# Patient Record
Sex: Male | Born: 1990 | Race: Black or African American | Hispanic: No | Marital: Single | Smoking: Current some day smoker
Health system: Southern US, Community
[De-identification: ages and names within clinical notes are randomized; demographics above are authoritative.]

## PROBLEM LIST (undated history)

## (undated) DIAGNOSIS — F209 Schizophrenia, unspecified: Secondary | ICD-10-CM

---
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
## 2011-10-28 ENCOUNTER — Emergency Department: Payer: Self-pay | Admitting: Emergency Medicine

## 2011-10-28 LAB — COMPREHENSIVE METABOLIC PANEL
Anion Gap: 9 (ref 7–16)
BUN: 13 mg/dL (ref 7–18)
Bilirubin,Total: 0.9 mg/dL (ref 0.2–1.0)
Chloride: 106 mmol/L (ref 98–107)
Co2: 25 mmol/L (ref 21–32)
Creatinine: 1.26 mg/dL (ref 0.60–1.30)
EGFR (African American): >60
EGFR (Non-African Amer.): >60
Osmolality: 279 (ref 275–301)
Potassium: 4.4 mmol/L (ref 3.5–5.1)
Sodium: 140 mmol/L (ref 136–145)
Total Protein: 7.9 g/dL (ref 6.4–8.2)

## 2011-10-28 LAB — CBC
HCT: 49.5 % (ref 40.0–52.0)
MCV: 90 fL (ref 80–100)
Platelet: 195 10*3/uL (ref 150–440)
RBC: 5.53 10*6/uL (ref 4.40–5.90)
RDW: 13.2 % (ref 11.5–14.5)
WBC: 5.1 10*3/uL (ref 3.8–10.6)

## 2011-10-28 LAB — URINALYSIS, COMPLETE
Glucose,UR: NEGATIVE mg/dL (ref 0–75)
Nitrite: NEGATIVE
Ph: 7 (ref 4.5–8.0)
Protein: NEGATIVE
RBC,UR: 1 /HPF (ref 0–5)
Specific Gravity: 1.02 (ref 1.003–1.030)
WBC UR: 6 /HPF (ref 0–5)

## 2012-12-20 ENCOUNTER — Emergency Department: Payer: Self-pay | Admitting: Emergency Medicine

## 2012-12-20 LAB — RAPID INFLUENZA A&B ANTIGENS

## 2012-12-20 LAB — URINALYSIS, COMPLETE
Blood: NEGATIVE
Glucose,UR: NEGATIVE mg/dL (ref 0–75)
Hyaline Cast: 2
Ph: 6 (ref 4.5–8.0)
Protein: 100
RBC,UR: 1 /HPF (ref 0–5)
Specific Gravity: 1.031 (ref 1.003–1.030)
Squamous Epithelial: NONE SEEN

## 2012-12-20 LAB — COMPREHENSIVE METABOLIC PANEL
BUN: 11 mg/dL (ref 7–18)
Bilirubin,Total: 0.6 mg/dL (ref 0.2–1.0)
Chloride: 100 mmol/L (ref 98–107)
Co2: 28 mmol/L (ref 21–32)
EGFR (African American): >60
Osmolality: 274 (ref 275–301)
Sodium: 136 mmol/L (ref 136–145)

## 2012-12-20 LAB — DRUG SCREEN, URINE
Benzodiazepine, Ur Scrn: NEGATIVE (ref ?–200)
Cannabinoid 50 Ng, Ur ~~LOC~~: POSITIVE (ref ?–50)
Cocaine Metabolite,Ur ~~LOC~~: POSITIVE (ref ?–300)
MDMA (Ecstasy)Ur Screen: NEGATIVE (ref ?–500)
Methadone, Ur Screen: NEGATIVE (ref ?–300)
Opiate, Ur Screen: NEGATIVE (ref ?–300)
Tricyclic, Ur Screen: NEGATIVE (ref ?–1000)

## 2012-12-20 LAB — MONONUCLEOSIS SCREEN: Mono Test: NEGATIVE

## 2012-12-20 LAB — CBC
Platelet: 156 10*3/uL (ref 150–440)
RDW: 13.2 % (ref 11.5–14.5)
WBC: 6.9 10*3/uL (ref 3.8–10.6)

## 2012-12-20 LAB — CK: CK, Total: 71 U/L (ref 35–232)

## 2012-12-20 LAB — SALICYLATE LEVEL: Salicylates, Serum: 2.1 mg/dL

## 2012-12-20 LAB — ACETAMINOPHEN LEVEL: Acetaminophen: <2 ug/mL

## 2012-12-22 LAB — URINE CULTURE

## 2012-12-25 LAB — CULTURE, BLOOD (SINGLE)

## 2013-03-11 ENCOUNTER — Emergency Department: Payer: Self-pay | Admitting: Emergency Medicine

## 2014-06-13 NOTE — Consult Note (Signed)
PATIENT NAME:  Bradley Casey, Bradley Casey MR#:  [id number] DATE OF BIRTH:  1990/11/19  DATE OF CONSULTATION:  12/20/2012  REFERRING PHYSICIAN:  Dr. Ella Bodo. CONSULTING PHYSICIAN:  Felipa Furnace, MD  CHIEF COMPLAINT: Weakness and tiredness.   HISTORY OF PRESENT ILLNESS: [name] is a very nice 24 year old gentleman who has history of being healthy, but he uses multiple amounts of drugs and alcohol. He came to the ER with a history of sleeping for 17 to 18 hours yesterday, waking up very thirsty and having significant chills. Apparently, the patient is very active. He uses cocaine on a regular basis, and that actually makes him very awake, but that night he was concerned for going to bed for this long. Whenever he woke up, he was dizzy, lightheaded, and again, very thirsty, for which he felt like something was not normal and decided to come in to the ER.  In the ER, he was evaluated by Dr. Sharma Covert, who wanted to bring him in due to his creatinine of 1.68 and his use of drugs. He was previously seen over here, and his creatinine was 1.26.   The patient states that he does not drink water. He does not really hydrate himself. He is always on the go because he does not have a house. He stays at night with multiple friends, probably 6 houses that he frequents, and he has been doing cocaine and marijuana on a regular basis. The patient does not have a sick contact. He does not have a recent travel history.  He does not have any other major symptoms, for which I cannot pinpoint an infectious site, but this could be a viral syndrome. His white count is normal. He had a normal temperature when he arrived, but it spiked to 102. He does have white blood cells in his urine with trace leukocyte esterase, so suspicion of urinary tract infection versus urethritis from gonorrhea or chlamydia, are possible options. I asked the nurse to do a flu swab.  Overall, the patient does not really want to stay over here; he just  wanted to have some reassurance to go back home. At this moment, he does not meet any significant criteria for admission, for which we are going to discharge him with general recommendations. We had a long talk about the drug use and alcohol use.   REVIEW OF SYSTEMS:  A twelve-system review of systems done. CONSTITUTIONAL: Positive chills. He was not aware of fever, but he did have a fever of 102. Positive fatigue and weakness for the past 24 to 48 hours.  EYES:  No blurry vision, double vision, pain, redness.  EARS, NOSE, THROAT: No difficulty swallowing. No ear pain. No epistaxis.  RESPIRATORY: No cough, wheezing or hemoptysis. No painful respirations.  CARDIOVASCULAR: No chest pain, orthopnea, syncope or edema.  GASTROINTESTINAL: No nausea, vomiting, abdominal pain, constipation or diarrhea.  GENITOURINARY: No dysuria, hematuria or changes in urinary frequency.  ENDOCRINE: No polyuria, polydipsia or polyphagia. His blood sugar is 150, but that could be secondary to stress.  HEMATOLOGIC AND LYMPHATIC: No anemia, easy bruising or swollen glands.  SKIN: No rash or petechiae. The patient is sweaty due to the fever.  MUSCULOSKELETAL: No significant neck pain, back pain or joint pain.  NEUROLOGIC: No numbness, tingling or vertigo.  PSYCHIATRIC: No insomnia or depression.   PAST MEDICAL HISTORY:  Positive for polysubstance abuse.   ALLERGIES: No known drug allergies.   PAST SURGICAL HISTORY: None.   FAMILY HISTORY: Positive for diabetes  in his grandmother. No MI. No CVA. No cancer.   MEDICATIONS: None.   SOCIAL HISTORY: The patient smokes marijuana daily. He uses cocaine very often, in powder form. He does not inject himself. No previous history of IV drug abuse. Positive MDMA use. He lives in at least 4 or 5 different houses, moves here and there. He does not have a house. He is technically homeless, but he always has a roof where to stay. He does not work,   PHYSICAL EXAMINATION: VITAL  SIGNS: Blood pressure 135/75, pulse 101 to 104, respirations 18, temperature 97.8. On admission, 102. After 1 gram of Tylenol, temperature is coming down to 98 to 99.  GENERAL: The patient is alert and oriented x 3, in no acute respiratory distress. Hemodynamically stable.  HEENT: His pupils are equal, round and reactive. Extraocular movements are intact. Mucosa is moist. Anicteric sclerae. Pink conjunctivae. No oral lesions. No oropharyngeal exudates.  NECK: Supple. No JVD. No thyromegaly. No adenopathy. No carotid bruits.  CARDIOVASCULAR: Regular rate and rhythm. No murmurs, rubs or gallops.  LUNGS: Clear without any wheezing or crepitus. No use of accessory muscles.  ABDOMEN: Soft, nontender, nondistended. No hepatosplenomegaly. No masses. Bowel sounds are positive.  GENITOURINARY: Deferred.  EXTREMITIES: No edema, cyanosis or clubbing.  SKIN: No rashes or petechiae. The skin is sweaty/diaphoretic, but no rashes. Turgor is normal.  LYMPHATICS: There is no lymphadenopathy in the neck, supraclavicular, axillary or epitrochlear.  MUSCULOSKELETAL: No joint effusion or joint swelling.  NEUROLOGIC: Cranial nerves intact. No focal findings.  PSYCHIATRIC: The patient is very pleasant. He is not in any significant agitation or changes in  mood.   LABORATORY DATA: Blood sugar 150. Creatinine 1.68, previous creatinine 1.26. Potassium 3.3. LFTs within normal limits. Total CK 71. Cocaine positive. Marijuana positive. White count 6.9, hemoglobin 16. White blood cells 21 in the urine, trace leukocyte esterase. Salicylates is negative. Tylenol level negative.  ASSESSMENT AND PLAN: A 24 year old gentleman with history of malaise and fever.  1.  Fever. This likely is a viral syndrome. Other considerations are urinary tract infection, ureteritis, for which we recommend to treat with appropriate antibiotics. He did receive 1 gram of Rocephin, which should be treatment already gonococcal infection like ureteritis.   He could receive 1 gram of azithromycin x 1 to treat any possible chlamydia. At discharge, he could go with Keflex or Cipro as treatment of urinary tract infection. Other possibility is viral syndrome. The patient is hemodynamically stable. No signs of toxic symptoms. No signs of sepsis. He is tachycardic, likely due to dehydration and cocaine use. His creatinine is elevated at 1.68. His last creatinine is 1.28, but it could be different from baseline now since he has been doing cocaine. Cocaine could create kidney failure. MDMA can cause failure as well, as well as PCP.  He needs to hydrate himself very well.  2. As far as his high glucose goes, this is likely stress but the patient can be rechecked as an outpatient .     RECOMMENDATIONS:   1.  Tylenol p.r.n. fever. Start Tamiflu if flu swab is positive.  2.  Replace potassium.  3.  Drug counseling given. The patient agreed to at least slow down.   I spent about 45 minutes with this patient. He can be discharged home as long as he is hemodynamically stable.    ____________________________ Felipa Furnaceoberto Sanchez Gutierrez, MD rsg:dmm D: 12/20/2012 18:46:00 ET T: 12/20/2012 19:18:23 ET JOB#: [id number]  cc: Felipa Furnaceoberto Sanchez Gutierrez, MD, <Dictator>  Pearletha Furl MD ELECTRONICALLY SIGNED 01/04/2013 23:48

## 2014-08-10 ENCOUNTER — Emergency Department
Admission: EM | Admit: 2014-08-10 | Discharge: 2014-08-10 | Disposition: A | Discharge status: Home / Self Care | Payer: Managed Care, Other (non HMO) | Attending: Emergency Medicine | Admitting: Emergency Medicine

## 2014-08-10 ENCOUNTER — Encounter: Payer: Self-pay | Admitting: Emergency Medicine

## 2014-08-10 DIAGNOSIS — N341 Nonspecific urethritis: Secondary | ICD-10-CM | POA: Diagnosis not present

## 2014-08-10 DIAGNOSIS — M545 Low back pain: Secondary | ICD-10-CM | POA: Diagnosis present

## 2014-08-10 LAB — URINALYSIS COMPLETE WITH MICROSCOPIC (ARMC ONLY)
BILIRUBIN URINE: NEGATIVE
GLUCOSE, UA: NEGATIVE mg/dL
HGB URINE DIPSTICK: NEGATIVE
Ketones, ur: NEGATIVE mg/dL
NITRITE: NEGATIVE
Protein, ur: NEGATIVE mg/dL
SPECIFIC GRAVITY, URINE: 1.014 (ref 1.005–1.030)
SQUAMOUS EPITHELIAL / LPF: NONE SEEN
pH: 8 (ref 5.0–8.0)

## 2014-08-10 LAB — CHLAMYDIA/NGC RT PCR (ARMC ONLY)
CHLAMYDIA TR: NOT DETECTED
N gonorrhoeae: NOT DETECTED

## 2014-08-10 MED ORDER — CEFTRIAXONE SODIUM 250 MG IJ SOLR
INTRAMUSCULAR | Status: AC
  Administered 2014-08-10: 250 mg via INTRAMUSCULAR
  Filled 2014-08-10: qty 250

## 2014-08-10 MED ORDER — CEFTRIAXONE SODIUM 250 MG IJ SOLR
250.0000 mg | Freq: Once | INTRAMUSCULAR | Status: AC
  Administered 2014-08-10: 250 mg via INTRAMUSCULAR

## 2014-08-10 MED ORDER — AZITHROMYCIN 250 MG PO TABS
1000.0000 mg | ORAL_TABLET | Freq: Once | ORAL | Status: AC
  Administered 2014-08-10: 1000 mg via ORAL

## 2014-08-10 MED ORDER — AZITHROMYCIN 250 MG PO TABS
ORAL_TABLET | ORAL | Status: AC
  Administered 2014-08-10: 1000 mg via ORAL
  Filled 2014-08-10: qty 4

## 2014-08-10 MED ORDER — LIDOCAINE HCL (PF) 1 % IJ SOLN
INTRAMUSCULAR | Status: AC
  Administered 2014-08-10: 0.9 mL
  Filled 2014-08-10: qty 5

## 2014-08-10 NOTE — ED Provider Notes (Signed)
[facility]  ____________________________________________  Time seen: 13:07 PM  I have reviewed the triage vital signs and the nursing notes.   HISTORY  Chief Complaint Back Pain    HPI Bradley Casey is a 24 y.o. male is here today with complaint of right "kidney pain". He states that yesterday his urine was cloudy. He has some frequency after drinking a beer. He denies any penile discharge but does admit that he had unprotected sex with a new partner a couple weeks ago. When talking about his flank pain and he points to the posterior lower back. There is been no history of injury. He denies any nausea, vomiting, or fever. In the past has been told that he had possible "kidney failure" but was never referred to a specialist for this. Currently he states his pain is 2 out of 10.   History reviewed. No pertinent past medical history.  There are no active problems to display for this patient.   History reviewed. No pertinent past surgical history.  No current outpatient prescriptions on file.  Allergies Review of patient's allergies indicates no known allergies.  No family history on file.  Social History History  Substance Use Topics  . Smoking status: Never Smoker   . Smokeless tobacco: Not on file  . Alcohol Use: Yes    Review of Systems Constitutional: No fever/chills Eyes: No visual changes. ENT: No sore throat. Cardiovascular: Denies chest pain. Respiratory: Denies shortness of breath. Gastrointestinal: No abdominal pain.  No nausea, no vomiting.  No diarrhea.  No constipation. Genitourinary: Positive for urinary frequency yesterday after drinking beer. Musculoskeletal: Positive for right lower back pain Skin: Negative for rash. Neurological: Negative for headaches, focal weakness or numbness.  10-point ROS otherwise negative.  ____________________________________________   PHYSICAL EXAM:  VITAL  SIGNS: ED Triage Vitals  Enc Vitals Group     BP 08/10/14 1158 129/81 mmHg     Pulse Rate 08/10/14 1158 70     Resp --      Temp 08/10/14 1158 98 F (36.7 C)     Temp Source 08/10/14 1158 Oral     SpO2 08/10/14 1158 100 %     Weight 08/10/14 1158 160 lb (72.576 kg)     Height 08/10/14 1158  (1.803 m)     Head Cir --      Peak Flow --      Pain Score --      Pain Loc --      Pain Edu? --      Excl. in GC? --     Constitutional: Alert and oriented. Well appearing and in no acute distress. Eyes: Conjunctivae are normal. PERRL. EOMI. Head: Atraumatic. Nose: No congestion/rhinnorhea. Neck: No stridor.   Cardiovascular: Normal rate, regular rhythm. Grossly normal heart sounds.  Good peripheral circulation. Respiratory: Normal respiratory effort.  No retractions. Lungs CTAB. Gastrointestinal: Soft and nontender. No distention. No CVA tenderness. Musculoskeletal: Mild tenderness right lower paravertebral muscles. No deformity noted and no restrictions on range of motion. Straight leg raises are negative. No lower extremity tenderness nor edema.  No joint effusions. Neurologic:  Normal speech and language. No gross focal neurologic deficits are appreciated. Speech is normal. No gait instability. Skin:  Skin is warm, dry and intact. No rash noted. Psychiatric: Mood and affect are normal. Speech and behavior are normal.  ____________________________________________   LABS (all labs ordered are listed, but only abnormal results are displayed)  Labs Reviewed  URINALYSIS  COMPLETEWITH MICROSCOPIC ([facility] ONLY) - Abnormal; Notable for the following:    Color, Urine STRAW (*)    APPearance CLEAR (*)    Leukocytes, UA 1+ (*)    Bacteria, UA RARE (*)    All other components within normal limits  CHLAMYDIA/NGC RT PCR ([facility] ONLY)   ____________________________________________ PROCEDURES  Procedure(s) performed: None  Critical Care performed:  No  ____________________________________________   INITIAL IMPRESSION / ASSESSMENT AND PLAN / ED COURSE  Pertinent labs & imaging results that were available during my care of the patient were reviewed by me and considered in my medical decision making (see chart for details).  Patient was treated for urethritis secondary to STD. Patient was informed that his gonorrhea and chlamydia test would not be back for another 3 hours. He is aware that he is getting the antibiotic so and needs to follow-up after 10 days ____________________________________________   FINAL CLINICAL IMPRESSION(S) / ED DIAGNOSES  Final diagnoses:  Urethritis, nonspecific      Tommi Rumps, PA-C 08/10/14 1529  Tommi Rumps, PA-C 08/10/14 1532  Minna Antis, MD 08/11/14 Barry Brunner

## 2014-08-10 NOTE — ED Notes (Signed)
Patient presents to the ED with slight "tightness" in his lower back and cloudy urine since yesterday.  Patient reports history of UTI and kidney problems.  Patient is in no obvious distress at this time.  Patient texting on cell phone during triage.  Patient states he is most concerned with his cloudy urine.

## 2014-08-10 NOTE — ED Notes (Signed)
Pt complaining of right flank pain/ kidney pain. Pt concerned of possible kidney failure at prior exam years ago they stated he was having kidney problems, pt states his urine was cloudy yesterday , denies any other urinary symptoms , no penile discharge , does admit to having unprotected sex with a new partner " a couple of weeks ago"

## 2014-08-10 NOTE — Discharge Instructions (Signed)
Urethritis °Urethritis is an inflammation of the tube through which urine exits your bladder (urethra).  °CAUSES °Urethritis is often caused by an infection in your urethra. The infection can be viral, like herpes. The infection can also be bacterial, like gonorrhea. °RISK FACTORS °Risk factors of urethritis include: °· Having sex without using a condom. °· Having multiple sexual partners. °· Having poor hygiene. °SIGNS AND SYMPTOMS °Symptoms of urethritis are less noticeable in women than in men. These symptoms include: °· Burning feeling when you urinate (dysuria). °· Discharge from your urethra. °· Blood in your urine (hematuria). °· Urinating more than usual. °DIAGNOSIS  °To confirm a diagnosis of urethritis, your health care provider will do the following: °· Ask about your sexual history. °· Perform a physical exam. °· Have you provide a sample of your urine for lab testing. °· Use a cotton swab to gently collect a sample from your urethra for lab testing. °TREATMENT  °It is important to treat urethritis. Depending on the cause, untreated urethritis may lead to serious genital infections and possibly infertility. Urethritis caused by a bacterial infection is treated with antibiotic medicine. All sexual partners must be treated.  °HOME CARE INSTRUCTIONS °· Do not have sex until the test results are known and treatment is completed, even if your symptoms go away before you finish treatment. °· If you were prescribed an antibiotic, finish it all even if you start to feel better. °SEEK MEDICAL CARE IF:  °· Your symptoms are not improved in 3 days. °· Your symptoms are getting worse. °· You develop abdominal pain or pelvic pain (in women). °· You develop joint pain. °· You have a fever. °SEEK IMMEDIATE MEDICAL CARE IF:  °· You have severe pain in the belly, back, or side. °· You have repeated vomiting. °MAKE SURE YOU: °· Understand these instructions. °· Will watch your condition. °· Will get help right away if you  are not doing well or get worse. °Document Released: 08/03/2000 Document Revised: 06/24/2013 Document Reviewed: 10/08/2012 °[employer]® Patient Information ©2015 [employer]. This information is not intended to replace advice given to you by your health care provider. Make sure you discuss any questions you have with your health care provider. ° °

## 2015-01-04 ENCOUNTER — Ambulatory Visit
Admission: EM | Admit: 2015-01-04 | Discharge: 2015-01-04 | Disposition: A | Discharge status: Home / Self Care | Payer: Managed Care, Other (non HMO) | Attending: Family Medicine | Admitting: Family Medicine

## 2015-01-04 ENCOUNTER — Ambulatory Visit (INDEPENDENT_AMBULATORY_CARE_PROVIDER_SITE_OTHER): Payer: Managed Care, Other (non HMO)

## 2015-01-04 ENCOUNTER — Encounter: Payer: Self-pay | Admitting: *Deleted

## 2015-01-04 DIAGNOSIS — S8010XA Contusion of unspecified lower leg, initial encounter: Secondary | ICD-10-CM | POA: Diagnosis not present

## 2015-01-04 DIAGNOSIS — S40029A Contusion of unspecified upper arm, initial encounter: Secondary | ICD-10-CM

## 2015-01-04 DIAGNOSIS — S51821A Laceration with foreign body of right forearm, initial encounter: Secondary | ICD-10-CM | POA: Diagnosis not present

## 2015-01-04 MED ORDER — CEFUROXIME AXETIL 500 MG PO TABS: 500.0000 mg | ORAL_TABLET | Freq: Two times a day (BID) | ORAL | Status: DC

## 2015-01-04 MED ORDER — MUPIROCIN 2 % EX OINT: 1.0000 "application " | TOPICAL_OINTMENT | Freq: Three times a day (TID) | CUTANEOUS | Status: DC
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
## 2015-01-04 NOTE — Discharge Instructions (Signed)
Contusion A contusion is a deep bruise. Contusions happen when an injury causes bleeding under the skin. Symptoms of bruising include pain, swelling, and discolored skin. The skin may turn blue, purple, or yellow. HOME CARE   Rest the injured area.  If told, put ice on the injured area.  Put ice in a plastic bag.  Place a towel between your skin and the bag.  Leave the ice on for 20 minutes, 2-3 times per day.  If told, put light pressure (compression) on the injured area using an elastic bandage. Make sure the bandage is not too tight. Remove it and put it back on as told by your doctor.  If possible, raise (elevate) the injured area above the level of your heart while you are sitting or lying down.  Take over-the-counter and prescription medicines only as told by your doctor. GET HELP IF:  Your symptoms do not get better after several days of treatment.  Your symptoms get worse.  You have trouble moving the injured area. GET HELP RIGHT AWAY IF:   You have very bad pain.  You have a loss of feeling (numbness) in a hand or foot.  Your hand or foot turns pale or cold.   This information is not intended to replace advice given to you by your health care provider. Make sure you discuss any questions you have with your health care provider.   Document Released: 07/27/2007 Document Revised: 10/29/2014 Document Reviewed: 06/25/2014 Elsevier Interactive Patient Education 2016 [employer]  Delayed Wound Closure Sometimes, your health care provider will decide to delay closing a wound for several days. This is done when the wound is badly bruised, dirty, or when it has been several hours since the injury happened. By delaying the closure of your wound, the risk of infection is reduced. Wounds that are closed in 3-7 days after being cleaned up and dressed heal just as well as those that are closed right away. HOME CARE INSTRUCTIONS  Rest and elevate the injured area until the pain  and swelling are gone.  Have your wound checked as instructed by your health care provider. SEEK MEDICAL CARE IF:  You develop unusual or increased swelling or redness around the wound.  You have increasing pain or tenderness.  There is increasing fluid (drainage) or a bad smelling drainage coming from the wound.   This information is not intended to replace advice given to you by your health care provider. Make sure you discuss any questions you have with your health care provider.   Document Released: 02/07/2005 Document Revised: 02/12/2013 Document Reviewed: 08/07/2012 Elsevier Interactive Patient Education 2016 [employer]  Nonsutured Laceration Care A laceration is a cut that goes through all layers of the skin and extends into the tissue that is right under the skin. This type of cut is usually stitched up (sutured) or closed with tape (adhesive strips) or skin glue shortly after the injury happens. However, if the wound is dirty or if several hours pass before medical treatment is provided, it is likely that germs (bacteria) will enter the wound. Closing a laceration after bacteria have entered it increases the risk of infection. In these cases, your health care provider may leave the laceration open (nonsutured) and cover it with a bandage. This type of treatment helps prevent infection and allows the wound to heal from the deepest layer of tissue damage up to the surface. An open fracture is a type of injury that may involve nonsutured lacerations. An  open fracture is a break in a bone that happens along with one or more lacerations through the skin that is near the fracture site. HOW TO CARE FOR YOUR NONSUTURED LACERATION  Take or apply over-the-counter and prescription medicines only as told by your health care provider.  If you were prescribed an antibiotic medicine, take or apply it as told by your health care provider. Do not stop using the antibiotic even if your condition  improves.  Clean the wound one time each day or as told by your health care provider.  Wash the wound with mild soap and water.  Rinse the wound with water to remove all soap.  Pat your wound dry with a clean towel. Do not rub the wound.  Do not inject anything into the wound unless your health care provider told you to.  Change any bandages (dressings) as told by your health care provider. This includes changing the dressing if it gets wet, dirty, or starts to smell bad.  Keep the dressing dry until your health care provider says it can be removed. Do not take baths, swim, or do anything that puts your wound underwater until your health care provider approves.  Raise (elevate) the injured area above the level of your heart while you are sitting or lying down, if possible.  Do not scratch or pick at the wound.  Check your wound every day for signs of infection. Watch for:  Redness, swelling, or pain.  Fluid, blood, or pus.  Keep all follow-up visits as told by your health care provider. This is important. SEEK MEDICAL CARE IF:  You received a tetanus and shot and you have swelling, severe pain, redness, or bleeding at the injection site.   You have a fever.  Your pain is not controlled with medicine.  You have increased redness, swelling, or pain at the site of your wound.  You have fluid, blood, or pus coming from your wound.  You notice a bad smell coming from your wound or your dressing.  You notice something coming out of the wound, such as wood or glass.  You notice a change in the color of your skin near your wound.  You develop a new rash.  You need to change the dressing frequently due to fluid, blood, or pus draining from the wound.  You develop numbness around your wound. SEEK IMMEDIATE MEDICAL CARE IF:  Your pain suddenly increases and is severe.  You develop severe swelling around the wound.  The wound is on your hand or foot and you cannot  properly move a finger or toe.  The wound is on your hand or foot and you notice that your fingers or toes look pale or bluish.  You have a red streak going away from your wound.   This information is not intended to replace advice given to you by your health care provider. Make sure you discuss any questions you have with your health care provider.   Document Released: 01/05/2006 Document Revised: 06/24/2014 Document Reviewed: 02/03/2014 Elsevier Interactive Patient Education [employer]

## 2015-07-14 ENCOUNTER — Encounter: Payer: Self-pay | Admitting: *Deleted

## 2015-07-14 ENCOUNTER — Emergency Department
Admission: EM | Admit: 2015-07-14 | Discharge: 2015-07-14 | Disposition: A | Discharge status: Home / Self Care | Payer: Managed Care, Other (non HMO) | Attending: Emergency Medicine | Admitting: Emergency Medicine

## 2015-07-14 DIAGNOSIS — R112 Nausea with vomiting, unspecified: Secondary | ICD-10-CM | POA: Diagnosis present

## 2015-07-14 DIAGNOSIS — Z79899 Other long term (current) drug therapy: Secondary | ICD-10-CM | POA: Insufficient documentation

## 2015-07-14 DIAGNOSIS — F1290 Cannabis use, unspecified, uncomplicated: Secondary | ICD-10-CM | POA: Diagnosis not present

## 2015-07-14 DIAGNOSIS — K529 Noninfective gastroenteritis and colitis, unspecified: Secondary | ICD-10-CM | POA: Diagnosis not present

## 2015-07-14 DIAGNOSIS — A64 Unspecified sexually transmitted disease: Secondary | ICD-10-CM | POA: Insufficient documentation

## 2015-07-14 LAB — COMPREHENSIVE METABOLIC PANEL
ALT: 21 U/L (ref 17–63)
ANION GAP: 12 (ref 5–15)
AST: 34 U/L (ref 15–41)
Albumin: 4.7 g/dL (ref 3.5–5.0)
Alkaline Phosphatase: 73 U/L (ref 38–126)
BUN: 10 mg/dL (ref 6–20)
CHLORIDE: 100 mmol/L — AB (ref 101–111)
CO2: 22 mmol/L (ref 22–32)
Calcium: 9.7 mg/dL (ref 8.9–10.3)
Creatinine, Ser: 1.55 mg/dL — ABNORMAL HIGH (ref 0.61–1.24)
GFR calc Af Amer: >60 mL/min (ref >60)
GFR calc non Af Amer: >60 mL/min (ref >60)
GLUCOSE: 176 mg/dL — AB (ref 65–99)
Potassium: 3.1 mmol/L — ABNORMAL LOW (ref 3.5–5.1)
SODIUM: 134 mmol/L — AB (ref 135–145)
Total Bilirubin: 1 mg/dL (ref 0.3–1.2)
Total Protein: 8 g/dL (ref 6.5–8.1)

## 2015-07-14 LAB — URINALYSIS COMPLETE WITH MICROSCOPIC (ARMC ONLY)
BACTERIA UA: NONE SEEN
GLUCOSE, UA: NEGATIVE mg/dL
Hgb urine dipstick: NEGATIVE
Leukocytes, UA: NEGATIVE
Nitrite: NEGATIVE
Protein, ur: 100 mg/dL — AB
SPECIFIC GRAVITY, URINE: 1.032 — AB (ref 1.005–1.030)
pH: 5 (ref 5.0–8.0)

## 2015-07-14 LAB — CBC
HCT: 49.5 % (ref 40.0–52.0)
Hemoglobin: 17 g/dL (ref 13.0–18.0)
MCH: 29.1 pg (ref 26.0–34.0)
MCHC: 34.4 g/dL (ref 32.0–36.0)
MCV: 84.8 fL (ref 80.0–100.0)
Platelets: 201 10*3/uL (ref 150–440)
RBC: 5.83 MIL/uL (ref 4.40–5.90)
RDW: 13.1 % (ref 11.5–14.5)
WBC: 5.8 10*3/uL (ref 3.8–10.6)

## 2015-07-14 LAB — LIPASE, BLOOD: Lipase: 29 U/L (ref 11–51)

## 2015-07-14 MED ORDER — GI COCKTAIL ~~LOC~~
30.0000 mL | Freq: Once | ORAL | Status: AC
  Administered 2015-07-14: 30 mL via ORAL

## 2015-07-14 MED ORDER — ONDANSETRON HCL 4 MG PO TABS: 4.0000 mg | ORAL_TABLET | Freq: Every day | ORAL | Status: DC | PRN

## 2015-07-14 MED ORDER — CEFTRIAXONE SODIUM 250 MG IJ SOLR
250.0000 mg | Freq: Once | INTRAMUSCULAR | Status: AC
  Administered 2015-07-14: 250 mg via INTRAMUSCULAR
  Filled 2015-07-14: qty 250

## 2015-07-14 MED ORDER — LIDOCAINE HCL (PF) 1 % IJ SOLN
INTRAMUSCULAR | Status: AC
  Administered 2015-07-14: 1 mL
  Filled 2015-07-14: qty 5

## 2015-07-14 MED ORDER — ONDANSETRON 4 MG PO TBDP
ORAL_TABLET | ORAL | Status: AC
  Filled 2015-07-14: qty 1

## 2015-07-14 MED ORDER — AZITHROMYCIN 500 MG PO TABS
1000.0000 mg | ORAL_TABLET | Freq: Once | ORAL | Status: AC
  Administered 2015-07-14: 1000 mg via ORAL
  Filled 2015-07-14: qty 2

## 2015-07-14 MED ORDER — ONDANSETRON 4 MG PO TBDP
4.0000 mg | ORAL_TABLET | Freq: Once | ORAL | Status: AC
  Administered 2015-07-14: 4 mg via ORAL

## 2015-07-14 MED ORDER — GI COCKTAIL ~~LOC~~
ORAL | Status: AC
  Filled 2015-07-14: qty 30

## 2015-07-14 NOTE — ED Notes (Signed)
Pt states he ate a mushroom to get high about 2-3 days ago and has had no appetite, not eating.  Denies pain, N/V/d.Marland Kitchen. Pt also states "I think I have an STD"..Marland Kitchen

## 2015-07-14 NOTE — ED Provider Notes (Signed)
[facility] Emergency Department Provider Note  ____________________________________________    I have reviewed the triage vital signs and the nursing notes.   HISTORY  Chief Complaint Exposure to STD and Ingestion    HPI Bradley Casey is a 25 y.o. male who presents with complaints of nausea vomiting and diarrhea. He feels this is related to trying mushrooms 3 days ago. Since then he is not been tolerating by mouth's. He denies abdominal pain. No fevers or chills. No sick contacts. He also reports mild discharge and suspects an STD. No scrotal swelling or pain.     History reviewed. No pertinent past medical history.  There are no active problems to display for this patient.   History reviewed. No pertinent past surgical history.  Current Outpatient Rx  Name  Route  Sig  Dispense  Refill  . cefUROXime (CEFTIN) 500 MG tablet   Oral   Take 1 tablet (500 mg total) by mouth 2 (two) times daily.   20 tablet   0   . mupirocin ointment (BACTROBAN) 2 %   Topical   Apply 1 application topically 3 (three) times daily.   22 g   0   . ondansetron (ZOFRAN) 4 MG tablet   Oral   Take 1 tablet (4 mg total) by mouth daily as needed for nausea or vomiting.   20 tablet   1     Allergies Review of patient's allergies indicates no known allergies.  No family history on file.  Social History Social History  Substance Use Topics  . Smoking status: Never Smoker   . Smokeless tobacco: None  . Alcohol Use: Yes    Review of Systems  Constitutional: Negative for fever. Eyes: Negative for redness ENT: Negative for sore throat Cardiovascular: Negative for chest pain Respiratory: Negative for Cough Gastrointestinal: As above Genitourinary: Negative for dysuria.Penile discharge Musculoskeletal: Negative for back pain. Skin: Negative for rash. Neurological: Negative for headache Psychiatric: no  anxiety    ____________________________________________   PHYSICAL EXAM:  VITAL SIGNS: ED Triage Vitals  Enc Vitals Group     BP 07/14/15 1151 173/80 mmHg     Pulse Rate 07/14/15 1151 88     Resp 07/14/15 1151 18     Temp 07/14/15 1151 98 F (36.7 C)     Temp Source 07/14/15 1151 Oral     SpO2 07/14/15 1151 98 %     Weight 07/14/15 1151 170 lb (77.111 kg)     Height 07/14/15 1151 6' (1.829 m)     Head Cir --      Peak Flow --      Pain Score 07/14/15 1251 4     Pain Loc --      Pain Edu? --      Excl. in GC? --      Constitutional: Alert and oriented. Well appearing and in no distress.  Eyes: Conjunctivae are normal. No erythema or injection ENT   Head: Normocephalic and atraumatic.   Mouth/Throat: Mucous membranes are moist. Cardiovascular: Normal rate, regular rhythm. Normal and symmetric distal pulses are present in the upper extremities. No murmurs or rubs  Respiratory: Normal respiratory effort without tachypnea nor retractions. Breath sounds are clear and equal bilaterally.  Gastrointestinal: Soft and non-tender in all quadrants. No distention. There is no CVA tenderness. Genitourinary: Normal scrotum, normal penis Musculoskeletal: Nontender with normal range of motion in all extremities. No lower extremity tenderness nor edema. Neurologic:  Normal speech and language. No gross focal  neurologic deficits are appreciated. Skin:  Skin is warm, dry and intact. No rash noted. Psychiatric: Mood and affect are normal. Patient exhibits appropriate insight and judgment.  ____________________________________________    LABS (pertinent positives/negatives)  Labs Reviewed  COMPREHENSIVE METABOLIC PANEL - Abnormal; Notable for the following:    Sodium 134 (*)    Potassium 3.1 (*)    Chloride 100 (*)    Glucose, Bld 176 (*)    Creatinine, Ser 1.55 (*)    All other components within normal limits  URINALYSIS COMPLETEWITH MICROSCOPIC ([facility] ONLY) - Abnormal;  Notable for the following:    Color, Urine AMBER (*)    APPearance HAZY (*)    Bilirubin Urine 1+ (*)    Ketones, ur TRACE (*)    Specific Gravity, Urine 1.032 (*)    Protein, ur 100 (*)    Squamous Epithelial / LPF 0-5 (*)    All other components within normal limits  CHLAMYDIA/NGC RT PCR ([facility] ONLY)  LIPASE, BLOOD  CBC    ____________________________________________   EKG  None  ____________________________________________    RADIOLOGY  None  ____________________________________________   PROCEDURES  Procedure(s) performed: none  Critical Care performed: none  ____________________________________________   INITIAL IMPRESSION / ASSESSMENT AND PLAN / ED COURSE  Pertinent labs & imaging results that were available during my care of the patient were reviewed by me and considered in my medical decision making (see chart for details).  Patient well-appearing no distress, no abdominal tenderness palpation. Lab work not significant change from prior tests, notified patient of mildly elevated creatinine and recommended outpatient follow-up. We treated the patient with and by mouth Zofran and GI cocktail which improved his symptoms. We also treated him with Rocephin and azithromycin for suspected STD, urine tests sent, recommend follow-up with health Department as well  ____________________________________________   FINAL CLINICAL IMPRESSION(S) / ED DIAGNOSES  Final diagnoses:  Gastroenteritis  STD (male)          Bradley Everyobert Kamerin Grumbine, MD 07/14/15 1644

## 2015-07-14 NOTE — ED Notes (Signed)
Pt tried "mushroom" 3 days ago since than pt reports abdominal pain and being unable to eat, pt also reports having a STD, pt complains of dysuria with a clear discharge from penis

## 2015-07-14 NOTE — Discharge Instructions (Signed)
Sexually Transmitted Disease °A sexually transmitted disease (STD) is a disease or infection that may be passed (transmitted) from person to person, usually during sexual activity. This may happen by way of saliva, semen, blood, vaginal mucus, or urine. Common STDs include: °· Gonorrhea. °· Chlamydia. °· Syphilis. °· HIV and AIDS. °· Genital herpes. °· Hepatitis B and C. °· Trichomonas. °· Human papillomavirus (HPV). °· Pubic lice. °· Scabies. °· Mites. °· Bacterial vaginosis. °WHAT ARE CAUSES OF STDs? °An STD may be caused by bacteria, a virus, or parasites. STDs are often transmitted during sexual activity if one person is infected. However, they may also be transmitted through nonsexual means. STDs may be transmitted after:  °· Sexual intercourse with an infected person. °· Sharing sex toys with an infected person. °· Sharing needles with an infected person or using unclean piercing or tattoo needles. °· Having intimate contact with the genitals, mouth, or rectal areas of an infected person. °· Exposure to infected fluids during birth. °WHAT ARE THE SIGNS AND SYMPTOMS OF STDs? °Different STDs have different symptoms. Some people may not have any symptoms. If symptoms are present, they may include: °· Painful or bloody urination. °· Pain in the pelvis, abdomen, vagina, anus, throat, or eyes. °· A skin rash, itching, or irritation. °· Growths, ulcerations, blisters, or sores in the genital and anal areas. °· Abnormal vaginal discharge with or without bad odor. °· Penile discharge in men. °· Fever. °· Pain or bleeding during sexual intercourse. °· Swollen glands in the groin area. °· Yellow skin and eyes (jaundice). This is seen with hepatitis. °· Swollen testicles. °· Infertility. °· Sores and blisters in the mouth. °HOW ARE STDs DIAGNOSED? °To make a diagnosis, your health care provider may: °· Take a medical history. °· Perform a physical exam. °· Take a sample of any discharge to examine. °· Swab the throat,  cervix, opening to the penis, rectum, or vagina for testing. °· Test a sample of your first morning urine. °· Perform blood tests. °· Perform a Pap test, if this applies. °· Perform a colposcopy. °· Perform a laparoscopy. °HOW ARE STDs TREATED? °Treatment depends on the STD. Some STDs may be treated but not cured. °· Chlamydia, gonorrhea, trichomonas, and syphilis can be cured with antibiotic medicine. °· Genital herpes, hepatitis, and HIV can be treated, but not cured, with prescribed medicines. The medicines lessen symptoms. °· Genital warts from HPV can be treated with medicine or by freezing, burning (electrocautery), or surgery. Warts may come back. °· HPV cannot be cured with medicine or surgery. However, abnormal areas may be removed from the cervix, vagina, or vulva. °· If your diagnosis is confirmed, your recent sexual partners need treatment. This is true even if they are symptom-free or have a negative culture or evaluation. They should not have sex until their health care providers say it is okay. °· Your health care provider may test you for infection again 3 months after treatment. °HOW CAN I REDUCE MY RISK OF GETTING AN STD? °Take these steps to reduce your risk of getting an STD: °· Use latex condoms, dental dams, and water-soluble lubricants during sexual activity. Do not use petroleum jelly or oils. °· Avoid having multiple sex partners. °· Do not have sex with someone who has other sex partners °· Do not have sex with anyone you do not know or who is at high risk for an STD. °· Avoid risky sex practices that can break your skin. °· Do not have sex   if you have open sores on your mouth or skin. °· Avoid drinking too much alcohol or taking illegal drugs. Alcohol and drugs can affect your judgment and put you in a vulnerable position. °· Avoid engaging in oral and anal sex acts. °· Get vaccinated for HPV and hepatitis. If you have not received these vaccines in the past, talk to your health care  provider about whether one or both might be right for you. °· If you are at risk of being infected with HIV, it is recommended that you take a prescription medicine daily to prevent HIV infection. This is called pre-exposure prophylaxis (PrEP). You are considered at risk if: °¨ You are a man who has sex with other men (MSM). °¨ You are a heterosexual man or woman and are sexually active with more than one partner. °¨ You take drugs by injection. °¨ You are sexually active with a partner who has HIV. °· Talk with your health care provider about whether you are at high risk of being infected with HIV. If you choose to begin PrEP, you should first be tested for HIV. You should then be tested every 3 months for as long as you are taking PrEP. °WHAT SHOULD I DO IF I THINK I HAVE AN STD? °· See your health care provider. °· Tell your sexual partner(s). They should be tested and treated for any STDs. °· Do not have sex until your health care provider says it is okay. °WHEN SHOULD I GET IMMEDIATE MEDICAL CARE? °Contact your health care provider right away if:  °· You have severe abdominal pain. °· You are a man and notice swelling or pain in your testicles. °· You are a woman and notice swelling or pain in your vagina. °  °This information is not intended to replace advice given to you by your health care provider. Make sure you discuss any questions you have with your health care provider. °  °Document Released: 04/30/2002 Document Revised: 02/28/2014 Document Reviewed: 08/28/2012 °Elsevier Interactive Patient Education ©2016 Elsevier Inc. ° °

## 2015-07-15 LAB — CHLAMYDIA/NGC RT PCR (ARMC ONLY)
CHLAMYDIA TR: NOT DETECTED
N GONORRHOEAE: NOT DETECTED

## 2016-10-14 ENCOUNTER — Emergency Department: Payer: 59

## 2016-10-14 ENCOUNTER — Emergency Department
Admission: EM | Admit: 2016-10-14 | Discharge: 2016-10-15 | Disposition: A | Discharge status: Home / Self Care | Payer: 59 | Attending: Emergency Medicine | Admitting: Emergency Medicine

## 2016-10-14 ENCOUNTER — Encounter: Payer: Self-pay | Admitting: Emergency Medicine

## 2016-10-14 DIAGNOSIS — F1410 Cocaine abuse, uncomplicated: Secondary | ICD-10-CM | POA: Insufficient documentation

## 2016-10-14 DIAGNOSIS — Z202 Contact with and (suspected) exposure to infections with a predominantly sexual mode of transmission: Secondary | ICD-10-CM | POA: Diagnosis not present

## 2016-10-14 DIAGNOSIS — R079 Chest pain, unspecified: Secondary | ICD-10-CM | POA: Diagnosis present

## 2016-10-14 LAB — BASIC METABOLIC PANEL
ANION GAP: 7 (ref 5–15)
BUN: 21 mg/dL — ABNORMAL HIGH (ref 6–20)
CALCIUM: 9.8 mg/dL (ref 8.9–10.3)
CO2: 28 mmol/L (ref 22–32)
Chloride: 104 mmol/L (ref 101–111)
Creatinine, Ser: 1.22 mg/dL (ref 0.61–1.24)
Glucose, Bld: 97 mg/dL (ref 65–99)
Potassium: 4.1 mmol/L (ref 3.5–5.1)
Sodium: 139 mmol/L (ref 135–145)

## 2016-10-14 LAB — CBC
HCT: 47.8 % (ref 40.0–52.0)
HEMOGLOBIN: 16.2 g/dL (ref 13.0–18.0)
MCH: 29.9 pg (ref 26.0–34.0)
MCHC: 33.8 g/dL (ref 32.0–36.0)
MCV: 88.4 fL (ref 80.0–100.0)
Platelets: 193 10*3/uL (ref 150–440)
RBC: 5.41 MIL/uL (ref 4.40–5.90)
RDW: 13.8 % (ref 11.5–14.5)
WBC: 8.4 10*3/uL (ref 3.8–10.6)

## 2016-10-14 LAB — TROPONIN I

## 2016-10-14 NOTE — ED Triage Notes (Signed)
Pt comes into the ED via POV c/o chest pain on the left side of the chest that also wraps around to his rib cage.  Patient denies any chest pain, nausea, or shortness of breath.  Patient in NAD at this time with even and unlabored respirations.  Patient states he did use cocaine last night for the first time in 2 months.  Patient explains that pain has been there for a couple of months but got significantly worse today.

## 2016-10-15 LAB — CHLAMYDIA/NGC RT PCR (ARMC ONLY)
Chlamydia Tr: NOT DETECTED
N gonorrhoeae: NOT DETECTED

## 2016-10-15 LAB — TROPONIN I

## 2016-10-15 MED ORDER — GI COCKTAIL ~~LOC~~
30.0000 mL | Freq: Once | ORAL | Status: AC
  Administered 2016-10-15: 30 mL via ORAL
  Filled 2016-10-15: qty 30

## 2016-10-15 NOTE — ED Notes (Signed)

## 2016-10-15 NOTE — Discharge Instructions (Signed)
Please follow up with the acute care clinic °

## 2016-10-15 NOTE — ED Provider Notes (Signed)
[facility] Emergency Department Provider Note   ____________________________________________   First MD Initiated Contact with Patient 10/14/16 2348     (approximate)  I have reviewed the triage vital signs and the nursing notes.   HISTORY  Chief Complaint Chest Pain    HPI Bradley Casey is a 26 y.o. male who comes into the hospital today with some chest tightness. The patient states it is getting tighter and tighter. The symptoms started one month ago but has been intermittent. Today he's been having some squeezing the swelling kind of bad. He didn't take anything for his pain at home. The patient denies any shortness of breath, nausea, vomiting, sweats. He's had some mild lightheadedness and reports that it radiates from his left chest around to his side. The patient states that it goes away and comes back. Currently his pain is a 3-4 out of 10 in intensity. He states that it is weird but is improved. He states that he did use cocaine last night but he didn't think it had anything to do with his pain. He reports that the last time he used was in June and the pain started way after that.The patient is here today for evaluation.   History reviewed. No pertinent past medical history.  There are no active problems to display for this patient.   History reviewed. No pertinent surgical history.  Prior to Admission medications   Medication Sig Start Date End Date Taking? Authorizing Provider  cefUROXime (CEFTIN) 500 MG tablet Take 1 tablet (500 mg total) by mouth 2 (two) times daily. 01/04/15   Hassan Rowan, MD  mupirocin ointment (BACTROBAN) 2 % Apply 1 application topically 3 (three) times daily. 01/04/15   Hassan Rowan, MD  ondansetron (ZOFRAN) 4 MG tablet Take 1 tablet (4 mg total) by mouth daily as needed for nausea or vomiting. 07/14/15   Jene Every, MD    Allergies Patient has no known allergies.  No family history on file.  Social  History Social History  Substance Use Topics  . Smoking status: Never Smoker  . Smokeless tobacco: Never Used  . Alcohol use Yes    Review of Systems  Constitutional: No fever/chills Eyes: No visual changes. ENT: No sore throat. Cardiovascular: chest pain. Respiratory: Denies shortness of breath. Gastrointestinal: No abdominal pain.  No nausea, no vomiting.  No diarrhea.  No constipation. Genitourinary: Negative for dysuria. Musculoskeletal: Negative for back pain. Skin: Negative for rash. Neurological: lightheadedness   ____________________________________________   PHYSICAL EXAM:  VITAL SIGNS: ED Triage Vitals  Enc Vitals Group     BP 10/14/16 1920 (!) 145/94     Pulse Rate 10/14/16 1920 87     Resp 10/14/16 1920 17     Temp 10/14/16 1920 98.3 F (36.8 C)     Temp Source 10/14/16 1920 Oral     SpO2 10/14/16 1920 96 %     Weight 10/14/16 1917 175 lb (79.4 kg)     Height 10/14/16 1917 6' (1.829 m)     Head Circumference --      Peak Flow --      Pain Score 10/14/16 1917 6     Pain Loc --      Pain Edu? --      Excl. in GC? --     Constitutional: Alert and oriented. Well appearing and in mild distress. Eyes: Conjunctivae are normal. PERRL. EOMI. Head: Atraumatic. Nose: No congestion/rhinnorhea. Mouth/Throat: Mucous membranes are moist.  Oropharynx non-erythematous. Cardiovascular: Normal  rate, regular rhythm. Grossly normal heart sounds.  Good peripheral circulation. Respiratory: Normal respiratory effort.  No retractions. Lungs CTAB. Gastrointestinal: Soft and nontender. No distention.positive bowel sounds Musculoskeletal: No lower extremity tenderness nor edema.  Neurologic:  Normal speech and language.  Skin:  Skin is warm, dry and intact.  Psychiatric: Mood and affect are normal.   ____________________________________________   LABS (all labs ordered are listed, but only abnormal results are displayed)  Labs Reviewed  BASIC METABOLIC PANEL -  Abnormal; Notable for the following:       Result Value   BUN 21 (*)    All other components within normal limits  CHLAMYDIA/NGC RT PCR ([facility] ONLY)  CBC  TROPONIN I  TROPONIN I   ____________________________________________  EKG  ED ECG REPORT I, Rebecka Apley, the attending physician, personally viewed and interpreted this ECG.   Date: 10/14/2016  EKG Time: 1916  Rate: 81  Rhythm: normal sinus rhythm  Axis: normal  Intervals:none  ST&T Change: flipped t wave in lead III present in 2014  ____________________________________________  RADIOLOGY  Dg Chest 2 View  Result Date: 10/14/2016 CLINICAL DATA:  Left chest pain. EXAM: CHEST  2 VIEW COMPARISON:  None. FINDINGS: The heart size and mediastinal contours are normal. The lungs are clear. There is no pleural effusion or pneumothorax. No acute osseous findings are identified. IMPRESSION: No active cardiopulmonary process. Electronically Signed   By: Carey Bullocks M.D.   On: 10/14/2016 19:38    ____________________________________________   PROCEDURES  Procedure(s) performed: None  Procedures  Critical Care performed: No  ____________________________________________   INITIAL IMPRESSION / ASSESSMENT AND PLAN / ED COURSE  Pertinent labs & imaging results that were available during my care of the patient were reviewed by me and considered in my medical decision making (see chart for details).   This is a 26 year old male who comes into the hospital today with some chest pain. The patient had some blood work drawn and it was unremarkable. I did give the patient a GI cocktail and repeated his troponin and it was negative. The patient's x-ray is also unremarkable. I did inform the patient that some of this may be due to his cocaine use. He denies a correlation. The patient also then did ask to be STD tested. He denies any symptoms but reports that the last time he was here he was treated for an STD. I did  inform the patient that I would test his urine but I would not treat him without positive results and no symptoms. The patient will be discharged home to follow-up with the acute care clinic and cardiology.      ____________________________________________   FINAL CLINICAL IMPRESSION(S) / ED DIAGNOSES  Final diagnoses:  Chest pain, unspecified type  Possible exposure to STD      NEW MEDICATIONS STARTED DURING THIS VISIT:  New Prescriptions   No medications on file     Note:  This document was prepared using Dragon voice recognition software and may include unintentional dictation errors.    Rebecka Apley, MD 10/15/16 [phone]

## 2017-05-30 ENCOUNTER — Other Ambulatory Visit
Admit: 2017-05-30 | Discharge: 2017-05-30 | Disposition: A | Discharge status: Home / Self Care | Attending: Family Medicine | Admitting: Family Medicine

## 2017-05-30 NOTE — ED Notes (Addendum)
Patient ambulatory to triage with steady gait, without difficulty or distress noted, in custody of [place] [employer] officer [place] for forensic blood draw; pt A&Ox3, calm & cooperative, with no c/o voiced and denies need to see ED provider; pt voices good understanding of blood draw to be performed for forensic testing as required by subpoena presented and reviewed by officer Pride; no consent form completed as pt refused blood draw upon initial presentation; pt verifies identity with name and DOB; using sealed kit provided by officer Pride, tourniquet applied to right upper arm; right antecubital region prepped with betadine swab and allowed to dry completely; needle inserted and 2 grey top blood tubes collected; tourniquet removed, needle removed & intact, dressing applied; tubes labeled, given to officer Pride and placed in sealed container using chain of custody; pt tolerated well and continues to deny c/o or need to see ED provider; pt d/c in police custody

## 2019-01-21 ENCOUNTER — Inpatient Hospital Stay
Admission: RE | Admit: 2019-01-21 | Discharge: 2019-01-25 | DRG: 885 | Disposition: A | Discharge status: Home / Self Care | Payer: No Typology Code available for payment source | Source: Intra-hospital | Attending: Psychiatry | Admitting: Psychiatry

## 2019-01-21 ENCOUNTER — Encounter: Payer: Self-pay | Admitting: Emergency Medicine

## 2019-01-21 ENCOUNTER — Other Ambulatory Visit: Payer: Self-pay

## 2019-01-21 ENCOUNTER — Emergency Department
Admission: EM | Admit: 2019-01-21 | Discharge: 2019-01-21 | Disposition: A | Discharge status: Other Acute Inpatient Hospital | Attending: Emergency Medicine | Admitting: Emergency Medicine

## 2019-01-21 DIAGNOSIS — F12950 Cannabis use, unspecified with psychotic disorder with delusions: Secondary | ICD-10-CM | POA: Diagnosis not present

## 2019-01-21 DIAGNOSIS — F209 Schizophrenia, unspecified: Principal | ICD-10-CM | POA: Diagnosis present

## 2019-01-21 DIAGNOSIS — F22 Delusional disorders: Secondary | ICD-10-CM | POA: Insufficient documentation

## 2019-01-21 DIAGNOSIS — F19959 Other psychoactive substance use, unspecified with psychoactive substance-induced psychotic disorder, unspecified: Secondary | ICD-10-CM | POA: Insufficient documentation

## 2019-01-21 DIAGNOSIS — Z79899 Other long term (current) drug therapy: Secondary | ICD-10-CM | POA: Insufficient documentation

## 2019-01-21 DIAGNOSIS — F17200 Nicotine dependence, unspecified, uncomplicated: Secondary | ICD-10-CM | POA: Diagnosis present

## 2019-01-21 DIAGNOSIS — F29 Unspecified psychosis not due to a substance or known physiological condition: Secondary | ICD-10-CM | POA: Diagnosis present

## 2019-01-21 DIAGNOSIS — F1910 Other psychoactive substance abuse, uncomplicated: Secondary | ICD-10-CM | POA: Diagnosis present

## 2019-01-21 DIAGNOSIS — R45850 Homicidal ideations: Secondary | ICD-10-CM | POA: Diagnosis present

## 2019-01-21 DIAGNOSIS — Z20828 Contact with and (suspected) exposure to other viral communicable diseases: Secondary | ICD-10-CM | POA: Insufficient documentation

## 2019-01-21 LAB — SALICYLATE LEVEL: Salicylate Lvl: <7 mg/dL (ref 2.8–30.0)

## 2019-01-21 LAB — COMPREHENSIVE METABOLIC PANEL
ALT: 16 U/L (ref 0–44)
AST: 24 U/L (ref 15–41)
Albumin: 4.6 g/dL (ref 3.5–5.0)
Alkaline Phosphatase: 67 U/L (ref 38–126)
Anion gap: 13 (ref 5–15)
BUN: 12 mg/dL (ref 6–20)
CO2: 24 mmol/L (ref 22–32)
Calcium: 9.6 mg/dL (ref 8.9–10.3)
Chloride: 101 mmol/L (ref 98–111)
Creatinine, Ser: 1.36 mg/dL — ABNORMAL HIGH (ref 0.61–1.24)
GFR calc Af Amer: >60 mL/min (ref >60)
GFR calc non Af Amer: >60 mL/min (ref >60)
Glucose, Bld: 104 mg/dL — ABNORMAL HIGH (ref 70–99)
Potassium: 3.9 mmol/L (ref 3.5–5.1)
Sodium: 138 mmol/L (ref 135–145)
Total Bilirubin: 0.9 mg/dL (ref 0.3–1.2)
Total Protein: 7.1 g/dL (ref 6.5–8.1)

## 2019-01-21 LAB — CBC
HCT: 46.8 % (ref 39.0–52.0)
Hemoglobin: 16.5 g/dL (ref 13.0–17.0)
MCH: 29.6 pg (ref 26.0–34.0)
MCHC: 35.3 g/dL (ref 30.0–36.0)
MCV: 84 fL (ref 80.0–100.0)
Platelets: 223 10*3/uL (ref 150–400)
RBC: 5.57 MIL/uL (ref 4.22–5.81)
RDW: 11.9 % (ref 11.5–15.5)
WBC: 5.2 10*3/uL (ref 4.0–10.5)
nRBC: 0 % (ref 0.0–0.2)

## 2019-01-21 LAB — URINE DRUG SCREEN, QUALITATIVE (ARMC ONLY)
Amphetamines, Ur Screen: NOT DETECTED
Barbiturates, Ur Screen: NOT DETECTED
Benzodiazepine, Ur Scrn: NOT DETECTED
Cannabinoid 50 Ng, Ur ~~LOC~~: POSITIVE — AB
Cocaine Metabolite,Ur ~~LOC~~: POSITIVE — AB
MDMA (Ecstasy)Ur Screen: NOT DETECTED
Methadone Scn, Ur: NOT DETECTED
Opiate, Ur Screen: NOT DETECTED
Phencyclidine (PCP) Ur S: NOT DETECTED
Tricyclic, Ur Screen: NOT DETECTED

## 2019-01-21 LAB — ACETAMINOPHEN LEVEL: Acetaminophen (Tylenol), Serum: <10 ug/mL — ABNORMAL LOW (ref 10–30)

## 2019-01-21 LAB — ETHANOL: Alcohol, Ethyl (B): 13 mg/dL — ABNORMAL HIGH (ref <10)

## 2019-01-21 LAB — SARS CORONAVIRUS 2 BY RT PCR (HOSPITAL ORDER, PERFORMED IN ~~LOC~~ HOSPITAL LAB): SARS Coronavirus 2: NEGATIVE

## 2019-01-21 MED ORDER — ALUM & MAG HYDROXIDE-SIMETH 200-200-20 MG/5ML PO SUSP: 30.0000 mL | ORAL | Status: DC | PRN

## 2019-01-21 MED ORDER — RISPERIDONE 1 MG PO TABS
1.0000 mg | ORAL_TABLET | Freq: Two times a day (BID) | ORAL | Status: DC | PRN
  Administered 2019-01-22: 1 mg via ORAL
  Filled 2019-01-21: qty 1

## 2019-01-21 MED ORDER — RISPERIDONE 1 MG PO TABS: 1.0000 mg | ORAL_TABLET | Freq: Two times a day (BID) | ORAL | Status: DC | PRN

## 2019-01-21 MED ORDER — MAGNESIUM HYDROXIDE 400 MG/5ML PO SUSP: 30.0000 mL | Freq: Every day | ORAL | Status: DC | PRN

## 2019-01-21 MED ORDER — LORAZEPAM 2 MG PO TABS: 2.0000 mg | ORAL_TABLET | Freq: Four times a day (QID) | ORAL | Status: DC | PRN

## 2019-01-21 MED ORDER — ACETAMINOPHEN 325 MG PO TABS: 650.0000 mg | ORAL_TABLET | Freq: Four times a day (QID) | ORAL | Status: DC | PRN
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
## 2019-01-21 NOTE — ED Notes (Signed)
Hourly rounding reveals patient in hall bed asleep, eyes closed with equal unlabored RR. No complaints, stable, in no acute distress. Will continue to monitor

## 2019-01-21 NOTE — ED Notes (Signed)
Report to receiving nurse at [facility], will monitor patient until nurses in lower lever behavior  Are  ready for patient.

## 2019-01-21 NOTE — BH Assessment (Signed)
Patient is to be admitted to [facility] by Dr. Claris Gower.  Attending Physician will be Dr. Weber Cooks.   Patient has been assigned to room 314, by [facility] Charge Nurse Demetria.   Intake Paper Work has been signed and placed on patient chart.   ER staff is aware of the admission:  Glenda, ER Secretary    Dr. Jari Pigg, ER MD   Janett Billow, Patient's Nurse   Elberta Fortis, Patient Access.

## 2019-01-21 NOTE — Consult Note (Signed)
[facility] Face-to-Face Psychiatry Consult   Reason for Consult:  Psychiatric evaluation Referring Physician:  Dr. Manson Passey Patient Identification: Bradley Casey MRN:  [id number] Principal Diagnosis: Cannabis-induced psychotic disorder with delusions [facility]) Diagnosis:  Principal Problem:   Cannabis-induced psychotic disorder with delusions (HCC)        Despite spending the night in the ED, patient awoke this morning and remains psychotic and delusional.  Unsafe to go home at this time.  Substance-induced psychosis remains on the differential, however is less likely at this time.  Patient will benefit from inpatient hospitalization for safety stabilization and medication management.  Patient in agreement.  Corroborated the below information is accurate.   Original note as follows" Total Time spent with patient: 45 minutes  Subjective:   Bradley Casey is a 28 y.o. male patient admitted with delusional thoughts of people putting voodoo on him.  "Im here because people put voodoo on me"  HPI:  Bradley Casey, 28 y.o., male patient seen face to face  by this provider, Dr. Manson Passey; and chart reviewed on 01/21/19.  On evaluation Irvan Tiedt reports that he was with a friend of his and then another friend walked in and started talking about planets.  He states this is when the conversation "got weird."  Patient began rambling about how they knew his thoughts and needed 5 people souls and he was number 3. Patient admitted to smoking marijaunna and using cocain on Friday. He says that thi drug use has nothing to do with what he is experiencing currently.  He is currently very paranoid and anxious.  He becomes upset when it is explained to him that there is nothing we can do about the people that he believes put voodoo on him.  "So your just going to release me back to the streets with those people out there, when I can here for help?  Don't say anything when I hurt those people because man o man they are  really messing with me."  Pt denies prior mental health diagnosis, and psychiatric hospitalizations.  He denies VH, but says he sometimes hear humming from the TV when the TV is off.  He is here because he called the crisis hotline and they recommended he came in for a psych evaluation.  Pt stated that a girl he was with also suggested that he come to the hospital.    Patient is currently living with his cousin and is unemployed. Pt did not provide any collateral.  During evaluation Symir Mah is alert/oriented x 4; anxious, and paranoid.  Pt's conversation is delusional in content and loose in associations.  Pt's affect is full and labile. He does not appear to be responding to internal/external stimuli. He is thought to be having delusional thoughts.  Patient denies suicidal/self-harm, he endorses homicidal ideation towards the people who are "messing with his head".  Writer believes patient is having drug induced  psychosis, and paranoia.         Past Psychiatric History:Unknown  Risk to Self:  No  Risk to Others:   Prior Inpatient Therapy:   Prior Outpatient Therapy:    Past Medical History: History reviewed. No pertinent past medical history. History reviewed. No pertinent surgical history. Family History: No family history on file. Family Psychiatric  History: unknown Social History:  Social History   Substance and Sexual Activity  Alcohol Use Yes     Social History   Substance and Sexual Activity  Drug Use Yes  . Types: Marijuana, Cocaine  Social History   Socioeconomic History  . Marital status: Single    Spouse name: Not on file  . Number of children: Not on file  . Years of education: Not on file  . Highest education level: Not on file  Occupational History  . Not on file  Social Needs  . Financial resource strain: Not on file  . Food insecurity    Worry: Not on file    Inability: Not on file  . Transportation needs    Medical: Not on file     Non-medical: Not on file  Tobacco Use  . Smoking status: Current Some Day Smoker  . Smokeless tobacco: Never Used  Substance and Sexual Activity  . Alcohol use: Yes  . Drug use: Yes    Types: Marijuana, Cocaine  . Sexual activity: Not on file  Lifestyle  . Physical activity    Days per week: Not on file    Minutes per session: Not on file  . Stress: Not on file  Relationships  . Social Musician on phone: Not on file    Gets together: Not on file    Attends religious service: Not on file    Active member of club or organization: Not on file    Attends meetings of clubs or organizations: Not on file    Relationship status: Not on file  Other Topics Concern  . Not on file  Social History Narrative  . Not on file   Additional Social History:    Allergies:  No Known Allergies  Labs:  Results for orders placed or performed during the hospital encounter of 01/21/19 (from the past 48 hour(s))  Comprehensive metabolic panel     Status: Abnormal   Collection Time: 01/21/19  2:52 AM  Result Value Ref Range   Sodium 138 135 - 145 mmol/L   Potassium 3.9 3.5 - 5.1 mmol/L   Chloride 101 98 - 111 mmol/L   CO2 24 22 - 32 mmol/L   Glucose, Bld 104 (H) 70 - 99 mg/dL   BUN 12 6 - 20 mg/dL   Creatinine, Ser 9.14 (H) 0.61 - 1.24 mg/dL   Calcium 9.6 8.9 - 78.2 mg/dL   Total Protein 7.1 6.5 - 8.1 g/dL   Albumin 4.6 3.5 - 5.0 g/dL   AST 24 15 - 41 U/L   ALT 16 0 - 44 U/L   Alkaline Phosphatase 67 38 - 126 U/L   Total Bilirubin 0.9 0.3 - 1.2 mg/dL   GFR calc non Af Amer >60 >60 mL/min   GFR calc Af Amer >60 >60 mL/min   Anion gap 13 5 - 15    Comment: Performed at [facility], [address], [place], [place] [address]  Ethanol     Status: Abnormal   Collection Time: 01/21/19  2:52 AM  Result Value Ref Range   Alcohol, Ethyl (B) 13 (H) <10 mg/dL    Comment: (NOTE) Lowest detectable limit for serum alcohol is 10 mg/dL. For medical purposes only. Performed at  [facility], [address] [place], [place] [address]   Salicylate level     Status: None   Collection Time: 01/21/19  2:52 AM  Result Value Ref Range   Salicylate Lvl <7.0 2.8 - 30.0 mg/dL    Comment: Performed at [facility], [address], [place], [place] [address]  Acetaminophen level     Status: Abnormal   Collection Time: 01/21/19  2:52 AM  Result Value Ref Range   Acetaminophen (Tylenol), Serum <10 (L) 10 - 30 ug/mL    Comment: (NOTE) Therapeutic concentrations vary significantly. A range of 10-30 ug/mL  may be an effective concentration for many patients. However, some  are best treated at concentrations outside of this range. Acetaminophen concentrations >150 ug/mL at 4 hours after ingestion  and >50 ug/mL at 12 hours after ingestion are often associated with  toxic reactions. Performed at [facility], [address] [place], [place] [address]   cbc     Status: None   Collection Time: 01/21/19  2:52 AM  Result Value Ref Range   WBC 5.2 4.0 - 10.5 K/uL   RBC 5.57 4.22 - 5.81 MIL/uL   Hemoglobin 16.5 13.0 - 17.0 g/dL   HCT 95.246.8 84.139.0 - 32.452.0 %   MCV 84.0 80.0 - 100.0 fL   MCH 29.6 26.0 - 34.0 pg   MCHC 35.3 30.0 - 36.0 g/dL   RDW 40.111.9 02.711.5 - 25.315.5 %   Platelets 223 150 - 400 K/uL   nRBC 0.0 0.0 - 0.2 %    Comment: Performed at [facility], [address] [place], [place] [address]  Urine Drug Screen, Qualitative     Status: Abnormal   Collection Time: 01/21/19  2:52 AM  Result Value Ref Range   Tricyclic, Ur Screen NONE DETECTED NONE DETECTED   Amphetamines, Ur Screen NONE DETECTED NONE DETECTED   MDMA (Ecstasy)Ur Screen NONE DETECTED NONE DETECTED   Cocaine Metabolite,Ur [place] POSITIVE (A) NONE DETECTED   Opiate, Ur Screen NONE DETECTED NONE DETECTED   Phencyclidine (PCP) Ur S NONE DETECTED NONE DETECTED   Cannabinoid 50 Ng, Ur [place] POSITIVE (A) NONE DETECTED   Barbiturates, Ur Screen NONE DETECTED NONE DETECTED    Benzodiazepine, Ur Scrn NONE DETECTED NONE DETECTED   Methadone Scn, Ur NONE DETECTED NONE DETECTED    Comment: (NOTE) Tricyclics + metabolites, urine    Cutoff 1000 ng/mL Amphetamines + metabolites, urine  Cutoff 1000 ng/mL MDMA (Ecstasy), urine              Cutoff 500 ng/mL Cocaine Metabolite, urine          Cutoff 300 ng/mL Opiate + metabolites, urine        Cutoff 300 ng/mL Phencyclidine (PCP), urine         Cutoff 25 ng/mL Cannabinoid, urine                 Cutoff 50 ng/mL Barbiturates + metabolites, urine  Cutoff 200 ng/mL Benzodiazepine, urine              Cutoff 200 ng/mL Methadone, urine                   Cutoff 300 ng/mL The urine drug screen provides only a preliminary, unconfirmed analytical test result and should not be used for non-medical purposes. Clinical consideration and professional judgment should be applied to any positive drug screen result due to possible interfering substances. A more specific alternate chemical method must be used in order to obtain a confirmed analytical result. Gas chromatography / mass spectrometry (GC/MS) is the preferred confirmat ory method. Performed at [facility], [address] [place], [place] [address]     Current Facility-Administered Medications  Medication Dose Route Frequency Provider Last Rate Last Dose  . LORazepam (ATIVAN) tablet 2 mg  2 mg Oral Q6H PRN Guilford Shannahan, Worthy RancherPaul A, MD      . risperiDONE (RISPERDAL)  tablet 1 mg  1 mg Oral BID PRN Sonda Coppens, Dorene Ar, MD       Current Outpatient Medications  Medication Sig Dispense Refill  . cefUROXime (CEFTIN) 500 MG tablet Take 1 tablet (500 mg total) by mouth 2 (two) times daily. 20 tablet 0  . mupirocin ointment (BACTROBAN) 2 % Apply 1 application topically 3 (three) times daily. 22 g 0  . ondansetron (ZOFRAN) 4 MG tablet Take 1 tablet (4 mg total) by mouth daily as needed for nausea or vomiting. 20 tablet 1    Musculoskeletal: Strength & Muscle Tone: within  normal limits Gait & Station: normal Patient leans: N/A  Psychiatric Specialty Exam: Physical Exam  Nursing note and vitals reviewed. Constitutional: He is oriented to person, place, and time.  HENT:  Head: Normocephalic.  Eyes: Pupils are equal, round, and reactive to light.  Neck: Normal range of motion.  Cardiovascular: Normal rate.  Respiratory: Effort normal and breath sounds normal.  Musculoskeletal: Normal range of motion.  Neurological: He is alert and oriented to person, place, and time.  Skin: Skin is warm and dry.  Psychiatric: His speech is normal. His mood appears anxious. His affect is labile. He is agitated and actively hallucinating. Thought content is paranoid. Cognition and memory are normal. He expresses impulsivity and inappropriate judgment. He expresses homicidal ideation.    Review of Systems  Psychiatric/Behavioral: Positive for substance abuse. The patient is nervous/anxious.   All other systems reviewed and are negative.   Blood pressure 121/65, pulse 68, temperature 97.6 F (36.4 C), temperature source Oral, resp. rate 18, height 5\' 11"  (1.803 m), weight 79.4 kg, SpO2 100 %.Body mass index is 24.41 kg/m.  General Appearance: Casual  Eye Contact:  Good  Speech:  Clear and Coherent  Volume:  Normal  Mood:  Anxious and Paranoid  Affect:  Labile and Full Range  Thought Process:  Disorganized and Descriptions of Associations: Circumstantial  Orientation:  Full (Time, Place, and Person)  Thought Content:  Illogical, Delusions, Paranoid Ideation and Rumination  Suicidal Thoughts:  No  Homicidal Thoughts:  Yes.  without intent/plan  Memory:  Immediate;   Fair  Judgement:  Impaired  Insight:  Lacking  Psychomotor Activity:  Normal  Concentration:  Concentration: Fair  Recall:  Good  Fund of Knowledge:  Good  Language:  Good  Akathisia:  NA  Handed:  Right  AIMS (if indicated):     Assets:  Transportation  ADL's:  Intact  Cognition:  WNL  Sleep:         Treatment Plan Summary: 28 year old male with no past psychiatric history presenting in a psychotic state.  Patient will benefit from inpatient hospitalization for safety, stabilization, medication management.  Diagnosis: Psychosis unspecified  Disposition: Admit to behavioral health unit under voluntary admission.   Medications: Ativan 2 mg p.o. as needed every 6 hours anxiety, Risperidone 1 mg p.o. as needed every 12 hours psychosis    Dixie Dials, MD 01/21/2019 10:03 AM

## 2019-01-21 NOTE — ED Notes (Signed)
VOL, pt to be admitted to [facility] BMU bed 314

## 2019-01-21 NOTE — ED Notes (Signed)
Hourly rounding reveals patient in day room. No complaints, stable, in no acute distress. Q15 minute rounds and monitoring via Security Cameras to continue. 

## 2019-01-21 NOTE — ED Notes (Signed)
Report to include Situation, Background, Assessment, and Recommendations received from [facility]. Patient alert and oriented, warm and dry, in no acute distress. Patient denies HI, AVH and pain. Patient states he is still thinking of hurting himself without plan. Patient made aware of Q15 minute rounds and security cameras for their safety. Patient instructed to come to me with needs or concerns.

## 2019-01-21 NOTE — ED Notes (Signed)
Pt ambulatory to restroom with steady gait. Security with pt for safety. Pt appears calm and cooperative at this time.

## 2019-01-21 NOTE — ED Notes (Signed)
Pt sitting in bed awake & in NAD with no concerns or requests for staff at this time. Security present at bedside. Will continue to monitor

## 2019-01-21 NOTE — ED Notes (Addendum)
Sleeping with head under covers. Ate 100% of his lunch

## 2019-01-21 NOTE — ED Notes (Signed)
Patient is appropriate and cooperative, explained to patient that the patient in room 2 does not mean any harm but we will keep her away from his door.

## 2019-01-21 NOTE — ED Notes (Signed)
Psych Nurse Practitioner at the bedside for pt evaluation.

## 2019-01-21 NOTE — ED Notes (Signed)
Pt dressed out. Belongings: sneakers, socks, belt, jeans, tshirt, watch, phone, $1.

## 2019-01-21 NOTE — ED Notes (Signed)
Meal tray given 

## 2019-01-21 NOTE — ED Triage Notes (Signed)
Patient states that he thinks someone has done some voodoo on him. Patient states that he has had similar experiences in the past but did not see anyone for it. Patient with flight of ideas.

## 2019-01-21 NOTE — Consult Note (Signed)
[facility] Face-to-Face Psychiatry Consult   Reason for Consult:  Psychiatric evaluation Referring Physician:  Dr. Manson Passey Patient Identification: Bradley Casey MRN:  [id number] Principal Diagnosis: Cannabis-induced psychotic disorder with delusions (HCC) Diagnosis:  Principal Problem:   Cannabis-induced psychotic disorder with delusions (HCC)   Total Time spent with patient: 45 minutes  Subjective:   Bradley Casey is a 28 y.o. male patient admitted with delusional thoughts of people putting voodoo on him.  "Im here because people put voodoo on me"  HPI:  Bradley Casey, 28 y.o., male patient seen face to face  by this provider, Dr. Manson Passey; and chart reviewed on 01/21/19.  On evaluation [name] reports that he was with a friend of his and then another friend walked in and started talking about planets.  He states this is when the conversation "got weird."  Patient began rambling about how they knew his thoughts and needed 5 people souls and he was number 3. Patient admitted to smoking marijaunna and using cocain on Friday. He says that thi drug use has nothing to do with what he is experiencing currently.  He is currently very paranoid and anxious.  He becomes upset when it is explained to him that there is nothing we can do about the people that he believes put voodoo on him.  "So your just going to release me back to the streets with those people out there, when I can here for help?  Don't say anything when I hurt those people because man o man they are really messing with me."  Pt denies prior mental health diagnosis, and psychiatric hospitalizations.  He denies VH, but says he sometimes hear humming from the TV when the TV is off.  He is here because he called the crisis hotline and they recommended he came in for a psych evaluation.  Pt stated that a girl he was with also suggested that he come to the hospital.    Patient is currently living with his cousin and is unemployed. Pt did not provide  any collateral.  During evaluation [name] is alert/oriented x 4; anxious, and paranoid.  Pt's conversation is delusional in content and loose in associations.  Pt's affect is full and labile. He does not appear to be responding to internal/external stimuli. He is thought to be having delusional thoughts.  Patient denies suicidal/self-harm, he endorses homicidal ideation towards the people who are "messing with his head".  Writer believes patient is having drug induced  psychosis, and paranoia.      Recommendation: Reassessment in the am.   Past Psychiatric History:Unknown  Risk to Self:  No  Risk to Others:   Prior Inpatient Therapy:   Prior Outpatient Therapy:    Past Medical History: History reviewed. No pertinent past medical history. History reviewed. No pertinent surgical history. Family History: No family history on file. Family Psychiatric  History: unknown Social History:  Social History   Substance and Sexual Activity  Alcohol Use Yes     Social History   Substance and Sexual Activity  Drug Use Yes  . Types: Marijuana, Cocaine    Social History   Socioeconomic History  . Marital status: Single    Spouse name: Not on file  . Number of children: Not on file  . Years of education: Not on file  . Highest education level: Not on file  Occupational History  . Not on file  Social Needs  . Financial resource strain: Not on file  . Food insecurity  Worry: Not on file    Inability: Not on file  . Transportation needs    Medical: Not on file    Non-medical: Not on file  Tobacco Use  . Smoking status: Current Some Day Smoker  . Smokeless tobacco: Never Used  Substance and Sexual Activity  . Alcohol use: Yes  . Drug use: Yes    Types: Marijuana, Cocaine  . Sexual activity: Not on file  Lifestyle  . Physical activity    Days per week: Not on file    Minutes per session: Not on file  . Stress: Not on file  Relationships  . Social Musician  on phone: Not on file    Gets together: Not on file    Attends religious service: Not on file    Active member of club or organization: Not on file    Attends meetings of clubs or organizations: Not on file    Relationship status: Not on file  Other Topics Concern  . Not on file  Social History Narrative  . Not on file   Additional Social History:    Allergies:  No Known Allergies  Labs:  Results for orders placed or performed during the hospital encounter of 01/21/19 (from the past 48 hour(s))  Comprehensive metabolic panel     Status: Abnormal   Collection Time: 01/21/19  2:52 AM  Result Value Ref Range   Sodium 138 135 - 145 mmol/L   Potassium 3.9 3.5 - 5.1 mmol/L   Chloride 101 98 - 111 mmol/L   CO2 24 22 - 32 mmol/L   Glucose, Bld 104 (H) 70 - 99 mg/dL   BUN 12 6 - 20 mg/dL   Creatinine, Ser 9.47 (H) 0.61 - 1.24 mg/dL   Calcium 9.6 8.9 - 65.4 mg/dL   Total Protein 7.1 6.5 - 8.1 g/dL   Albumin 4.6 3.5 - 5.0 g/dL   AST 24 15 - 41 U/L   ALT 16 0 - 44 U/L   Alkaline Phosphatase 67 38 - 126 U/L   Total Bilirubin 0.9 0.3 - 1.2 mg/dL   GFR calc non Af Amer >60 >60 mL/min   GFR calc Af Amer >60 >60 mL/min   Anion gap 13 5 - 15    Comment: Performed at [facility], [address] [place], [place] [address]  Ethanol     Status: Abnormal   Collection Time: 01/21/19  2:52 AM  Result Value Ref Range   Alcohol, Ethyl (B) 13 (H) <10 mg/dL    Comment: (NOTE) Lowest detectable limit for serum alcohol is 10 mg/dL. For medical purposes only. Performed at [facility], [address] [place], [place] [address]   Salicylate level     Status: None   Collection Time: 01/21/19  2:52 AM  Result Value Ref Range   Salicylate Lvl <7.0 2.8 - 30.0 mg/dL    Comment: Performed at [facility], [address] [place], [place] [address]  Acetaminophen level     Status: Abnormal   Collection Time: 01/21/19  2:52 AM  Result Value Ref Range   Acetaminophen  (Tylenol), Serum <10 (L) 10 - 30 ug/mL    Comment: (NOTE) Therapeutic concentrations vary significantly. A range of 10-30 ug/mL  may be an effective concentration for many patients. However, some  are best treated at concentrations outside of this range. Acetaminophen concentrations >150 ug/mL at 4 hours after ingestion  and >50 ug/mL at 12 hours after ingestion  are often associated with  toxic reactions. Performed at [facility], [address] [place], [place] [address]   cbc     Status: None   Collection Time: 01/21/19  2:52 AM  Result Value Ref Range   WBC 5.2 4.0 - 10.5 K/uL   RBC 5.57 4.22 - 5.81 MIL/uL   Hemoglobin 16.5 13.0 - 17.0 g/dL   HCT 46.8 39.0 - 52.0 %   MCV 84.0 80.0 - 100.0 fL   MCH 29.6 26.0 - 34.0 pg   MCHC 35.3 30.0 - 36.0 g/dL   RDW 11.9 11.5 - 15.5 %   Platelets 223 150 - 400 K/uL   nRBC 0.0 0.0 - 0.2 %    Comment: Performed at [facility], [address], [place], [place] [address]  Urine Drug Screen, Qualitative     Status: Abnormal   Collection Time: 01/21/19  2:52 AM  Result Value Ref Range   Tricyclic, Ur Screen NONE DETECTED NONE DETECTED   Amphetamines, Ur Screen NONE DETECTED NONE DETECTED   MDMA (Ecstasy)Ur Screen NONE DETECTED NONE DETECTED   Cocaine Metabolite,Ur [place] POSITIVE (A) NONE DETECTED   Opiate, Ur Screen NONE DETECTED NONE DETECTED   Phencyclidine (PCP) Ur S NONE DETECTED NONE DETECTED   Cannabinoid 50 Ng, Ur [place] POSITIVE (A) NONE DETECTED   Barbiturates, Ur Screen NONE DETECTED NONE DETECTED   Benzodiazepine, Ur Scrn NONE DETECTED NONE DETECTED   Methadone Scn, Ur NONE DETECTED NONE DETECTED    Comment: (NOTE) Tricyclics + metabolites, urine    Cutoff 1000 ng/mL Amphetamines + metabolites, urine  Cutoff 1000 ng/mL MDMA (Ecstasy), urine              Cutoff 500 ng/mL Cocaine Metabolite, urine          Cutoff 300 ng/mL Opiate + metabolites, urine        Cutoff 300 ng/mL Phencyclidine (PCP), urine         Cutoff  25 ng/mL Cannabinoid, urine                 Cutoff 50 ng/mL Barbiturates + metabolites, urine  Cutoff 200 ng/mL Benzodiazepine, urine              Cutoff 200 ng/mL Methadone, urine                   Cutoff 300 ng/mL The urine drug screen provides only a preliminary, unconfirmed analytical test result and should not be used for non-medical purposes. Clinical consideration and professional judgment should be applied to any positive drug screen result due to possible interfering substances. A more specific alternate chemical method must be used in order to obtain a confirmed analytical result. Gas chromatography / mass spectrometry (GC/MS) is the preferred confirmat ory method. Performed at [facility], [address] [place], [place] [address]     No current facility-administered medications for this encounter.    Current Outpatient Medications  Medication Sig Dispense Refill  . cefUROXime (CEFTIN) 500 MG tablet Take 1 tablet (500 mg total) by mouth 2 (two) times daily. 20 tablet 0  . mupirocin ointment (BACTROBAN) 2 % Apply 1 application topically 3 (three) times daily. 22 g 0  . ondansetron (ZOFRAN) 4 MG tablet Take 1 tablet (4 mg total) by mouth daily as needed for nausea or vomiting. 20 tablet 1    Musculoskeletal: Strength & Muscle Tone: within normal limits Gait & Station: normal Patient leans: N/A  Psychiatric Specialty Exam: Physical  Exam  Nursing note and vitals reviewed. Constitutional: He is oriented to person, place, and time.  HENT:  Head: Normocephalic.  Eyes: Pupils are equal, round, and reactive to light.  Neck: Normal range of motion.  Cardiovascular: Normal rate.  Respiratory: Effort normal and breath sounds normal.  Musculoskeletal: Normal range of motion.  Neurological: He is alert and oriented to person, place, and time.  Skin: Skin is warm and dry.  Psychiatric: His speech is normal. His mood appears anxious. His affect is labile. He is  agitated and actively hallucinating. Thought content is paranoid. Cognition and memory are normal. He expresses impulsivity and inappropriate judgment. He expresses homicidal ideation.    Review of Systems  Psychiatric/Behavioral: Positive for substance abuse. The patient is nervous/anxious.   All other systems reviewed and are negative.   Blood pressure (!) 154/120, pulse 75, temperature 98.8 F (37.1 C), temperature source Oral, resp. rate 18, height 5\' 11"  (1.803 m), weight 79.4 kg, SpO2 98 %.Body mass index is 24.41 kg/m.  General Appearance: Casual  Eye Contact:  Good  Speech:  Clear and Coherent  Volume:  Normal  Mood:  Anxious and Paranoid  Affect:  Labile and Full Range  Thought Process:  Disorganized and Descriptions of Associations: Circumstantial  Orientation:  Full (Time, Place, and Person)  Thought Content:  Illogical, Delusions, Paranoid Ideation and Rumination  Suicidal Thoughts:  No  Homicidal Thoughts:  Yes.  without intent/plan  Memory:  Immediate;   Fair  Judgement:  Impaired  Insight:  Lacking  Psychomotor Activity:  Normal  Concentration:  Concentration: Fair  Recall:  Good  Fund of Knowledge:  Good  Language:  Good  Akathisia:  NA  Handed:  Right  AIMS (if indicated):     Assets:  Transportation  ADL's:  Intact  Cognition:  WNL  Sleep:        Treatment Plan Summary: Plan reassess pt in the am. If patients presentation is the same then inpatient hospitalization is recommended  Disposition: Supportive therapy provided about ongoing stressors. reassess in the am.  Inpatient hospitalization if presentation has not improved, and or psychosis has not sudsided.   Discussed disposition with Dr. Manson PasseyBrown.  Jearld Leschashaun M Britiny Defrain, NP 01/21/2019 4:31 AM

## 2019-01-21 NOTE — ED Provider Notes (Signed)
[facility] Emergency Department Provider Note ____________   First MD Initiated Contact with Patient 01/21/19 0327     (approximate)  I have reviewed the triage vital signs and the nursing notes.  Level 5 caveat: History review of system limited secondary to paranoid delusions HISTORY  Chief Complaint Psychiatric Evaluation    HPI Bradley Casey is a 28 y.o. male presents to the emergency department with paranoid delusions.  Patient states that he believes someone may have done "voodoo on him".  Patient also states that he was told by someone that "I got superpowers".      History reviewed. No pertinent past medical history.  There are no active problems to display for this patient.   History reviewed. No pertinent surgical history.  Prior to Admission medications   Medication Sig Start Date End Date Taking? Authorizing Provider  cefUROXime (CEFTIN) 500 MG tablet Take 1 tablet (500 mg total) by mouth 2 (two) times daily. 01/04/15   Frederich Cha, MD  mupirocin ointment (BACTROBAN) 2 % Apply 1 application topically 3 (three) times daily. 01/04/15   Frederich Cha, MD  ondansetron (ZOFRAN) 4 MG tablet Take 1 tablet (4 mg total) by mouth daily as needed for nausea or vomiting. 07/14/15   Lavonia Drafts, MD    Allergies Patient has no known allergies.  No family history on file.  Social History Social History   Tobacco Use  . Smoking status: Current Some Day Smoker  . Smokeless tobacco: Never Used  Substance Use Topics  . Alcohol use: Yes  . Drug use: Yes    Types: Marijuana, Cocaine    Review of Systems Constitutional: No fever/chills Eyes: No visual changes. ENT: No sore throat. Cardiovascular: Denies chest pain. Respiratory: Denies shortness of breath. Gastrointestinal: No abdominal pain.  No nausea, no vomiting.  No diarrhea.  No constipation. Genitourinary: Negative for dysuria. Musculoskeletal: Negative for neck pain.  Negative for  back pain. Integumentary: Negative for rash. Neurological: Negative for headaches, focal weakness or numbness. Psychiatric: Positive for paranoid delusions. ____________________________________________   PHYSICAL EXAM:  VITAL SIGNS: ED Triage Vitals [01/21/19 0237]  Enc Vitals Group     BP (!) 154/120     Pulse Rate 75     Resp 18     Temp 98.8 F (37.1 C)     Temp Source Oral     SpO2 98 %     Weight 79.4 kg (175 lb)     Height 1.803 m (5\' 11" )     Head Circumference      Peak Flow      Pain Score 0     Pain Loc      Pain Edu?    Constitutional: Alert and oriented.  Eyes: Conjunctivae are normal. . Mouth/Throat: Patient is wearing a mask. Neck: No stridor.  No meningeal signs.   Cardiovascular: Normal rate, regular rhythm. Good peripheral circulation. Grossly normal heart sounds. Respiratory: Normal respiratory effort.  No retractions. Gastrointestinal: Soft and nontender. No distention.  Musculoskeletal: No lower extremity tenderness nor edema. No gross deformities of extremities. Neurologic:  Normal speech and language. No gross focal neurologic deficits are appreciated.  Skin:  Skin is warm, dry and intact. Psychiatric: Paranoid delusions, flight of ideas, bizarre affect  ____________________________________________   LABS (all labs ordered are listed, but only abnormal results are displayed)  Labs Reviewed  COMPREHENSIVE METABOLIC PANEL - Abnormal; Notable for the following components:      Result Value   Glucose, Bld  104 (*)    Creatinine, Ser 1.36 (*)    All other components within normal limits  ETHANOL - Abnormal; Notable for the following components:   Alcohol, Ethyl (B) 13 (*)    All other components within normal limits  ACETAMINOPHEN LEVEL - Abnormal; Notable for the following components:   Acetaminophen (Tylenol), Serum <10 (*)    All other components within normal limits  URINE DRUG SCREEN, QUALITATIVE ([facility] ONLY) - Abnormal; Notable for the  following components:   Cocaine Metabolite,Ur [place] POSITIVE (*)    Cannabinoid 50 Ng, Ur [place] POSITIVE (*)    All other components within normal limits  SALICYLATE LEVEL  CBC   ____________________________________________ :  Procedures   ____________________________________________   INITIAL IMPRESSION / MDM / ASSESSMENT AND PLAN / ED COURSE  As part of my medical decision making, I reviewed the following data within the electronic MEDICAL RECORD NUMBER   28 year old male presenting with above-stated history and physical exam due to paranoid delusions.  Awaiting psychiatry consultation and disposition.  ____________________________________________  FINAL CLINICAL IMPRESSION(S) / ED DIAGNOSES  Final diagnoses:  Polysubstance abuse (HCC)  Paranoid delusion (HCC)     MEDICATIONS GIVEN DURING THIS VISIT:  Medications - No data to display   ED Discharge Orders    None      *Please note:  Bradley Casey was evaluated in Emergency Department on 01/21/2019 for the symptoms described in the history of present illness. He was evaluated in the context of the global COVID-19 pandemic, which necessitated consideration that the patient might be at risk for infection with the SARS-CoV-2 virus that causes COVID-19. Institutional protocols and algorithms that pertain to the evaluation of patients at risk for COVID-19 are in a state of rapid change based on information released by regulatory bodies including the CDC and federal and state organizations. These policies and algorithms were followed during the patient's care in the ED.  Some ED evaluations and interventions may be delayed as a result of limited staffing during the pandemic.*  Note:  This document was prepared using Dragon voice recognition software and may include unintentional dictation errors.   Darci Current, MD 01/21/19 [phone]

## 2019-01-21 NOTE — BH Assessment (Signed)
Assessment Note  Bradley Casey is an 28 y.o. male presents to the emergency department with paranoid delusions. Patient states that he believes someone may have done "voodoo on him". Patient also states that he was told by someone that "I got superpowers". Patient unable to recall the last day he used cocaine and/or marijuana. His UDS results were positive for cocaine and THC. Pt presented with a confused and blunted affect.  Diagnosis: Substance-Induced Psychosis  Past Medical History: History reviewed. No pertinent past medical history.  History reviewed. No pertinent surgical history.  Family History: No family history on file.  Social History:  reports that he has been smoking. He has never used smokeless tobacco. He reports current alcohol use. He reports current drug use. Drugs: Marijuana and Cocaine.  Additional Social History:  Alcohol / Drug Use Pain Medications: See MAR Prescriptions: See MAR Over the Counter: See MAR History of alcohol / drug use?: Yes Longest period of sobriety (when/how long): Unable to Quantify Negative Consequences of Use: Personal relationships, Work / School Withdrawal Symptoms: (UTA) Substance #1 Name of Substance 1: Cocaine 1 - Age of First Use: UTA - Unable to Assess 1 - Amount (size/oz): UTA - Unable to Assess 1 - Frequency: UTA - Unable to Assess 1 - Duration: UTA - Unable to Assess 1 - Last Use / Amount: UTA - Unable to Assess Substance #2 Name of Substance 2: Cannabis 2 - Age of First Use: UTA - Unable to Assess 2 - Amount (size/oz): UTA - Unable to Assess 2 - Frequency: UTA - Unable to Assess 2 - Duration: UTA - Unable to Assess 2 - Last Use / Amount: UTA - Unable to Assess  CIWA: CIWA-Ar BP: 121/65 Pulse Rate: 68 COWS:    Allergies: No Known Allergies  Home Medications: (Not in a hospital admission)   OB/GYN Status:  No LMP for male patient.  General Assessment Data Location of Assessment: [facility] ED TTS Assessment: In  system Is this a Tele or Face-to-Face Assessment?: Face-to-Face Is this an Initial Assessment or a Re-assessment for this encounter?: Initial Assessment Patient Accompanied by:: N/A Language Other than English: No Living Arrangements: Other (Comment)(Private Residence) What gender do you identify as?: Male Marital status: Single Maiden name: N/A Living Arrangements: Alone Can pt return to current living arrangement?: Yes Admission Status: Voluntary Is patient capable of signing voluntary admission?: Yes Referral Source: Self/Family/Friend Insurance type: None  Medical Screening Exam [facility] Walk-in ONLY) Medical Exam completed: Yes  Crisis Care Plan Living Arrangements: Alone Legal Guardian: Other:(Self) Name of Psychiatrist: UTA - Unable to Assess Name of Therapist: UTA - Unable to Assess  Education Status Is patient currently in school?: No Is the patient employed, unemployed or receiving disability?: (UTA - Unable to Assess)  Risk to self with the past 6 months Suicidal Ideation: No Has patient been a risk to self within the past 6 months prior to admission? : No Suicidal Intent: No Has patient had any suicidal intent within the past 6 months prior to admission? : No Is patient at risk for suicide?: No Suicidal Plan?: No Has patient had any suicidal plan within the past 6 months prior to admission? : No Access to Means: No What has been your use of drugs/alcohol within the last 12 months?: Cocaine; THC Previous Attempts/Gestures: (UTA - Unable to Assess) How many times?: (UTA - Unable to Assess) Other Self Harm Risks: UTA - Unable to Assess Triggers for Past Attempts: None known Intentional Self Injurious Behavior: None Family  Suicide History: Unable to assess Recent stressful life event(s): (UTA - Unable to Assess) Persecutory voices/beliefs?: Yes Depression: No Depression Symptoms: (UTA - Unable to Assess) Substance abuse history and/or treatment for substance  abuse?: Yes Suicide prevention information given to non-admitted patients: Not applicable  Risk to Others within the past 6 months Homicidal Ideation: No Does patient have any lifetime risk of violence toward others beyond the six months prior to admission? : No Thoughts of Harm to Others: No Current Homicidal Intent: No Current Homicidal Plan: No Access to Homicidal Means: No Identified Victim: None History of harm to others?: No Assessment of Violence: None Noted Violent Behavior Description: UTA - Unable to Assess Does patient have access to weapons?: No Criminal Charges Pending?: No Does patient have a court date: No Is patient on probation?: No  Psychosis Hallucinations: Visual, Auditory Delusions: Persecutory  Mental Status Report Appearance/Hygiene: In scrubs, In hospital gown Eye Contact: Unable to Assess Motor Activity: Unable to assess Speech: Unable to assess Level of Consciousness: Unable to assess Mood: (UTA - Unable to Assess) Affect: Blunted Anxiety Level: Minimal Judgement: Impaired Orientation: Person, Place, Situation Obsessive Compulsive Thoughts/Behaviors: None  Cognitive Functioning Concentration: Normal Memory: Unable to Assess Is patient IDD: No Insight: Unable to Assess Impulse Control: Unable to Assess Appetite: (UTA - Unable to Assess) Have you had any weight changes? : (UTA - Unable to Assess) Sleep: Unable to Assess Total Hours of Sleep: (UTA - Unable to Assess) Vegetative Symptoms: Unable to Assess  ADLScreening [facility] Assessment Services) Patient's cognitive ability adequate to safely complete daily activities?: Yes Patient able to express need for assistance with ADLs?: Yes Independently performs ADLs?: Yes (appropriate for developmental age)  Prior Inpatient Therapy Prior Inpatient Therapy: No  Prior Outpatient Therapy Prior Outpatient Therapy: No Does patient have an ACCT team?: No Does patient have Intensive In-House Services?   : No Does patient have [facility] services? : No Does patient have P4CC services?: No  ADL Screening (condition at time of admission) Patient's cognitive ability adequate to safely complete daily activities?: Yes Patient able to express need for assistance with ADLs?: Yes Independently performs ADLs?: Yes (appropriate for developmental age)       Abuse/Neglect Assessment (Assessment to be complete while patient is alone) Abuse/Neglect Assessment Can Be Completed: Yes Physical Abuse: Denies Verbal Abuse: Denies Sexual Abuse: Denies Exploitation of patient/patient's resources: Denies Self-Neglect: Denies Values / Beliefs Cultural Requests During Hospitalization: None Spiritual Requests During Hospitalization: None Consults Spiritual Care Consult Needed: No Social Work Consult Needed: No Regulatory affairs officer (For Healthcare) Does Patient Have a Medical Advance Directive?: No       Child/Adolescent Assessment Running Away Risk: (Patient is an adult)  Disposition:  Disposition Initial Assessment Completed for this Encounter: Yes Disposition of Patient: Admit Type of inpatient treatment program: Adult Patient refused recommended treatment: No Mode of transportation if patient is discharged/movement?: N/A Patient referred to: Other (Comment)([facility])  On Site Evaluation by:   Reviewed with Physician:    Lonzo Cloud SPEASE 01/21/2019 1:55 PM

## 2019-01-21 NOTE — ED Notes (Signed)
Report called and given to receiving nurse in lower level BHU. Will call back after she has discharged a few patients of her unit to accept p[atient into the unit.

## 2019-01-21 NOTE — ED Notes (Signed)
Patient assigned to appropriate care area   Introduced self to pt  Patient oriented to unit/care area: Informed that, for their safety, care areas are designed for safety and visiting and phone hours explained to patient. Patient verbalizes understanding, and verbal contract for safety obtained  Environment secured  

## 2019-01-22 ENCOUNTER — Other Ambulatory Visit: Payer: Self-pay

## 2019-01-22 DIAGNOSIS — F29 Unspecified psychosis not due to a substance or known physiological condition: Secondary | ICD-10-CM

## 2019-01-22 LAB — HEPATIC FUNCTION PANEL
ALT: 15 U/L (ref 0–44)
AST: 23 U/L (ref 15–41)
Albumin: 4.6 g/dL (ref 3.5–5.0)
Alkaline Phosphatase: 66 U/L (ref 38–126)
Bilirubin, Direct: 0.2 mg/dL (ref 0.0–0.2)
Indirect Bilirubin: 0.9 mg/dL (ref 0.3–0.9)
Total Bilirubin: 1.1 mg/dL (ref 0.3–1.2)
Total Protein: 7.4 g/dL (ref 6.5–8.1)

## 2019-01-22 LAB — LIPID PANEL
Cholesterol: 160 mg/dL (ref 0–200)
HDL: 61 mg/dL (ref >40)
LDL Cholesterol: 83 mg/dL (ref 0–99)
Total CHOL/HDL Ratio: 2.6 RATIO
Triglycerides: 80 mg/dL (ref <150)
VLDL: 16 mg/dL (ref 0–40)

## 2019-01-22 LAB — TSH: TSH: 1.36 u[IU]/mL (ref 0.350–4.500)

## 2019-01-22 MED ORDER — OLANZAPINE 10 MG PO TABS
10.0000 mg | ORAL_TABLET | Freq: Every day | ORAL | Status: DC
  Administered 2019-01-22 – 2019-01-23 (×2): 10 mg via ORAL
  Filled 2019-01-22 (×2): qty 1

## 2019-01-22 MED ORDER — INFLUENZA VAC SPLIT QUAD 0.5 ML IM SUSY: 0.5000 mL | PREFILLED_SYRINGE | INTRAMUSCULAR | Status: DC

## 2019-01-22 NOTE — BHH Suicide Risk Assessment (Signed)
[facility] INPATIENT:  Family/Significant Other Suicide Prevention Education  Suicide Prevention Education:  Patient Refusal for Family/Significant Other Suicide Prevention Education: The patient Bradley Casey has refused to provide written consent for family/significant other to be provided Family/Significant Other Suicide Prevention Education during admission and/or prior to discharge.  Physician notified.   SPE completed with pt, as pt refused to consent to family contact. SPI pamphlet provided to pt and pt was encouraged to share information with support network, ask questions, and talk about any concerns relating to SPE. Pt denies access to guns/firearms and verbalized understanding of information provided. Mobile Crisis information also provided to pt.   [place] MSW LCSW 01/22/2019, 10:51 AM

## 2019-01-22 NOTE — Progress Notes (Signed)
Recreation Therapy Notes  INPATIENT RECREATION THERAPY ASSESSMENT  Patient Details Name: Chamberlain Steinborn MRN: [id number] DOB: July 27, 1990 Today's Date: 01/22/2019       Information Obtained From: Patient  Able to Participate in Assessment/Interview: Yes  Patient Presentation: Responsive  Reason for Admission (Per Patient): Other (Comments)(Some one put voodoo on me)  Patient Stressors:    Coping Skills:   Art, Education officer, community  Leisure Interests (2+):  Individual - TV, Music - Listen, Art - Draw, Games - Video games  Frequency of Recreation/Participation: Weekly  Awareness of Community Resources:     [employer]:     Current Use:    If no, Barriers?:    Expressed Interest in [place] of Residence:  Insurance underwriter  Patient Main Form of Transportation: Musician  Patient Strengths:  charismatic,Bold  Patient Identified Areas of Improvement:  My patience  Patient Goal for Hospitalization:  To get a sense of stability,peace and safety.  Current SI (including self-harm):  No  Current HI:  No  Current AVH: No  Staff Intervention Plan: Group Attendance, Collaborate with Interdisciplinary Treatment Team  Consent to Intern Participation: N/A  Ryer Asato 01/22/2019, 2:17 PM

## 2019-01-22 NOTE — BHH Group Notes (Signed)
Feelings Around Diagnosis 01/22/2019 1PM  Type of Therapy/Topic:  Group Therapy:  Feelings about Diagnosis  Participation Level:  Active   Description of Group:   This group will allow patients to explore their thoughts and feelings about diagnoses they have received. Patients will be guided to explore their level of understanding and acceptance of these diagnoses. Facilitator will encourage patients to process their thoughts and feelings about the reactions of others to their diagnosis and will guide patients in identifying ways to discuss their diagnosis with significant others in their lives. This group will be process-oriented, with patients participating in exploration of their own experiences, giving and receiving support, and processing challenge from other group members.   Therapeutic Goals: 1. Patient will demonstrate understanding of diagnosis as evidenced by identifying two or more symptoms of the disorder 2. Patient will be able to express two feelings regarding the diagnosis 3. Patient will demonstrate their ability to communicate their needs through discussion and/or role play  Summary of Patient Progress: Pt actively and appropriately participated in session. Pt did not identify his diagnosis but provided examples when others did. Pt defined schizophrenia as "hearing voices." Pt respected boundaries during session    Therapeutic Modalities:   Cognitive Behavioral Therapy Brief Therapy Feelings Identification    Yvette Rack, LCSW 01/22/2019 2:15 PM

## 2019-01-22 NOTE — Progress Notes (Signed)
Recreation Therapy Notes   Date: 01/22/2019  Time: 9:30 am  Location: Craft room  Behavioral response: Appropriate   Intervention Topic: Strengths  Discussion/Intervention:   Group content today was focused on strengths. The group identified some of the strengths they have. Individuals stated reason why they do not use their strengths. Patients expressed what strengths others see in them. The group identified important reason to use their strengths. The group participated in the intervention "Picking strengths", where they had a chance to identify some of their strengths. Clinical Observations/Feedback:  Patient came to group and identified that his personal strength is being bold.  Individual participated in the intervention and was social with peers and staff during group. Kaylob Wallen LRT/CTRS         Venus Ruhe 01/22/2019 11:58 AM

## 2019-01-22 NOTE — Plan of Care (Signed)
  Problem: Education: Goal: Knowledge of [facility] Education information/materials will improve Outcome: Not Progressing Goal: Emotional status will improve Outcome: Not Progressing Goal: Mental status will improve Outcome: Not Progressing Goal: Verbalization of understanding the information provided will improve Outcome: Not Progressing  D: Patient came in with HI toward "the people who are messing with me" and believes that a woman he met at a party, put a voodoo spell on him. He is loose and tangential, talking about planets and being chosen by this woman as one of six people to populate [place]0 and that she would pay him $2 million dollars to give her his subconscious. He says he was fine before this party he attended on Friday night, and admitted to using cocaine and marijuana, but denies that this has any relationship to his delusions. He says he believes the woman put a voodoo spell on him by putting radiation into his hands and was asking if "regular people" had access to radiation. Denies SI. Says he hears a sound like TV static but denies voices. Skin search with Warner Mccreedy, RN revealed no contraband, wounds or cuts, and multiple tattoos on torso and arms. Contracts for safety. A: Continue to monitor for safety R: Safety maintained.

## 2019-01-22 NOTE — BHH Suicide Risk Assessment (Signed)
[facility] Admission Suicide Risk Assessment   Nursing information obtained from:  Patient Demographic factors:  Male, Adolescent or young adult Current Mental Status:  NA Loss Factors:  NA Historical Factors:  NA Risk Reduction Factors:  Religious beliefs about death  Total Time spent with patient: 1 hour Principal Problem: Psychosis (HCC) Diagnosis:  Principal Problem:   Psychosis (HCC) Active Problems:   Polysubstance abuse (HCC)  Subjective Data: 28 year old man with unclear past psychiatric history presents to the hospital with what appeared to be psychotic delusions and paranoid thinking.  Denies any suicidal thoughts.  Continued Clinical Symptoms:  Alcohol Use Disorder Identification Test Final Score (AUDIT): 3 The "Alcohol Use Disorders Identification Test", Guidelines for Use in Primary Care, Second Edition.  World Science writer [facility]). Score between 0-7:  no or low risk or alcohol related problems. Score between 8-15:  moderate risk of alcohol related problems. Score between 16-19:  high risk of alcohol related problems. Score 20 or above:  warrants further diagnostic evaluation for alcohol dependence and treatment.   CLINICAL FACTORS:   Currently Psychotic   Musculoskeletal: Strength & Muscle Tone: within normal limits Gait & Station: normal Patient leans: N/A  Psychiatric Specialty Exam: Physical Exam  Nursing note and vitals reviewed. Constitutional: He appears well-developed and well-nourished.  HENT:  Head: Normocephalic and atraumatic.  Eyes: Pupils are equal, round, and reactive to light. Conjunctivae are normal.  Neck: Normal range of motion.  Cardiovascular: Regular rhythm and normal heart sounds.  Respiratory: Effort normal.  GI: Soft.  Musculoskeletal: Normal range of motion.  Neurological: He is alert.  Skin: Skin is warm and dry.  Psychiatric: His mood appears anxious. His speech is tangential. He is agitated. He is not aggressive. Thought  content is paranoid and delusional. Cognition and memory are impaired. He expresses inappropriate judgment. He expresses homicidal ideation. He expresses no suicidal ideation. He expresses no homicidal plans.    Review of Systems  Constitutional: Negative.   HENT: Negative.   Eyes: Negative.   Respiratory: Negative.   Cardiovascular: Negative.   Gastrointestinal: Negative.   Musculoskeletal: Negative.   Skin: Negative.   Neurological: Negative.   Psychiatric/Behavioral: Positive for hallucinations, memory loss and substance abuse. Negative for depression and suicidal ideas. The patient is nervous/anxious and has insomnia.     Blood pressure (!) 135/101, pulse 64, temperature 98.4 F (36.9 C), temperature source Oral, resp. rate 18, height 5\' 7"  (1.702 m), weight 68.5 kg, SpO2 100 %.Body mass index is 23.65 kg/m.  General Appearance: Casual  Eye Contact:  Good  Speech:  Clear and Coherent  Volume:  Increased  Mood:  Anxious  Affect:  Congruent  Thought Process:  Coherent  Orientation:  Full (Time, Place, and Person)  Thought Content:  Illogical, Rumination and Tangential  Suicidal Thoughts:  No  Homicidal Thoughts:  Yes.  without intent/plan  Memory:  Immediate;   Fair Recent;   Fair Remote;   Fair  Judgement:  Impaired  Insight:  Shallow  Psychomotor Activity:  Normal  Concentration:  Concentration: Fair  Recall:  of Knowledge:  Fair  Language:  Fair  Akathisia:  No  Handed:  Right  AIMS (if indicated):     Assets:  Desire for Improvement Housing Physical Health Resilience  ADL's:  Intact  Cognition:  Impaired,  Mild  Sleep:  Number of Hours: 2      COGNITIVE FEATURES THAT CONTRIBUTE TO RISK:  Loss of executive function    SUICIDE RISK:  Minimal: No identifiable suicidal ideation.  Patients presenting with no risk factors but with morbid ruminations; may be classified as minimal risk based on the severity of the depressive symptoms  PLAN OF CARE:  15-minute checks for safety.  Try and gain more information and collateral.  Engage patient in individual and group therapy.  Propose starting antipsychotic for what seems like an episode of paranoia.  Make sure we have good outpatient treatment in place prior to discharge  I certify that inpatient services furnished can reasonably be expected to improve the patient's condition.   Alethia Berthold, MD 01/22/2019, 5:55 PM

## 2019-01-22 NOTE — Progress Notes (Signed)
D: Patient came in with HI toward "the people who are messing with me" and believes that a woman he met at a party, put a voodoo spell on him. He is loose and tangential, talking about planets and being chosen by this woman as one of six people to populate [place] and that she would pay him $2 million dollars to give her his subconscious. He says he was fine before this party he attended on Friday night, and admitted to using cocaine and marijuana, but denies that this has any relationship to his delusions. He says he believes the woman put a voodoo spell on him by putting radiation into his hands and was asking if "regular people" had access to radiation. Denies SI. Says he hears a sound like TV static but denies voices. Skin search with Warner Mccreedy, RN revealed no contraband, wounds or cuts, and multiple tattoos on torso and arms. Contracts for safety. A: Continue to monitor for safety R: Safety maintained.

## 2019-01-22 NOTE — Tx Team (Addendum)
Interdisciplinary Treatment and Diagnostic Plan Update  01/22/2019 Time of Session: [address] MRN: [id number]  Principal Diagnosis: <principal problem not specified>  Secondary Diagnoses: Active Problems:   Psychosis ([place])   Current Medications:  Current Facility-Administered Medications  Medication Dose Route Frequency Provider Last Rate Last Dose  . acetaminophen (TYLENOL) tablet 650 mg  650 mg Oral Q6H PRN Cristofano, Dorene Ar, MD      . alum & mag hydroxide-simeth (MAALOX/MYLANTA) 200-200-20 MG/5ML suspension 30 mL  30 mL Oral Q4H PRN Cristofano, Dorene Ar, MD      . Derrill Memo ON 01/23/2019] influenza vac split quadrivalent PF (FLUARIX) injection 0.5 mL  0.5 mL Intramuscular Tomorrow-1000 Clapacs, John T, MD      . LORazepam (ATIVAN) tablet 2 mg  2 mg Oral Q6H PRN Cristofano, Paul A, MD      . magnesium hydroxide (MILK OF MAGNESIA) suspension 30 mL  30 mL Oral Daily PRN Cristofano, Paul A, MD      . risperiDONE (RISPERDAL) tablet 1 mg  1 mg Oral BID PRN Cristofano, Dorene Ar, MD       PTA Medications: Medications Prior to Admission  Medication Sig Dispense Refill Last Dose  . cefUROXime (CEFTIN) 500 MG tablet Take 1 tablet (500 mg total) by mouth 2 (two) times daily. 20 tablet 0   . mupirocin ointment (BACTROBAN) 2 % Apply 1 application topically 3 (three) times daily. 22 g 0   . ondansetron (ZOFRAN) 4 MG tablet Take 1 tablet (4 mg total) by mouth daily as needed for nausea or vomiting. 20 tablet 1     Patient Stressors:    Patient Strengths:    Treatment Modalities: Medication Management, Group therapy, Case management,  1 to 1 session with clinician, Psychoeducation, Recreational therapy.   Physician Treatment Plan for Primary Diagnosis: <principal problem not specified> Long Term Goal(s):     Short Term Goals:    Medication Management: Evaluate patient's response, side effects, and tolerance of medication regimen.  Therapeutic Interventions: 1 to 1 sessions, Unit  Group sessions and Medication administration.  Evaluation of Outcomes: Not Met  Physician Treatment Plan for Secondary Diagnosis: Active Problems:   Psychosis ([place])  Long Term Goal(s):     Short Term Goals:       Medication Management: Evaluate patient's response, side effects, and tolerance of medication regimen.  Therapeutic Interventions: 1 to 1 sessions, Unit Group sessions and Medication administration.  Evaluation of Outcomes: Not Met   RN Treatment Plan for Primary Diagnosis: <principal problem not specified> Long Term Goal(s): Knowledge of disease and therapeutic regimen to maintain health will improve  Short Term Goals: Ability to remain free from injury will improve, Ability to demonstrate self-control, Ability to identify and develop effective coping behaviors will improve and Compliance with prescribed medications will improve  Medication Management: RN will administer medications as ordered by provider, will assess and evaluate patient's response and provide education to patient for prescribed medication. RN will report any adverse and/or side effects to prescribing provider.  Therapeutic Interventions: 1 on 1 counseling sessions, Psychoeducation, Medication administration, Evaluate responses to treatment, Monitor vital signs and CBGs as ordered, Perform/monitor CIWA, COWS, AIMS and Fall Risk screenings as ordered, Perform wound care treatments as ordered.  Evaluation of Outcomes: Not Met   LCSW Treatment Plan for Primary Diagnosis: <principal problem not specified> Long Term Goal(s): Safe transition to appropriate next level of care at discharge, Engage patient in therapeutic group addressing interpersonal concerns.  Short Term Goals: Engage patient  in aftercare planning with referrals and resources, Increase social support, Facilitate acceptance of mental health diagnosis and concerns and Increase skills for wellness and recovery  Therapeutic Interventions: Assess  for all discharge needs, 1 to 1 time with Social worker, Explore available resources and support systems, Assess for adequacy in community support network, Educate family and significant other(s) on suicide prevention, Complete Psychosocial Assessment, Interpersonal group therapy.  Evaluation of Outcomes: Not Met   Progress in Treatment: Attending groups: Yes. Participating in groups: No. Taking medication as prescribed: Yes. Toleration medication: Yes. Family/Significant other contact made: No, will contact:  pt declined collateral contact Patient understands diagnosis: No. Discussing patient identified problems/goals with staff: Yes. Medical problems stabilized or resolved: Yes. Denies suicidal/homicidal ideation: Yes. Issues/concerns per patient self-inventory: No. Other: N/A  New problem(s) identified: No, Describe:  none  New Short Term/Long Term Goal(s): Detox, elimination of AVH/symptoms of psychosis, medication management for mood stabilization; elimination of SI thoughts; development of comprehensive mental wellness/sobriety plan.   Patient Goals:  "A sense of peace, safety and stability"  Discharge Plan or Barriers: SPE pamphlet, Mobile Crisis information, and AA/NA information provided to patient for additional community support and resources. Pt is agreeable to referral to [facility].  Reason for Continuation of Hospitalization: Delusions  Medication stabilization  Estimated Length of Stay: 5-7 days  Recreational Therapy: Patient Stressors: N/A  Patient Goal: Patient will engage in groups without prompting or encouragement from LRT x3 group sessions within 5 recreation therapy group sessions  Attendees: Patient: Bradley Casey 01/22/2019 10:48 AM  Physician: Dr Weber Cooks MD 01/22/2019 10:48 AM  Nursing: Polly Cobia RN 01/22/2019 10:48 AM  RN Care Manager: 01/22/2019 10:48 AM  Social Worker: Minette Brine Moton LCSW 01/22/2019 10:48 AM  Recreational Therapist: [place]  01/22/2019 10:48 AM  Other: Sanjuana Kava LCSW  01/22/2019 10:48 AM  Other:  01/22/2019 10:48 AM  Other: 01/22/2019 10:48 AM    Scribe for Treatment Team: [place], LCSW 01/22/2019 10:48 AM

## 2019-01-22 NOTE — Plan of Care (Signed)
Pt denies depression, hopelessness, SI, HI and AVH. Pt rates anxiety 3/10. Pt denies SI, HI and AVH. Pt was educated on care plan and verbalizes understanding. Collier Bullock RN Problem: Education: Goal: Knowledge of [facility] Education information/materials will improve Outcome: Progressing Goal: Emotional status will improve Outcome: Progressing Goal: Mental status will improve Outcome: Progressing Goal: Verbalization of understanding the information provided will improve Outcome: Progressing   Problem: Activity: Goal: Interest or engagement in activities will improve Outcome: Progressing Goal: Sleeping patterns will improve Outcome: Progressing   Problem: Coping: Goal: Ability to verbalize frustrations and anger appropriately will improve Outcome: Progressing Goal: Ability to demonstrate self-control will improve Outcome: Progressing   Problem: Health Behavior/Discharge Planning: Goal: Identification of resources available to assist in meeting health care needs will improve Outcome: Progressing Goal: Compliance with treatment plan for underlying cause of condition will improve Outcome: Progressing   Problem: Physical Regulation: Goal: Ability to maintain clinical measurements within normal limits will improve Outcome: Progressing   Problem: Safety: Goal: Periods of time without injury will increase Outcome: Progressing   Problem: Activity: Goal: Will verbalize the importance of balancing activity with adequate rest periods Outcome: Progressing   Problem: Education: Goal: Will be free of psychotic symptoms Outcome: Progressing Goal: Knowledge of the prescribed therapeutic regimen will improve Outcome: Progressing   Problem: Coping: Goal: Coping ability will improve Outcome: Progressing Goal: Will verbalize feelings Outcome: Progressing   Problem: Health Behavior/Discharge Planning: Goal: Compliance with prescribed medication regimen will  improve Outcome: Progressing   Problem: Nutritional: Goal: Ability to achieve adequate nutritional intake will improve Outcome: Progressing   Problem: Role Relationship: Goal: Ability to communicate needs accurately will improve Outcome: Progressing Goal: Ability to interact with others will improve Outcome: Progressing   Problem: Safety: Goal: Ability to redirect hostility and anger into socially appropriate behaviors will improve Outcome: Progressing Goal: Ability to remain free from injury will improve Outcome: Progressing   Problem: Self-Care: Goal: Ability to participate in self-care as condition permits will improve Outcome: Progressing   Problem: Self-Concept: Goal: Will verbalize positive feelings about self Outcome: Progressing
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
## 2019-01-22 NOTE — H&P (Signed)
Psychiatric Admission Assessment Adult   Patient Identification: Bradley Casey MRN:  [id number] Date of Evaluation:  01/22/2019 Chief Complaint:  Psychosis Principal Diagnosis: Psychosis ([place]) Diagnosis:  Principal Problem:   Psychosis ([place]) Active Problems:   Polysubstance abuse ([place])  History of Present Illness: Patient seen chart reviewed.  This is a 28 year old man who came to the emergency room reporting that he felt like people were working [employer] on him and were out to get him.  Patient tells a long detailed story about how this past Friday night he ran into a woman he had never met before at a friend's house.  The woman started talking about bizarre things like having chips implanted under her skin and space and other planets and magic.  Patient seems like he was initially interested but gradually felt more distressed and upset.  Saturday he went to watch the [place] and afterwards ran into this woman again from what I understand.  He felt like she was offering him vast sums of money and rewards if he would "give up my soul".  He also is fixated on the idea that she might be from another planet and talks about how while he did not make any deal with her he is starting to feel like maybe he should go to another planet.  Patient admits that he uses marijuana cocaine and alcohol regularly but insists that he has not had any change in those habits and he does not feel like his current symptoms have anything to do with his drug use.  Patient is talkative but not necessarily pressured.  No sign of anger or bizarre behavior during the interview.  He has at least some understanding that what he is talking about sounds bizarre but was willing to go on in detail about it.  He denies any suicidal thoughts but says that he has had some homicidal thoughts although he will not say what that means or towards 2.  Sleep sounds like it varies a lot from day-to-day.  I get the impression that the patient  lives a somewhat "unconventional" lifestyle and may have pretty chaotic hours.  He is not currently receiving any kind of psychiatric treatment.  He has no medical problems he knows of. Associated Signs/Symptoms: Depression Symptoms:  insomnia, difficulty concentrating, (Hypo) Manic Symptoms:  Flight of Ideas, Grandiosity, Impulsivity, Anxiety Symptoms:  Excessive Worry, Psychotic Symptoms:  Delusions, Ideas of Reference, Paranoia, PTSD Symptoms: Negative Total Time spent with patient: 1 hour  Past Psychiatric History: Patient insists he has had no previous psychiatric treatment whatsoever.  Never been on any medication.  Never been seen by a therapist or psychiatrist in the past.  No history of suicide attempts.  Denies violence.  Is the patient at risk to self? No.  Has the patient been a risk to self in the past 6 months? No.  Has the patient been a risk to self within the distant past? No.  Is the patient a risk to others? Yes.    Has the patient been a risk to others in the past 6 months? No.  Has the patient been a risk to others within the distant past? No.   Prior Inpatient Therapy:   Prior Outpatient Therapy:    Alcohol Screening: 1. How often do you have a drink containing alcohol?: 2 to 4 times a month 2. How many drinks containing alcohol do you have on a typical day when you are drinking?: 3 or 4 3. How often do  you have six or more drinks on one occasion?: Never AUDIT-C Score: 3 4. How often during the last year have you found that you were not able to stop drinking once you had started?: Never 5. How often during the last year have you failed to do what was normally expected from you becasue of drinking?: Never 6. How often during the last year have you needed a first drink in the morning to get yourself going after a heavy drinking session?: Never 7. How often during the last year have you had a feeling of guilt of remorse after drinking?: Never 8. How often during  the last year have you been unable to remember what happened the night before because you had been drinking?: Never 9. Have you or someone else been injured as a result of your drinking?: No 10. Has a relative or friend or a doctor or another health worker been concerned about your drinking or suggested you cut down?: No Alcohol Use Disorder Identification Test Final Score (AUDIT): 3 Alcohol Brief Interventions/Follow-up: Brief Advice Substance Abuse History in the last 12 months:  Yes.   Consequences of Substance Abuse: Medical Consequences:  Seems a little unclear.  He insists that the drugs do not cause any problems for him. Previous Psychotropic Medications: No  Psychological Evaluations: No  Past Medical History: History reviewed. No pertinent past medical history. History reviewed. No pertinent surgical history. Family History: History reviewed. No pertinent family history. Family Psychiatric  History: Denies any Tobacco Screening: Have you used any form of tobacco in the last 30 days? (Cigarettes, Smokeless Tobacco, Cigars, and/or Pipes): Yes Tobacco use, Select all that apply: 5 or more cigarettes per day Are you interested in Tobacco Cessation Medications?: Yes, will notify MD for an order Counseled patient on smoking cessation including recognizing danger situations, developing coping skills and basic information about quitting provided: Yes Social History:  Social History   Substance and Sexual Activity  Alcohol Use Yes     Social History   Substance and Sexual Activity  Drug Use Yes  . Types: Marijuana, Cocaine    Additional Social History: Marital status: Single Are you sexually active?: Yes What is your sexual orientation?: heterosexual Does patient have children?: Yes How many children?: 1 How is patient's relationship with their children?: Pt has an 50monthold daughter who he reports a great relationship with and he sees her every weekend                          Allergies:  No Known Allergies Lab Results:  Results for orders placed or performed during the hospital encounter of 01/21/19 (from the past 48 hour(s))  Hepatic function panel     Status: None   Collection Time: 01/22/19  7:02 AM  Result Value Ref Range   Total Protein 7.4 6.5 - 8.1 g/dL   Albumin 4.6 3.5 - 5.0 g/dL   AST 23 15 - 41 U/L   ALT 15 0 - 44 U/L   Alkaline Phosphatase 66 38 - 126 U/L   Total Bilirubin 1.1 0.3 - 1.2 mg/dL   Bilirubin, Direct 0.2 0.0 - 0.2 mg/dL   Indirect Bilirubin 0.9 0.3 - 0.9 mg/dL    Comment: Performed at [facility] [address] [place], [place] [id number] Lipid panel     Status: None   Collection Time: 01/22/19  7:02 AM  Result Value Ref Range   Cholesterol 160 0 - 200  mg/dL   Triglycerides 80 <150 mg/dL   HDL 61 >40 mg/dL   Total CHOL/HDL Ratio 2.6 RATIO   VLDL 16 0 - 40 mg/dL   LDL Cholesterol 83 0 - 99 mg/dL    Comment:        Total Cholesterol/HDL:CHD Risk Coronary Heart Disease Risk Table                     Men   Women  1/2 Average Risk   3.4   3.3  Average Risk       5.0   4.4  2 X Average Risk   9.6   7.1  3 X Average Risk  23.4   11.0        Use the calculated Patient Ratio above and the CHD Risk Table to determine the patient's CHD Risk.        ATP III CLASSIFICATION (LDL):  <100     mg/dL   Optimal  100-129  mg/dL   Near or Above                    Optimal  130-159  mg/dL   Borderline  160-189  mg/dL   High  >190     mg/dL   Very High Performed at [facility], [address] [place], [place] [address]   TSH     Status: None   Collection Time: 01/22/19  7:02 AM  Result Value Ref Range   TSH 1.360 0.350 - 4.500 uIU/mL    Comment: Performed by a 3rd Generation assay with a functional sensitivity of <=0.01 uIU/mL. Performed at [facility], [address] [place], [place] [address]     Blood Alcohol level:  Lab Results  Component Value Date   ETH 13 (H) 27/78/2423     Metabolic Disorder Labs:  No results found for: HGBA1C, MPG No results found for: PROLACTIN Lab Results  Component Value Date   CHOL 160 01/22/2019   TRIG 80 01/22/2019   HDL 61 01/22/2019   CHOLHDL 2.6 01/22/2019   VLDL 16 01/22/2019   LDLCALC 83 01/22/2019    Current Medications: Current Facility-Administered Medications  Medication Dose Route Frequency Provider Last Rate Last Dose  . acetaminophen (TYLENOL) tablet 650 mg  650 mg Oral Q6H PRN Cristofano, Dorene Ar, MD      . alum & mag hydroxide-simeth (MAALOX/MYLANTA) 200-200-20 MG/5ML suspension 30 mL  30 mL Oral Q4H PRN Cristofano, Dorene Ar, MD      . Derrill Memo ON 01/23/2019] influenza vac split quadrivalent PF (FLUARIX) injection 0.5 mL  0.5 mL Intramuscular Tomorrow-1000 ,  T, MD      . LORazepam (ATIVAN) tablet 2 mg  2 mg Oral Q6H PRN Cristofano, Paul A, MD      . magnesium hydroxide (MILK OF MAGNESIA) suspension 30 mL  30 mL Oral Daily PRN Cristofano, Dorene Ar, MD      . OLANZapine (ZYPREXA) tablet 10 mg  10 mg Oral QHS ,  T, MD      . risperiDONE (RISPERDAL) tablet 1 mg  1 mg Oral BID PRN Cristofano, Dorene Ar, MD       PTA Medications: Medications Prior to Admission  Medication Sig Dispense Refill Last Dose  . cefUROXime (CEFTIN) 500 MG tablet Take 1 tablet (500 mg total) by mouth 2 (two) times daily. 20 tablet 0   . mupirocin ointment (BACTROBAN) 2 % Apply 1 application topically 3 (three) times daily.  22 g 0   . ondansetron (ZOFRAN) 4 MG tablet Take 1 tablet (4 mg total) by mouth daily as needed for nausea or vomiting. 20 tablet 1     Musculoskeletal: Strength & Muscle Tone: within normal limits Gait & Station: normal Patient leans: N/A  Psychiatric Specialty Exam: Physical Exam  Nursing note and vitals reviewed. Constitutional: He appears well-developed and well-nourished.  HENT:  Head: Normocephalic and atraumatic.  Eyes: Pupils are equal, round, and reactive to light. Conjunctivae are normal.   Neck: Normal range of motion.  Cardiovascular: Regular rhythm and normal heart sounds.  Respiratory: Effort normal.  GI: Soft.  Musculoskeletal: Normal range of motion.  Neurological: He is alert.  Skin: Skin is warm and dry.  Psychiatric: His mood appears anxious. His affect is labile. His speech is rapid and/or pressured. He is agitated. He is not aggressive. Thought content is paranoid and delusional. Cognition and memory are impaired. He expresses impulsivity. He expresses homicidal ideation. He expresses no homicidal plans.    Review of Systems  Constitutional: Negative.   HENT: Negative.   Eyes: Negative.   Respiratory: Negative.   Cardiovascular: Negative.   Gastrointestinal: Negative.   Musculoskeletal: Negative.   Skin: Negative.   Neurological: Negative.   Psychiatric/Behavioral: Positive for substance abuse. Negative for depression, hallucinations and suicidal ideas. The patient is nervous/anxious and has insomnia.     Blood pressure (!) 135/101, pulse 64, temperature 98.4 F (36.9 C), temperature source Oral, resp. rate 18, height '5\' 7"'  (1.702 m), weight 68.5 kg, SpO2 100 %.Body mass index is 23.65 kg/m.  General Appearance: Patient is covered with a wide variety of tattoos some of them rather bizarre and they include face and neck tattoos.  Its dramatic enough that I thought it was worth mentioning.  Otherwise he seems in normal health and there is nothing unusual about his appearance.  Eye Contact:  Good  Speech:  Clear and Coherent and Pressured  Volume:  Increased  Mood:  Anxious  Affect:  Congruent  Thought Process:  Disorganized  Orientation:  Full (Time, Place, and Person)  Thought Content:  Illogical, Delusions, Paranoid Ideation, Rumination and Tangential  Suicidal Thoughts:  No  Homicidal Thoughts:  Yes.  without intent/plan  Memory:  Immediate;   Fair Recent;   Fair Remote;   Fair  Judgement:  Impaired  Insight:  Shallow  Psychomotor Activity:  Normal   Concentration:  Concentration: Fair  Recall:  [employer] of Knowledge:  Fair  Language:  Fair  Akathisia:  No  Handed:  Right  AIMS (if indicated):     Assets:  Communication Skills Desire for Improvement Housing Physical Health Resilience  ADL's:  Intact  Cognition:  WNL  Sleep:  Number of Hours: 2    Treatment Plan Summary: Daily contact with patient to assess and evaluate symptoms and progress in treatment, Medication management and Plan 28 year old man with no past psychiatric history presents with several days of what sounds like psychotic thinking.  He is positive for cocaine marijuana and alcohol but swears that he does not think that has anything to do with it.  He has some insight into how bizarre his history sounds but continues to insist that it is true.  He has been cooperative and pleasant here on the unit and does not seem to be hyperactive or driven in a manic sort of way.  Unclear if this is all substance induced or could be related to an underlying mental illness.  I  did propose that we start a low-dose of olanzapine at night to help with sleep which he was tentatively agreeable to.  Continue to engage in individual and group therapy and have ongoing assessment.  Observation Level/Precautions:  15 minute checks  Laboratory:  Chemistry Profile  Psychotherapy:    Medications:    Consultations:    Discharge Concerns:    Estimated LOS:  Other:     Physician Treatment Plan for Primary Diagnosis: Psychosis ([place]) Long Term Goal(s): Improvement in symptoms so as ready for discharge  Short Term Goals: Ability to verbalize feelings will improve and Ability to demonstrate self-control will improve  Physician Treatment Plan for Secondary Diagnosis: Principal Problem:   Psychosis ([place]) Active Problems:   Polysubstance abuse ([place])  Long Term Goal(s): Improvement in symptoms so as ready for discharge  Short Term Goals: Ability to maintain clinical measurements within  normal limits will improve and Compliance with prescribed medications will improve  I certify that inpatient services furnished can reasonably be expected to improve the patient's condition.    Alethia Berthold, MD 12/1/20205:58 PM

## 2019-01-23 NOTE — Progress Notes (Signed)
Recreation Therapy Notes Date: 01/23/2019  Time: 9:30 am   Location: Craft room   Behavioral response: N/A   Intervention Topic: Coping skills   Discussion/Intervention: Patient did not attend group.   Clinical Observations/Feedback:  Patient did not attend group.   Kynlea Blackston LRT/CTRS      Shyquan Stallbaumer 01/23/2019 10:33 AM

## 2019-01-23 NOTE — Plan of Care (Signed)
Patient  Is knowledgeable of information received, regarding [place]  Information. Emotional and mental status improved  Attending unit programing . Voice no concerns around  Sleep or wake cycles. No anger outburst  Or inappropriate venting of frustrations,  Able to maintain self control  No safety concerns . Thought process remained Altered Able to verbalize understanding of medications . Working on coping , decision making and anxiety.    Problem: Education: Goal: Knowledge of  General Education information/materials will improve Outcome: Progressing Goal: Emotional status will improve Outcome: Progressing Goal: Mental status will improve Outcome: Progressing Goal: Verbalization of understanding the information provided will improve Outcome: Progressing   Problem: Activity: Goal: Interest or engagement in activities will improve Outcome: Progressing Goal: Sleeping patterns will improve Outcome: Progressing   Problem: Coping: Goal: Ability to verbalize frustrations and anger appropriately will improve Outcome: Progressing Goal: Ability to demonstrate self-control will improve Outcome: Progressing   Problem: Health Behavior/Discharge Planning: Goal: Identification of resources available to assist in meeting health care needs will improve Outcome: Progressing Goal: Compliance with treatment plan for underlying cause of condition will improve Outcome: Progressing   Problem: Physical Regulation: Goal: Ability to maintain clinical measurements within normal limits will improve Outcome: Progressing   Problem: Safety: Goal: Periods of time without injury will increase Outcome: Progressing   Problem: Safety: Goal: Periods of time without injury will increase Outcome: Progressing   Problem: Activity: Goal: Will verbalize the importance of balancing activity with adequate rest periods Outcome: Progressing   Problem: Education: Goal: Will be free of psychotic  symptoms Outcome: Progressing Goal: Knowledge of the prescribed therapeutic regimen will improve Outcome: Progressing   Problem: Coping: Goal: Coping ability will improve Outcome: Progressing Goal: Will verbalize feelings Outcome: Progressing   Problem: Health Behavior/Discharge Planning: Goal: Compliance with prescribed medication regimen will improve Outcome: Progressing   Problem: Nutritional: Goal: Ability to achieve adequate nutritional intake will improve Outcome: Progressing   Problem: Role Relationship: Goal: Ability to communicate needs accurately will improve Outcome: Progressing Goal: Ability to interact with others will improve Outcome: Progressing   Problem: Safety: Goal: Ability to redirect hostility and anger into socially appropriate behaviors will improve Outcome: Progressing Goal: Ability to remain free from injury will improve Outcome: Progressing   Problem: Self-Care: Goal: Ability to participate in self-care as condition permits will improve Outcome: Progressing   Problem: Self-Concept: Goal: Will verbalize positive feelings about self Outcome: Progressing

## 2019-01-23 NOTE — Plan of Care (Signed)
Pt seen in the milieu interacting with peers. Pt appears very friendly and engages/sits with a "particular male patient". Pt stated he is having a good day generally, but feels a little anxious and needs something to calm his nerves down. Pt given medications as needed. Q15 minute safety checks maintained. Pt denies SI/HI/AVH.  Problem: Education: Goal: Knowledge of [facility] Education information/materials will improve Outcome: Progressing Goal: Emotional status will improve Outcome: Progressing Goal: Mental status will improve Outcome: Progressing Goal: Verbalization of understanding the information provided will improve Outcome: Progressing   Problem: Activity: Goal: Interest or engagement in activities will improve Outcome: Progressing Goal: Sleeping patterns will improve Outcome: Progressing   Problem: Coping: Goal: Ability to verbalize frustrations and anger appropriately will improve Outcome: Progressing Goal: Ability to demonstrate self-control will improve Outcome: Progressing   Problem: Health Behavior/Discharge Planning: Goal: Identification of resources available to assist in meeting health care needs will improve Outcome: Progressing Goal: Compliance with treatment plan for underlying cause of condition will improve Outcome: Progressing   Problem: Physical Regulation: Goal: Ability to maintain clinical measurements within normal limits will improve Outcome: Progressing   Problem: Safety: Goal: Periods of time without injury will increase Outcome: Progressing   Problem: Activity: Goal: Will verbalize the importance of balancing activity with adequate rest periods Outcome: Progressing   Problem: Education: Goal: Will be free of psychotic symptoms Outcome: Progressing Goal: Knowledge of the prescribed therapeutic regimen will improve Outcome: Progressing   Problem: Coping: Goal: Coping ability will improve Outcome: Progressing Goal: Will  verbalize feelings Outcome: Progressing   Problem: Health Behavior/Discharge Planning: Goal: Compliance with prescribed medication regimen will improve Outcome: Progressing   Problem: Nutritional: Goal: Ability to achieve adequate nutritional intake will improve Outcome: Progressing   Problem: Role Relationship: Goal: Ability to communicate needs accurately will improve Outcome: Progressing Goal: Ability to interact with others will improve Outcome: Progressing   Problem: Safety: Goal: Ability to redirect hostility and anger into socially appropriate behaviors will improve Outcome: Progressing Goal: Ability to remain free from injury will improve Outcome: Progressing   Problem: Self-Care: Goal: Ability to participate in self-care as condition permits will improve Outcome: Progressing   Problem: Self-Concept: Goal: Will verbalize positive feelings about self Outcome: Progressing

## 2019-01-23 NOTE — Progress Notes (Signed)
D:Psychosis  A: Patient  Note to sleep most of am shift .Questioned the medication received last night . Voice of being half sleep when [facility] representative came to talk with him this am . " I don't know if he was coming to see how that medicine worked or not " Informed  patient  [place]  Is not prevey to that information. Patient stated th e woman  That led to his  Thoughts of having magic  Worked on him has not been seen  Since being on this unit.  Patient still considers this being a real event . No unit programing  This am shift .  Affect cheerful on approach  Interacting  With peers and staff . Appropriate ADL's and personal chores . Denies suicidal ideations.Patient  Is knowledgeable of information received, regarding [place]  Information. Emotional and mental status improved  Attending unit programing . Voice no concerns around  Sleep or wake cycles. No anger outburst  Or inappropriate venting of frustrations,  Able to maintain self control  No safety concerns . Thought process remained Altered Able to verbalize understanding of medications . Working on coping , decision making and anxiety.   R: Voice no other concerns. Staff continue to monitor

## 2019-01-23 NOTE — Progress Notes (Signed)
[facility] MD Progress Note  01/23/2019 3:37 PM Bradley Casey  MRN:  [id number]   Subjective: There is a follow-up with the patient being seen for psychosis.  Patient reports that he feels that the medication he took was a little too strong last night and he slept too long today and it made him too drowsy in the morning.  He is requesting for the medication to be decreased some.  Patient then continues to detail his story about what it happened with the woman recently and he also details another story that happened approximately 5 years ago that involved a man who had some type of "magical drink" and it made him freeze up and he could not move any of his body.  He reports that he does not understand what is going on with him and he does not think that it is mental health but he also does not think that it is due to his substance abuse.  He states that he does not want to be stuck here in the "not house" but wants to know exactly what is going on.  He denies any suicidal homicidal ideations and denies any hallucinations or speaking to her seeing any of these people from previous why he has been here at the hospital.  He does report that he does not want to leave the hospital because he is afraid that once he leaves he will show up on a missing persons report tomorrow because they have taken him away to another planet.  I did contact the patient's mother, Turrell Severt, and asked what she had experienced with him prior to bringing to the hospital.  The patient's mother reported almost the identical story that he was extremely afraid and seem paranoid that people were coming after him and that he said people were trying to convince him to go and help populate [place].  She states that he was becoming so paranoid that she was unable to help control him and thought that getting to the hospital was the best thing for him.  She also reports that she does not know of any family history of mental health and she does not  even know about the events that happened 5 years ago that the patient reported to me today.  Principal Problem: Psychosis ([place]) Diagnosis: Principal Problem:   Psychosis ([place]) Active Problems:   Polysubstance abuse ([place])  Total Time spent with patient: 20 minutes  Past Psychiatric History: Patient insists he has had no previous psychiatric treatment whatsoever.  Never been on any medication.  Never been seen by a therapist or psychiatrist in the past.  No history of suicide attempts.  Denies violence.  Past Medical History: History reviewed. No pertinent past medical history. History reviewed. No pertinent surgical history. Family History: History reviewed. No pertinent family history. Family Psychiatric  History: None known Social History:  Social History   Substance and Sexual Activity  Alcohol Use Yes     Social History   Substance and Sexual Activity  Drug Use Yes  . Types: Marijuana, Cocaine    Social History   Socioeconomic History  . Marital status: Single    Spouse name: Not on file  . Number of children: Not on file  . Years of education: Not on file  . Highest education level: Not on file  Occupational History  . Not on file  Social Needs  . Financial resource strain: Not on file  . Food insecurity    Worry: Not on  file    Inability: Not on file  . Transportation needs    Medical: Not on file    Non-medical: Not on file  Tobacco Use  . Smoking status: Current Some Day Smoker  . Smokeless tobacco: Never Used  Substance and Sexual Activity  . Alcohol use: Yes  . Drug use: Yes    Types: Marijuana, Cocaine  . Sexual activity: Not on file  Lifestyle  . Physical activity    Days per week: Not on file    Minutes per session: Not on file  . Stress: Not on file  Relationships  . Social Musician on phone: Not on file    Gets together: Not on file    Attends religious service: Not on file    Active member of club or organization: Not on file     Attends meetings of clubs or organizations: Not on file    Relationship status: Not on file  Other Topics Concern  . Not on file  Social History Narrative  . Not on file   Additional Social History:                         Sleep: Good  Appetite:  Good  Current Medications: Current Facility-Administered Medications  Medication Dose Route Frequency Provider Last Rate Last Dose  . acetaminophen (TYLENOL) tablet 650 mg  650 mg Oral Q6H PRN Cristofano, Worthy Rancher, MD      . alum & mag hydroxide-simeth (MAALOX/MYLANTA) 200-200-20 MG/5ML suspension 30 mL  30 mL Oral Q4H PRN Cristofano, Paul A, MD      . influenza vac split quadrivalent PF (FLUARIX) injection 0.5 mL  0.5 mL Intramuscular Tomorrow-1000 Clapacs, John T, MD      . LORazepam (ATIVAN) tablet 2 mg  2 mg Oral Q6H PRN Cristofano, Paul A, MD      . magnesium hydroxide (MILK OF MAGNESIA) suspension 30 mL  30 mL Oral Daily PRN Cristofano, Worthy Rancher, MD      . OLANZapine (ZYPREXA) tablet 10 mg  10 mg Oral QHS Clapacs, [name], MD   10 mg at 01/22/19 2147  . risperiDONE (RISPERDAL) tablet 1 mg  1 mg Oral BID PRN Cristofano, Worthy Rancher, MD   1 mg at 01/22/19 2147    Lab Results:  Results for orders placed or performed during the hospital encounter of 01/21/19 (from the past 48 hour(s))  Hepatic function panel     Status: None   Collection Time: 01/22/19  7:02 AM  Result Value Ref Range   Total Protein 7.4 6.5 - 8.1 g/dL   Albumin 4.6 3.5 - 5.0 g/dL   AST 23 15 - 41 U/L   ALT 15 0 - 44 U/L   Alkaline Phosphatase 66 38 - 126 U/L   Total Bilirubin 1.1 0.3 - 1.2 mg/dL   Bilirubin, Direct 0.2 0.0 - 0.2 mg/dL   Indirect Bilirubin 0.9 0.3 - 0.9 mg/dL    Comment: Performed at [facility] [place] [facility], [address] [place], [place] [address]  Lipid panel     Status: None   Collection Time: 01/22/19  7:02 AM  Result Value Ref Range   Cholesterol 160 0 - 200 mg/dL   Triglycerides 80 <191 mg/dL   HDL 61 >47 mg/dL   Total CHOL/HDL  Ratio 2.6 RATIO   VLDL 16 0 - 40 mg/dL   LDL Cholesterol 83 0 - 99 mg/dL    Comment:  Total Cholesterol/HDL:CHD Risk Coronary Heart Disease Risk Table                     Men   Women  1/2 Average Risk   3.4   3.3  Average Risk       5.0   4.4  2 X Average Risk   9.6   7.1  3 X Average Risk  23.4   11.0        Use the calculated Patient Ratio above and the CHD Risk Table to determine the patient's CHD Risk.        ATP III CLASSIFICATION (LDL):  <100     mg/dL   Optimal  409-735  mg/dL   Near or Above                    Optimal  130-159  mg/dL   Borderline  329-924  mg/dL   High  >268     mg/dL   Very High Performed at [facility], [address] [place], [place] [address]   TSH     Status: None   Collection Time: 01/22/19  7:02 AM  Result Value Ref Range   TSH 1.360 0.350 - 4.500 uIU/mL    Comment: Performed by a 3rd Generation assay with a functional sensitivity of <=0.01 uIU/mL. Performed at [facility], [address] [place], [place] [address]     Blood Alcohol level:  Lab Results  Component Value Date   ETH 13 (H) 01/21/2019    Metabolic Disorder Labs: No results found for: HGBA1C, MPG No results found for: PROLACTIN Lab Results  Component Value Date   CHOL 160 01/22/2019   TRIG 80 01/22/2019   HDL 61 01/22/2019   CHOLHDL 2.6 01/22/2019   VLDL 16 01/22/2019   LDLCALC 83 01/22/2019    Physical Findings: AIMS: Facial and Oral Movements Muscles of Facial Expression: None, normal Lips and Perioral Area: None, normal Jaw: None, normal Tongue: None, normal,Extremity Movements Upper (arms, wrists, hands, fingers): None, normal Lower (legs, knees, ankles, toes): None, normal, Trunk Movements Neck, shoulders, hips: None, normal, Overall Severity Severity of abnormal movements (highest score from questions above): None, normal Incapacitation due to abnormal movements: None, normal Patient's awareness of abnormal movements (rate  only patient's report): No Awareness, Dental Status Current problems with teeth and/or dentures?: No Does patient usually wear dentures?: No  CIWA:    COWS:     Musculoskeletal: Strength & Muscle Tone: within normal limits Gait & Station: normal Patient leans: N/A  Psychiatric Specialty Exam: Physical Exam  Nursing note and vitals reviewed. Constitutional: He is oriented to person, place, and time. He appears well-developed and well-nourished.  Cardiovascular: Normal rate.  Respiratory: Effort normal.  Musculoskeletal: Normal range of motion.  Neurological: He is alert and oriented to person, place, and time.  Skin: Skin is warm.  Psychiatric: His mood appears anxious. Thought content is paranoid.    Review of Systems  Constitutional: Negative.   HENT: Negative.   Eyes: Negative.   Respiratory: Negative.   Cardiovascular: Negative.   Gastrointestinal: Negative.   Genitourinary: Negative.   Musculoskeletal: Negative.   Skin: Negative.   Neurological: Negative.   Endo/Heme/Allergies: Negative.   Psychiatric/Behavioral: The patient is nervous/anxious.        Reports feeling paranoid that he will be taken to [place] and be on the missing persons report if he leaves the building    Blood pressure Marland Kitchen)  143/95, pulse (!) 52, temperature 97.6 F (36.4 C), temperature source Oral, resp. rate 16, height 5\' 7"  (1.702 m), weight 68.5 kg, SpO2 100 %.Body mass index is 23.65 kg/m.  General Appearance: Casual  Eye Contact:  Good  Speech:  Clear and Coherent and Normal Rate  Volume:  Normal  Mood:  Anxious  Affect:  Congruent  Thought Process:  Linear and Descriptions of Associations: Circumstantial  Orientation:  Full (Time, Place, and Person)  Thought Content:  Paranoid Ideation  Suicidal Thoughts:  No  Homicidal Thoughts:  No  Memory:  Immediate;   Good Recent;   Good Remote;   Good  Judgement:  Fair  Insight:  Fair  Psychomotor Activity:  Normal  Concentration:   Concentration: Good  Recall:  Fair  Fund of Knowledge:  Fair  Language:  Good  Akathisia:  No  Handed:  Right  AIMS (if indicated):     Assets:  Communication Skills Desire for Improvement Housing Resilience Social Support Transportation  ADL's:  Intact  Cognition:  WNL  Sleep:  Number of Hours: 6   Assessment: Patient presents in the day room has been interacting with peers and staff appropriately.  Patient appears to still be fixed on this delusion or hallucination that he had about people trying to take him to another planet.  He is still very paranoid and states that he is in no hurry to leave the hospital because he feels safe here.  He is in agreement to take the medications that he is prescribed after discovering that he received Zyprexa and Risperdal last night feel that that may have been the reason that he slept so long this morning.  I have notified the nursing staff to inform night shift only give the Zyprexa unless the patient is showing some acute signs of psychosis.  Patient is very open to logical conversation as well as understanding that this could be due to substance abuse, mental health, or someone is really manipulating him and believing that all of this was real.  Patient states that he is unable to distinguish and a lengthy explanation was given to describe the mental health issues of possible psychosis with schizophrenia or schizoaffective disorder as well as the combination of substance abuse.  Treatment Plan Summary: Daily contact with patient to assess and evaluate symptoms and progress in treatment and Medication management  Continue Ativan 2 mg p.o. every 6 hours as needed for anxiety Continue Zyprexa 10 mg p.o. nightly for psychosis Continue Risperdal 1 mg p.o. twice daily as needed for acute psychotic symptoms Encourage group therapy participation Continue every 15 minute safety checks  Maryfrances Bunnellravis B Malala Trenkamp, FNP 01/23/2019, 3:37 PM

## 2019-01-23 NOTE — BHH Group Notes (Signed)
Emotional Regulation 01/23/2019 1PM  Type of Therapy/Topic:  Group Therapy:  Emotion Regulation  Participation Level:  None   Description of Group:   The purpose of this group is to assist patients in learning to regulate negative emotions and experience positive emotions. Patients will be guided to discuss ways in which they have been vulnerable to their negative emotions. These vulnerabilities will be juxtaposed with experiences of positive emotions or situations, and patients will be challenged to use positive emotions to combat negative ones. Special emphasis will be placed on coping with negative emotions in conflict situations, and patients will process healthy conflict resolution skills.  Therapeutic Goals: 1. Patient will identify two positive emotions or experiences to reflect on in order to balance out negative emotions 2. Patient will label two or more emotions that they find the most difficult to experience 3. Patient will demonstrate positive conflict resolution skills through discussion and/or role plays  Summary of Patient Progress:  No participation or input provided. Pt sat quietly during session.     Therapeutic Modalities:   Cognitive Behavioral Therapy Feelings Identification Dialectical Behavioral Therapy   Yvette Rack, LCSW 01/23/2019 2:24 PM

## 2019-01-24 MED ORDER — OLANZAPINE 7.5 MG PO TABS: 7.5000 mg | ORAL_TABLET | Freq: Every day | ORAL | 0 refills | Status: DC

## 2019-01-24 MED ORDER — OLANZAPINE 7.5 MG PO TABS
7.5000 mg | ORAL_TABLET | Freq: Every day | ORAL | Status: DC
  Administered 2019-01-24: 7.5 mg via ORAL
  Filled 2019-01-24 (×2): qty 1

## 2019-01-24 NOTE — BHH Group Notes (Signed)
LCSW Group Therapy Note  01/24/2019 2:05 PM  Type of Therapy/Topic:  Group Therapy:  Balance in Life  Participation Level:  Minimal  Description of Group:    This group will address the concept of balance and how it feels and looks when one is unbalanced. Patients will be encouraged to process areas in their lives that are out of balance and identify reasons for remaining unbalanced. Facilitators will guide patients in utilizing problem-solving interventions to address and correct the stressor making their life unbalanced. Understanding and applying boundaries will be explored and addressed for obtaining and maintaining a balanced life. Patients will be encouraged to explore ways to assertively make their unbalanced needs known to significant others in their lives, using other group members and facilitator for support and feedback.  Therapeutic Goals: 1. Patient will identify two or more emotions or situations they have that consume much of in their lives. 2. Patient will identify signs/triggers that life has become out of balance:  3. Patient will identify two ways to set boundaries in order to achieve balance in their lives:  4. Patient will demonstrate ability to communicate their needs through discussion and/or role plays  Summary of Patient Progress: Pt was present in group and was respectful to other group members. Pt reported that he does not needs friends because he used to think he had best friends but they wouldn't consider him their best friend and he feels like friendships are compound. Pt reported that to maintain balance you can learn to cope and deal with your triggers.     Therapeutic Modalities:   Cognitive Behavioral Therapy Solution-Focused Therapy Assertiveness Training  Evalina Field, MSW, LCSW Clinical Social Work 01/24/2019 2:05 PM

## 2019-01-24 NOTE — Plan of Care (Signed)
  Problem: Activity: Goal: Will verbalize the importance of balancing activity with adequate rest periods Outcome: Progressing  Patient appears less restless no psychosis noted.

## 2019-01-24 NOTE — Progress Notes (Signed)
Patient alert and oriented x 4, affect is flat but brightens upon approach , he was noted restless and argumentative at times with staff using profanities , he was advised to use appropriate words and he was receptive to staff, he denies SI/HI/AVH, complaint with medication interacting with pees appropriately, complaint with medication regimen, 15 minutes safety checks maintained will continue to monitor.

## 2019-01-24 NOTE — Progress Notes (Signed)
Recreation Therapy Notes  Date: 01/24/2019  Time: 9:30 am   Location: Craft room   Behavioral response: N/A   Intervention Topic: Leisure  Discussion/Intervention: Patient did not attend group.   Clinical Observations/Feedback:  Patient did not attend group.   Ailana Cuadrado LRT/CTRS         Averyana Pillars 01/24/2019 10:59 AM

## 2019-01-24 NOTE — BHH Suicide Risk Assessment (Signed)
[facility] Discharge Suicide Risk Assessment   Principal Problem: Psychosis [facility]) Discharge Diagnoses: Principal Problem:   Psychosis ([place]) Active Problems:   Polysubstance abuse ([place])   Total Time spent with patient: 30 minutes  Musculoskeletal: Strength & Muscle Tone: within normal limits Gait & Station: normal Patient leans: N/A  Psychiatric Specialty Exam: Review of Systems  Constitutional: Negative.   HENT: Negative.   Eyes: Negative.   Respiratory: Negative.   Cardiovascular: Negative.   Gastrointestinal: Negative.   Musculoskeletal: Negative.   Skin: Negative.   Neurological: Negative.   Psychiatric/Behavioral: Negative.     Blood pressure 121/85, pulse 78, temperature 98.7 F (37.1 C), temperature source Oral, resp. rate 16, height 5\' 7"  (1.702 m), weight 68.5 kg, SpO2 100 %.Body mass index is 23.65 kg/m.  General Appearance: Casual  Eye Contact::  Good  Speech:  Clear and [id number]  Volume:  Normal  Mood:  Euthymic  Affect:  Congruent  Thought Process:  Goal Directed  Orientation:  Full (Time, Place, and Person)  Thought Content:  WDL  Suicidal Thoughts:  No  Homicidal Thoughts:  No  Memory:  Immediate;   Fair Recent;   Fair Remote;   Fair  Judgement:  Fair  Insight:  Fair  Psychomotor Activity:  Normal  Concentration:  Fair  Recall:  [employer] [place]  Language: Fair  Akathisia:  No  Handed:  Right  AIMS (if indicated):     Assets:  Desire for Improvement Housing Physical Health Resilience  Sleep:  Number of Hours: 8.5  Cognition: WNL  ADL's:  Intact   Mental Status Per Nursing Assessment::   On Admission:  NA  Demographic Factors:  Male and Low socioeconomic status  Loss Factors: Financial problems/change in socioeconomic status  Historical Factors: NA  Risk Reduction Factors:   Sense of responsibility to family, Religious beliefs about death and Positive social support  Continued Clinical Symptoms:  Schizophrenia:    Paranoid or undifferentiated type  Cognitive Features That Contribute To Risk:  None    Suicide Risk:  Minimal: No identifiable suicidal ideation.  Patients presenting with no risk factors but with morbid ruminations; may be classified as minimal risk based on the severity of the depressive symptoms  Follow-up Information    [place] Follow up on 01/30/2019.   Why: You have an appointment scheduled with Lanae Boast via zoom on 01/30/2019 at [place]. Thank You! Contact information: [place] [address] [phone]           Plan Of Care/Follow-up recommendations:  Activity:  Activity as tolerated Diet:  Regular diet Other:  Follow-up outpatient treatment with [facility]  Alethia Berthold, MD 01/24/2019, 4:48 PM

## 2019-01-24 NOTE — Progress Notes (Signed)
[facility]  01/24/2019 12:57 PM Bradley Casey  MRN:  [id number] Subjective: Follow-up for this 28 year old man who presented with a form delusional set and anxiety.  Patient seen today chart reviewed.  Patient was very pleasant and appropriately interactive.  He felt that he was less tired today after taking only the Zyprexa but still feels like it is a little sedating for him.  Mentally he says he is feeling better.  He is less anxious and less worried about the people he thought were offering him trips to outer space.  To be clear I think he still believes in it and it may very well be that we are not going to change his mind about that but he completely denies any suicidal or homicidal thoughts and feels much more comfortable about following up with his normal life after he leaves the hospital.  He has been interacting with peers and staff all very appropriately. Principal Problem: Psychosis (HCC) Diagnosis: Principal Problem:   Psychosis (HCC) Active Problems:   Polysubstance abuse (HCC)  Total Time spent with patient: 30 minutes  Past Psychiatric History: Patient has no specific known past psychiatric history  Past Medical History: History reviewed. No pertinent past medical history. History reviewed. No pertinent surgical history. Family History: History reviewed. No pertinent family history. Family Psychiatric  History: See previous Social History:  Social History   Substance and Sexual Activity  Alcohol Use Yes     Social History   Substance and Sexual Activity  Drug Use Yes  . Types: Marijuana, Cocaine    Social History   Socioeconomic History  . Marital status: Single    Spouse name: Not on file  . Number of children: Not on file  . Years of education: Not on file  . Highest education level: Not on file  Occupational History  . Not on file  Social Needs  . Financial resource strain: Not on file  . Food insecurity    Worry: Not on file    Inability: Not  on file  . Transportation needs    Medical: Not on file    Non-medical: Not on file  Tobacco Use  . Smoking status: Current Some Day Smoker  . Smokeless tobacco: Never Used  Substance and Sexual Activity  . Alcohol use: Yes  . Drug use: Yes    Types: Marijuana, Cocaine  . Sexual activity: Not on file  Lifestyle  . Physical activity    Days per week: Not on file    Minutes per session: Not on file  . Stress: Not on file  Relationships  . Social Musicianconnections    Talks on phone: Not on file    Gets together: Not on file    Attends religious service: Not on file    Active member of club or organization: Not on file    Attends meetings of clubs or organizations: Not on file    Relationship status: Not on file  Other Topics Concern  . Not on file  Social History Narrative  . Not on file   Additional Social History:                         Sleep: Good  Appetite:  Good  Current Medications: Current Facility-Administered Medications  Medication Dose Route Frequency Provider Last Rate Last Dose  . acetaminophen (TYLENOL) tablet 650 mg  650 mg Oral Q6H PRN Cristofano, Worthy RancherPaul A, MD      .  alum & mag hydroxide-simeth (MAALOX/MYLANTA) 200-200-20 MG/5ML suspension 30 mL  30 mL Oral Q4H PRN Cristofano, Paul A, MD      . influenza vac split quadrivalent PF (FLUARIX) injection 0.5 mL  0.5 mL Intramuscular Tomorrow-1000 Clapacs, John T, MD      . LORazepam (ATIVAN) tablet 2 mg  2 mg Oral Q6H PRN Cristofano, Paul A, MD      . magnesium hydroxide (MILK OF MAGNESIA) suspension 30 mL  30 mL Oral Daily PRN Cristofano, Paul A, MD      . OLANZapine (ZYPREXA) tablet 7.5 mg  7.5 mg Oral QHS Clapacs, John T, MD        Lab Results: No results found for this or any previous visit (from the past 48 hour(s)).  Blood Alcohol level:  Lab Results  Component Value Date   ETH 13 (H) 46/96/2952    Metabolic Disorder Labs: No results found for: HGBA1C, MPG No results found for: PROLACTIN Lab  Results  Component Value Date   CHOL 160 01/22/2019   TRIG 80 01/22/2019   HDL 61 01/22/2019   CHOLHDL 2.6 01/22/2019   VLDL 16 01/22/2019   LDLCALC 83 01/22/2019    Physical Findings: AIMS: Facial and Oral Movements Muscles of Facial Expression: None, normal Lips and Perioral Area: None, normal Jaw: None, normal Tongue: None, normal,Extremity Movements Upper (arms, wrists, hands, fingers): None, normal Lower (legs, knees, ankles, toes): None, normal, Trunk Movements Neck, shoulders, hips: None, normal, Overall Severity Severity of abnormal movements (highest score from questions above): None, normal Incapacitation due to abnormal movements: None, normal Patient's awareness of abnormal movements (rate only patient's report): No Awareness, Dental Status Current problems with teeth and/or dentures?: No Does patient usually wear dentures?: No  CIWA:    COWS:     Musculoskeletal: Strength & Muscle Tone: within normal limits Gait & Station: normal Patient leans: N/A  Psychiatric Specialty Exam: Physical Exam  Nursing note and vitals reviewed. Constitutional: He appears well-developed and well-nourished.  HENT:  Head: Normocephalic and atraumatic.  Eyes: Pupils are equal, round, and reactive to light. Conjunctivae are normal.  Neck: Normal range of motion.  Cardiovascular: Regular rhythm and normal heart sounds.  Respiratory: Effort normal.  GI: Soft.  Musculoskeletal: Normal range of motion.  Neurological: He is alert.  Skin: Skin is warm and dry.  Psychiatric: He has a normal mood and affect. His behavior is normal. Judgment and thought content normal.    Review of Systems  Constitutional: Negative.   HENT: Negative.   Eyes: Negative.   Respiratory: Negative.   Cardiovascular: Negative.   Gastrointestinal: Negative.   Musculoskeletal: Negative.   Skin: Negative.   Neurological: Negative.   Psychiatric/Behavioral: Negative for depression, hallucinations, memory  loss, substance abuse and suicidal ideas. The patient is nervous/anxious. The patient does not have insomnia.     Blood pressure 121/85, pulse 78, temperature 98.7 F (37.1 C), temperature source Oral, resp. rate 16, height 5\' 7"  (1.702 m), weight 68.5 kg, SpO2 100 %.Body mass index is 23.65 kg/m.  General Appearance: Casual  Eye Contact:  Good  Speech:  Clear and Coherent  Volume:  Normal  Mood:  Euthymic  Affect:  Appropriate  Thought Process:  Coherent  Orientation:  Full (Time, Place, and Person)  Thought Content:  Logical  Suicidal Thoughts:  No  Homicidal Thoughts:  No  Memory:  Immediate;   Fair Recent;   Fair Remote;   Fair  Judgement:  Fair  Insight:  Fair  Psychomotor Activity:  Normal  Concentration:  Concentration: Fair  Recall:  [employer] of Knowledge:  Fair  Language:  Fair  Akathisia:  Negative  Handed:  Right  AIMS (if indicated):     Assets:  Housing Physical Health Social Support  ADL's:  Intact  Cognition:  WNL  Sleep:  Number of Hours: 8.5     Treatment Plan Summary: Daily contact with patient to assess and evaluate symptoms and progress in treatment, Medication management and Plan I agreed with him that we will try gently cutting down the dose of the Zyprexa to 7.5.  He actually was reassured and did not think we needed to go down as far as 5 mg.  He still thinks the medicine is helping him to think more clearly.  We will go with 7.5 mg pills.  I will order a 10-day supply prior to discharge and we are going to plan for discharge tomorrow morning.  Mordecai Rasmussen, MD 01/24/2019, 12:57 PM

## 2019-01-24 NOTE — Plan of Care (Signed)
Continue tor verbalize understanding of information receive. Emotional and mental status improved  Attending unit programing . Voice no concerns around  Sleep or wake cycles. No anger outburst  Or inappropriate venting of frustrations,  Able to maintain self control  No safety concerns . Thought process remained Altered Able to verbalize understanding of medications . Working on coping , decision making and anxiety.   Problem: Education: Goal: Knowledge of [facility] Education information/materials will improve Outcome: Progressing Goal: Emotional status will improve Outcome: Progressing Goal: Mental status will improve Outcome: Progressing Goal: Verbalization of understanding the information provided will improve Outcome: Progressing   Problem: Activity: Goal: Interest or engagement in activities will improve Outcome: Progressing Goal: Sleeping patterns will improve Outcome: Progressing   Problem: Health Behavior/Discharge Planning: Goal: Identification of resources available to assist in meeting health care needs will improve Outcome: Progressing Goal: Compliance with treatment plan for underlying cause of condition will improve Outcome: Progressing   Problem: Physical Regulation: Goal: Ability to maintain clinical measurements within normal limits will improve Outcome: Progressing   Problem: Safety: Goal: Periods of time without injury will increase Outcome: Progressing   Problem: Activity: Goal: Will verbalize the importance of balancing activity with adequate rest periods Outcome: Progressing   Problem: Education: Goal: Will be free of psychotic symptoms Outcome: Progressing Goal: Knowledge of the prescribed therapeutic regimen will improve Outcome: Progressing   Problem: Coping: Goal: Coping ability will improve Outcome: Progressing Goal: Will verbalize feelings Outcome: Progressing   Problem: Health Behavior/Discharge Planning: Goal: Compliance with  prescribed medication regimen will improve Outcome: Progressing   Problem: Nutritional: Goal: Ability to achieve adequate nutritional intake will improve Outcome: Progressing   Problem: Role Relationship: Goal: Ability to communicate needs accurately will improve Outcome: Progressing Goal: Ability to interact with others will improve Outcome: Progressing   Problem: Safety: Goal: Ability to redirect hostility and anger into socially appropriate behaviors will improve Outcome: Progressing Goal: Ability to remain free from injury will improve Outcome: Progressing   Problem: Self-Care: Goal: Ability to participate in self-care as condition permits will improve Outcome: Progressing   Problem: Self-Concept: Goal: Will verbalize positive feelings about self Outcome: Progressing

## 2019-01-24 NOTE — Progress Notes (Signed)
D: Patient stated slept good last night .Stated appetite good and energy level normal. Stated concentration  good . Stated on Depression scale 0 , hopeless 0 and anxiety 0 .( low 0-10 high) Denies suicidal  homicidal ideations  .  No auditory hallucinations  No pain concerns . Appropriate ADL'S. Interacting with peers and staff.  Affect cheerful on approach this shift . Patient noted to  remained  In room  During morning  Part of shift . Patient  Aware of possible discharge tomorrow.  Voice of being ready to go home . Stated he still feels he could get pick up by aliens and taken to another planet.   A: Encourage patient participation with unit programming . Instruction  Given on  Medication , verbalize understanding.  R: Voice no other concerns. Staff continue to monitor

## 2019-01-25 MED ORDER — OLANZAPINE 7.5 MG PO TABS: 7.5000 mg | ORAL_TABLET | Freq: Every day | ORAL | 1 refills | Status: DC

## 2019-01-25 NOTE — Plan of Care (Signed)
  Problem: Activity: Goal: Will verbalize the importance of balancing activity with adequate rest periods Outcome: Progressing  Patient verbalized importance of balance with activity and rest periods.

## 2019-01-25 NOTE — Discharge Summary (Signed)
Physician Discharge Summary Note  Patient:  Bradley Casey is an 28 y.o., male MRN:  [id number] DOB:  1990-03-01 Patient phone:  [phone] (home)  Patient address:   [place] [address],  Total Time spent with patient: 30 minutes  Date of Admission:  01/21/2019 Date of Discharge: 01/25/19  Reason for Admission:  28 year old man who came to the emergency room reporting that he felt like people were working black magic on him and were out to get him.  Patient tells a long detailed story about how this past Friday night he ran into a woman he had never met before at a friend's house.  The woman started talking about bizarre things like having chips implanted under her skin and space and other planets and magic.  Patient seems like he was initially interested but gradually felt more distressed and upset.  Saturday he went to watch the [place] and afterwards ran into this woman again from what I understand.  He felt like she was offering him vast sums of Akia Desroches and rewards if he would "give up my soul".   Principal Problem: Psychosis [facility]) Discharge Diagnoses: Principal Problem:   Psychosis ([place]) Active Problems:   Polysubstance abuse [facility])   Past Psychiatric History: Patient insists he has had no previous psychiatric treatment whatsoever.  Never been on any medication.  Never been seen by a therapist or psychiatrist in the past.  No history of suicide attempts.  Denies violence.  Past Medical History: History reviewed. No pertinent past medical history. History reviewed. No pertinent surgical history. Family History: History reviewed. No pertinent family history. Family Psychiatric  History: None known Social History:  Social History   Substance and Sexual Activity  Alcohol Use Yes     Social History   Substance and Sexual Activity  Drug Use Yes  . Types: Marijuana, Cocaine    Social History   Socioeconomic History  . Marital status: Single    Spouse  name: Not on file  . Number of children: Not on file  . Years of education: Not on file  . Highest education level: Not on file  Occupational History  . Not on file  Social Needs  . Financial resource strain: Not on file  . Food insecurity    Worry: Not on file    Inability: Not on file  . Transportation needs    Medical: Not on file    Non-medical: Not on file  Tobacco Use  . Smoking status: Current Some Day Smoker  . Smokeless tobacco: Never Used  Substance and Sexual Activity  . Alcohol use: Yes  . Drug use: Yes    Types: Marijuana, Cocaine  . Sexual activity: Not on file  Lifestyle  . Physical activity    Days per week: Not on file    Minutes per session: Not on file  . Stress: Not on file  Relationships  . Social Herbalist on phone: Not on file    Gets together: Not on file    Attends religious service: Not on file    Active member of club or organization: Not on file    Attends meetings of clubs or organizations: Not on file    Relationship status: Not on file  Other Topics Concern  . Not on file  Social History Narrative  . Not on file    Hospital Course:  Patient remained on the [facility] unit for 3 days. The patient stabilized on medication  and therapy. Patient was discharged on Zyprexa 7.5 mg QHS. Patient has shown improvement with improved mood, affect, sleep, appetite, and interaction. Patient has attended group and participated. Patient has been seen in the day room interacting with peers and staff appropriately. Patient denies any SI/HI/AVH and contracts for safety. Patient agrees to follow up at [facility] services. Patient is provided with prescriptions for their medications upon discharge.  Physical Findings: AIMS: Facial and Oral Movements Muscles of Facial Expression: None, normal Lips and Perioral Area: None, normal Jaw: None, normal Tongue: None, normal,Extremity Movements Upper (arms, wrists, hands, fingers): None, normal Lower (legs, knees,  ankles, toes): None, normal, Trunk Movements Neck, shoulders, hips: None, normal, Overall Severity Severity of abnormal movements (highest score from questions above): None, normal Incapacitation due to abnormal movements: None, normal Patient's awareness of abnormal movements (rate only patient's report): No Awareness, Dental Status Current problems with teeth and/or dentures?: No Does patient usually wear dentures?: No  CIWA:    COWS:     Musculoskeletal: Strength & Muscle Tone: within normal limits Gait & Station: normal Patient leans: N/A  Psychiatric Specialty Exam: Physical Exam  Nursing note and vitals reviewed. Constitutional: He is oriented to person, place, and time. He appears well-developed and well-nourished.  Cardiovascular: Normal rate.  Respiratory: Effort normal.  Musculoskeletal: Normal range of motion.  Neurological: He is alert and oriented to person, place, and time.  Skin: Skin is warm.    Review of Systems  Constitutional: Negative.   HENT: Negative.   Eyes: Negative.   Respiratory: Negative.   Cardiovascular: Negative.   Gastrointestinal: Negative.   Genitourinary: Negative.   Musculoskeletal: Negative.   Skin: Negative.   Neurological: Negative.   Endo/Heme/Allergies: Negative.   Psychiatric/Behavioral: Negative.     Blood pressure 124/76, pulse 68, temperature 98.2 F (36.8 C), temperature source Oral, resp. rate 16, height _0  (1.702 m), weight 68.5 kg, SpO2 100 %.Body mass index is 23.65 kg/m.   General Appearance: Casual  Eye Contact::  Good  Speech:  Clear and [id number]  Volume:  Normal  Mood:  Euthymic  Affect:  Congruent  Thought Process:  Goal Directed  Orientation:  Full (Time, Place, and Person)  Thought Content:  WDL  Suicidal Thoughts:  No  Homicidal Thoughts:  No  Memory:  Immediate;   Fair Recent;   Fair Remote;   Fair  Judgement:  Fair  Insight:  Fair  Psychomotor Activity:  Normal  Concentration:  Fair  Recall:   [employer] [place]  Language: Fair  Akathisia:  No  Handed:  Right  AIMS (if indicated):     Assets:  Desire for Improvement Housing Physical Health Resilience  Sleep:  Number of Hours: 8.5  Cognition: WNL  ADL's:  Intact   Have you used any form of tobacco in the last 30 days? (Cigarettes, Smokeless Tobacco, Cigars, and/or Pipes): Yes  Has this patient used any form of tobacco in the last 30 days? (Cigarettes, Smokeless Tobacco, Cigars, and/or Pipes) Yes, Yes, A prescription for an FDA-approved tobacco cessation medication was offered at discharge and the patient refused  Blood Alcohol level:  Lab Results  Component Value Date   ETH 13 (H) 81/02/7508    Metabolic Disorder Labs:  No results found for: HGBA1C, MPG No results found for: PROLACTIN Lab Results  Component Value Date   CHOL 160 01/22/2019   TRIG 80 01/22/2019   HDL 61 01/22/2019   CHOLHDL 2.6 01/22/2019   VLDL 16 01/22/2019  [place] 83 01/22/2019    See Psychiatric Specialty Exam and Suicide Risk Assessment completed by Attending Physician prior to discharge.  Discharge destination:  Home  Is patient on multiple antipsychotic therapies at discharge:  No   Has Patient had three or more failed trials of antipsychotic monotherapy by history:  No  Recommended Plan for Multiple Antipsychotic Therapies: NA  Discharge Instructions    Diet - low sodium heart healthy   Complete by: As directed    Increase activity slowly   Complete by: As directed      Allergies as of 01/25/2019   No Known Allergies     Medication List    STOP taking these medications   cefUROXime 500 MG tablet Commonly known as: Ceftin   mupirocin ointment 2 % Commonly known as: BACTROBAN   ondansetron 4 MG tablet Commonly known as: Zofran     TAKE these medications     Indication  OLANZapine 7.5 MG tablet Commonly known as: ZYPREXA Take 1 tablet (7.5 mg total) by mouth at bedtime.  Indication: Schizophrenia       Follow-up Information    [facility] Follow up on 01/30/2019.   Why: You have an appointment scheduled with Lanae Boast via zoom on 01/30/2019 at [facility]. Thank You! Contact information: [place] [address] [phone]           Follow-up recommendations:  Continue activity as tolerated. Continue diet as recommended by your PCP. Ensure to keep all appointments with outpatient providers.  Comments:  Patient is instructed prior to discharge to: Take all medications as prescribed by his/her mental healthcare provider. Report any adverse effects and or reactions from the medicines to his/her outpatient provider promptly. Patient has been instructed & cautioned: To not engage in alcohol and or illegal drug use while on prescription medicines. In the event of worsening symptoms, patient is instructed to call the crisis hotline, 911 and or go to the nearest ED for appropriate evaluation and treatment of symptoms. To follow-up with his/her primary care provider for your other medical issues, concerns and or health care needs.    Signed: [place], FNP 01/25/2019, 9:19 AM

## 2019-01-25 NOTE — Progress Notes (Signed)
D:Patient denies SI/HI/AVH, able to contract for safety at this time. Pt appears calm and cooperative, and no distress noted.   A: All Personal items in locker returned to pt. Upon discharge.   R:  Pt States he will comply with discharge planning put into place and take MEDS as prescribed. Pt escorted out of the building by this writer/staff. Pt. Made sure he received supply medicine for discharge.     

## 2019-01-25 NOTE — Plan of Care (Signed)
  Problem: Group Participation Goal: STG - Patient will engage in groups without prompting or encouragement from LRT x3 group sessions within 5 recreation therapy group sessions Description: STG - Patient will engage in groups without prompting or encouragement from LRT x3 group sessions within 5 recreation therapy group sessions 01/25/2019 1213 by Ernest Haber, LRT Outcome: Not Applicable 44/07/9505 [id number] by Ernest Haber, LRT Outcome: Not Met (add Reason)

## 2019-01-25 NOTE — Plan of Care (Signed)
Pt. Deemed adequate for discharge by attending. Pt. Care planning closed out at this time to prepare for discharge.     D: Pt during assessments this morning denies SI/HI/AVH, able to verbally contract for safety. Pt is pleasant and cooperative. Pt. has no Complaints.  Patient interaction is appropriate. No behavioral concerns to report.   A: Q x 15 minute observation checks in place for safety, until pending discharge complete. Patient was and is provided with education throughout shift this morning thus far and given pending discharge education as information is processed. Patient was and will be given/offered medications per orders. Patient was and is encouraged to attend groups, participate in unit activities and continue with plan of care, while waiting pending discharge. Pt. Chart and plans of care reviewed. Pt. Given support and encouragement.   R: Patient is complaint with medication and unit procedures. Pt. Observed eating good. No behavioral concerns. Will continue to process discharge.      Problem: Education: Goal: Knowledge of [facility] Education information/materials will improve Outcome: Completed/Met Goal: Emotional status will improve Outcome: Completed/Met Goal: Mental status will improve Outcome: Completed/Met Goal: Verbalization of understanding the information provided will improve Outcome: Completed/Met   Problem: Activity: Goal: Interest or engagement in activities will improve Outcome: Completed/Met Goal: Sleeping patterns will improve Outcome: Completed/Met   Problem: Coping: Goal: Ability to verbalize frustrations and anger appropriately will improve Outcome: Completed/Met Goal: Ability to demonstrate self-control will improve Outcome: Completed/Met   Problem: Health Behavior/Discharge Planning: Goal: Identification of resources available to assist in meeting health care needs will improve Outcome: Completed/Met Goal: Compliance with treatment  plan for underlying cause of condition will improve Outcome: Completed/Met   Problem: Physical Regulation: Goal: Ability to maintain clinical measurements within normal limits will improve Outcome: Completed/Met   Problem: Safety: Goal: Periods of time without injury will increase Outcome: Completed/Met   Problem: Activity: Goal: Will verbalize the importance of balancing activity with adequate rest periods Outcome: Completed/Met   Problem: Education: Goal: Will be free of psychotic symptoms Outcome: Completed/Met Goal: Knowledge of the prescribed therapeutic regimen will improve Outcome: Completed/Met   Problem: Coping: Goal: Coping ability will improve Outcome: Completed/Met Goal: Will verbalize feelings Outcome: Completed/Met   Problem: Health Behavior/Discharge Planning: Goal: Compliance with prescribed medication regimen will improve Outcome: Completed/Met   Problem: Nutritional: Goal: Ability to achieve adequate nutritional intake will improve Outcome: Completed/Met   Problem: Role Relationship: Goal: Ability to communicate needs accurately will improve Outcome: Completed/Met Goal: Ability to interact with others will improve Outcome: Completed/Met   Problem: Safety: Goal: Ability to redirect hostility and anger into socially appropriate behaviors will improve Outcome: Completed/Met Goal: Ability to remain free from injury will improve Outcome: Completed/Met   Problem: Self-Care: Goal: Ability to participate in self-care as condition permits will improve Outcome: Completed/Met   Problem: Self-Concept: Goal: Will verbalize positive feelings about self Outcome: Completed/Met

## 2019-01-25 NOTE — Progress Notes (Signed)
Patient alert and oriented x 4, affect is bright upon  approach , he was calm and receptive to staff, no loud outburst , he denies SI/HI/AVH, complaint with medication regimen, attended evening wrap up group, interacting with peers appropriately,  15 minutes safety checks maintained will continue to monitor closely.

## 2019-01-25 NOTE — Progress Notes (Signed)
Recreation Therapy Notes  INPATIENT RECREATION TR PLAN  Patient Details Name: Feras Gardella MRN: [id number] DOB: 1990/08/07 Today's Date: 01/25/2019  Rec Therapy Plan Is patient appropriate for Therapeutic Recreation?: Yes Treatment times per week: at least 3 Estimated Length of Stay: 5-7 days TR Treatment/Interventions: Group participation (Comment)  Discharge Criteria Pt will be discharged from therapy if:: Discharged Treatment plan/goals/alternatives discussed and agreed upon by:: Patient/family  Discharge Summary Short term goals set: Patient will engage in groups without prompting or encouragement from LRT x3 group sessions within 5 recreation therapy group sessions Short term goals met: Not met Progress toward goals comments: Groups attended Which groups?: Other (Comment)(Strengths) Reason goals not met: Patient did not attend any groups Therapeutic equipment acquired: N/A Reason patient discharged from therapy: Discharge from hospital Pt/family agrees with progress & goals achieved: Yes Date patient discharged from therapy: 01/25/19   Damontre Millea 01/25/2019, 12:14 PM

## 2019-01-25 NOTE — Progress Notes (Signed)
  [facility] Adult Case Management Discharge Plan :  Will you be returning to the same living situation after discharge:  Yes,  uncles home At discharge, do you have transportation home?: Yes,  mom will pick pt up Do you have the ability to pay for your medications: Yes,  mental health  Release of information consent forms completed and in the chart;    Patient to Follow up at: Follow-up Information    [facility] Follow up on 01/30/2019.   Why: You have an appointment scheduled with Lanae Boast via zoom on 01/30/2019 at [facility]. Thank You! Contact information: [place] [id number] [phone]           Next level of care provider has access to [place] and Suicide Prevention discussed: Yes,  SPE completed with pt as pt declined collateral contact  Have you used any form of tobacco in the last 30 days? (Cigarettes, Smokeless Tobacco, Cigars, and/or Pipes): Yes  Has patient been referred to the Quitline?: Patient refused referral  Patient has been referred for addiction treatment: Pt. refused referral  Delfin Edis, LCSW 01/25/2019, 8:59 AM

## 2019-02-13 ENCOUNTER — Ambulatory Visit: Payer: Self-pay

## 2019-05-15 ENCOUNTER — Telehealth: Payer: Self-pay | Admitting: Pharmacy Technician

## 2019-05-15 NOTE — Telephone Encounter (Signed)
Patient failed to provide 2021 proof of income.  No additional medication assistance will be provided by [facility] without the required proof of income documentation.  Patient notified by letter.  Bradley Casey Care Manager [facility]   [address] , [place]  [address]     This is to inform you that you are no longer eligible to receive medication assistance at [facility].  The reason(s) are:    _____Your total gross monthly household income exceeds 250% of the Federal Poverty Level.   _____Tangible assets (savings, checking, stocks/bonds, pension, retirement, etc.) exceeds our limit  _____You are eligible to receive benefits from Medicaid, [facility] or HIV Medication              Assistance Program _____You are eligible to receive benefits from a Medicare Part "D" plan _____You have prescription insurance  _____You are not an [place] resident __X__Failure to provide all requested proof of income information for 2021.    Medication assistance will resume once all requested financial information has been returned to our clinic.  If you have questions, please contact our clinic at [phone].    Thank you,  [facility] 

## 2020-08-06 ENCOUNTER — Emergency Department
Admission: EM | Admit: 2020-08-06 | Discharge: 2020-08-07 | Disposition: A | Discharge status: Home / Self Care | Attending: Emergency Medicine | Admitting: Emergency Medicine

## 2020-08-06 ENCOUNTER — Other Ambulatory Visit: Payer: Self-pay

## 2020-08-06 DIAGNOSIS — F14150 Cocaine abuse with cocaine-induced psychotic disorder with delusions: Secondary | ICD-10-CM | POA: Insufficient documentation

## 2020-08-06 DIAGNOSIS — F1210 Cannabis abuse, uncomplicated: Secondary | ICD-10-CM | POA: Insufficient documentation

## 2020-08-06 DIAGNOSIS — F1910 Other psychoactive substance abuse, uncomplicated: Secondary | ICD-10-CM | POA: Insufficient documentation

## 2020-08-06 DIAGNOSIS — F17200 Nicotine dependence, unspecified, uncomplicated: Secondary | ICD-10-CM | POA: Insufficient documentation

## 2020-08-06 DIAGNOSIS — F29 Unspecified psychosis not due to a substance or known physiological condition: Secondary | ICD-10-CM | POA: Diagnosis present

## 2020-08-06 DIAGNOSIS — Z20822 Contact with and (suspected) exposure to covid-19: Secondary | ICD-10-CM | POA: Insufficient documentation

## 2020-08-06 DIAGNOSIS — F14951 Cocaine use, unspecified with cocaine-induced psychotic disorder with hallucinations: Secondary | ICD-10-CM | POA: Diagnosis not present

## 2020-08-06 DIAGNOSIS — F22 Delusional disorders: Secondary | ICD-10-CM | POA: Diagnosis present

## 2020-08-06 LAB — COMPREHENSIVE METABOLIC PANEL
ALT: 15 U/L (ref 0–44)
AST: 32 U/L (ref 15–41)
Albumin: 4.4 g/dL (ref 3.5–5.0)
Alkaline Phosphatase: 69 U/L (ref 38–126)
Anion gap: 14 (ref 5–15)
BUN: 11 mg/dL (ref 6–20)
CO2: 25 mmol/L (ref 22–32)
Calcium: 9.5 mg/dL (ref 8.9–10.3)
Chloride: 98 mmol/L (ref 98–111)
Creatinine, Ser: 2 mg/dL — ABNORMAL HIGH (ref 0.61–1.24)
GFR, Estimated: 46 mL/min — ABNORMAL LOW (ref >60)
Glucose, Bld: 266 mg/dL — ABNORMAL HIGH (ref 70–99)
Potassium: 3.5 mmol/L (ref 3.5–5.1)
Sodium: 137 mmol/L (ref 135–145)
Total Bilirubin: 0.8 mg/dL (ref 0.3–1.2)
Total Protein: 7.2 g/dL (ref 6.5–8.1)

## 2020-08-06 LAB — URINE DRUG SCREEN, QUALITATIVE (ARMC ONLY)
Amphetamines, Ur Screen: NOT DETECTED
Barbiturates, Ur Screen: NOT DETECTED
Benzodiazepine, Ur Scrn: NOT DETECTED
Cannabinoid 50 Ng, Ur ~~LOC~~: POSITIVE — AB
Cocaine Metabolite,Ur ~~LOC~~: POSITIVE — AB
MDMA (Ecstasy)Ur Screen: NOT DETECTED
Methadone Scn, Ur: NOT DETECTED
Opiate, Ur Screen: NOT DETECTED
Phencyclidine (PCP) Ur S: NOT DETECTED
Tricyclic, Ur Screen: NOT DETECTED

## 2020-08-06 LAB — CBC
HCT: 57.3 % — ABNORMAL HIGH (ref 39.0–52.0)
Hemoglobin: 19.1 g/dL — ABNORMAL HIGH (ref 13.0–17.0)
MCH: 28.6 pg (ref 26.0–34.0)
MCHC: 33.3 g/dL (ref 30.0–36.0)
MCV: 85.8 fL (ref 80.0–100.0)
Platelets: 308 10*3/uL (ref 150–400)
RBC: 6.68 MIL/uL — ABNORMAL HIGH (ref 4.22–5.81)
RDW: 13.1 % (ref 11.5–15.5)
WBC: 7.8 10*3/uL (ref 4.0–10.5)
nRBC: 0 % (ref 0.0–0.2)

## 2020-08-06 LAB — ETHANOL: Alcohol, Ethyl (B): <10 mg/dL (ref <10)

## 2020-08-06 MED ORDER — LORAZEPAM 1 MG PO TABS
1.0000 mg | ORAL_TABLET | Freq: Once | ORAL | Status: AC
  Administered 2020-08-06: 1 mg via ORAL
  Filled 2020-08-06: qty 1

## 2020-08-06 MED ORDER — OLANZAPINE 5 MG PO TABS
7.5000 mg | ORAL_TABLET | Freq: Every day | ORAL | Status: DC
  Administered 2020-08-06: 7.5 mg via ORAL
  Filled 2020-08-06: qty 2

## 2020-08-06 MED ORDER — SODIUM CHLORIDE 0.9 % IV BOLUS (SEPSIS)
1000.0000 mL | Freq: Once | INTRAVENOUS | Status: AC
  Administered 2020-08-06: 1000 mL via INTRAVENOUS

## 2020-08-06 NOTE — ED Notes (Signed)
Hourly rounding performed, patient currently awake in hallway bed. Patient has no complaints at this time. Q15 minute rounds and monitoring via Rover and Officer to continue. 

## 2020-08-06 NOTE — ED Notes (Signed)
Patient transferred from Triage to room after dressing out and screening for contraband. Report received from [place], [place] including situation, background, assessment and recommendations. Pt oriented to [employer] including Q15 minute rounds as well as Psychologist, counselling for their protection. Patient is alert and oriented, warm and dry in no acute distress. Patient denies SI and AVH. Pt. Encouraged to let this nurse know if needs arise.

## 2020-08-06 NOTE — ED Notes (Signed)
Difficult to gather information from pt, pt not forthcoming with information but seems to be more due to pt not sure what he is thinking or seeing. States that he has no medications he takes regularly, states his wife went into the kitchen and he was not sure if he saw someone with her or what but there were shoes. Pt sweaty at this time

## 2020-08-06 NOTE — BH Assessment (Signed)
Comprehensive Clinical Assessment (CCA) Note  08/06/2020 Bradley Casey [id number]  Chief Complaint: Patient is a 30 year old male presenting to [facility] ED voluntarily seeking assistance for his mental health. Per triage note Pt presents via POV requesting psych eval. Reports marijuana and cocaine abuse. Denies SI. Reports "seen shoe while with baby mama and locked self in bathroom". Calm and cooperative at this time. During assessment patient appears alert and oriented x4, somewhat anxious but cooperative. Patient reports that he used cocaine today and reports that his symptoms "it' doesn't make it necessarily get worse, it's other stuff going on the past 2-3 days." Patient UDS is positive for cocaine and cannabinoids. At first patient was not forthcoming with information regarding why he is presenting to the ED but patient reports "people be doing certain things, I'm at home with my girlfriend and I had this weird feeling that she had been saying weird stuff, she'll go into another room and talk." Patient reports that he has been prescribed mental health medications in the past but reports that he stopped taking it "I didn't think that I had an issue then, now I feel like I do." Per chart review patient was admitted to [facility] in 2020 for Psychosis. Patient reports VH "It looked like someone was standing at the door but I only saw shoes." Patient also reports "I feel like somebody is out to hurt me." Patient denies SI/HI/AH.  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 Self  Referral name: No data recorded Referral phone number: No data recorded  Whom do you see for routine medical problems? No data  recorded Practice/Facility Name: No data recorded Practice/Facility Phone Number: No data recorded Name of Contact: No data recorded Contact Number: No data recorded Contact Fax Number: No data recorded Prescriber Name: No data recorded Prescriber Address (if known): No data recorded  What Is the Reason for Your Visit/Call Today? No data recorded How Long Has This Been Causing You Problems? > than 6 months  What Do You Feel Would Help You the Most Today? Treatment for Depression or other mood problem   Have You Recently Been in Any Inpatient Treatment (Hospital/Detox/Crisis Center/28-Day Program)? No data recorded Name/Location of Program/Hospital:No data recorded How Long Were You There? No data recorded When Were You Discharged? No data recorded  Have You Ever Received Services From [facility] Before? No data recorded Who Do You See at [facility]? No data recorded  Have You Recently Had Any Thoughts About Hurting Yourself? No  Are You Planning to Commit Suicide/Harm Yourself At This time? No   Have you Recently Had Thoughts About Hurting Someone [name]? No  Explanation: No data recorded  Have You Used Any Alcohol or Drugs in the Past 24 Hours? Yes  How Long Ago Did You Use Drugs or Alcohol? No data recorded What Did You Use and How Much? Cocaine   Do You Currently Have a Therapist/Psychiatrist? No  Name of Therapist/Psychiatrist: No data recorded  Have You Been Recently Discharged From Any Office Practice or Programs? No  Explanation of Discharge From Practice/Program: No data recorded    CCA Screening Triage Referral Assessment Type of Contact: Face-to-Face  Is this Initial or Reassessment? No data recorded  Date Telepsych consult ordered in [facility]:  No data recorded Time Telepsych consult ordered in [facility]:  No data recorded  Patient Reported Information Reviewed? No data recorded Patient Left Without Being Seen? No data recorded Reason for Not Completing  Assessment: No data recorded  Collateral Involvement: No data recorded  Does Patient Have a Court Appointed Legal Guardian? No data recorded Name and Contact of Legal Guardian: No data recorded If Minor and Not Living with Parent(s), Who has Custody? No data recorded Is CPS involved or ever been involved? Never  Is APS involved or ever been involved? Never   Patient Determined To Be At Risk for Harm To Self or Others Based on Review of Patient Reported Information or Presenting Complaint? No  Method: No data recorded Availability of Means: No data recorded Intent: No data recorded Notification Required: No data recorded Additional Information for Danger to Others Potential: No data recorded Additional Comments for Danger to Others Potential: No data recorded Are There Guns or Other Weapons in Your Home? No data recorded Types of Guns/Weapons: No data recorded Are These Weapons Safely Secured?                            No data recorded Who Could Verify You Are Able To Have These Secured: No data recorded Do You Have any Outstanding Charges, Pending Court Dates, Parole/Probation? No data recorded Contacted To Inform of Risk of Harm To Self or Others: No data recorded  [facility]   Does Patient Present under Involuntary Commitment? No  IVC Papers Initial File Date: No data recorded  [place]   Patient Currently Receiving the Following Services: No data recorded  Determination of Need: Emergent (2 hours)   Options For Referral: No data recorded    CCA Biopsychosocial Intake/Chief Complaint:  No data recorded Current Symptoms/Problems: No data recorded  Patient Reported Schizophrenia/Schizoaffective Diagnosis in Past: No   Strengths: Patient is able to communicate and identify his needs  Preferences: No data recorded Abilities: No data recorded  Type of Services Patient Feels are Needed: No data recorded  Initial  Clinical Notes/Concerns: No data recorded  Mental Health Symptoms Depression:   Difficulty Concentrating   Duration of Depressive symptoms:  Greater than two weeks   Mania:   Racing thoughts   Anxiety:    Tension; Worrying; Restlessness   Psychosis:   Delusions; Hallucinations   Duration of Psychotic symptoms:  Greater than six months   Trauma:   None   Obsessions:   None   Compulsions:   None   Inattention:   None   Hyperactivity/Impulsivity:   None   Oppositional/Defiant Behaviors:   None   Emotional Irregularity:   None   Other Mood/Personality Symptoms:  No data recorded   Mental Status Exam Appearance and self-care  Stature:   Average   Weight:   Average weight   Clothing:   Casual   Grooming:   Normal   Cosmetic use:   None   Posture/gait:   Normal   Motor activity:   Not Remarkable   Sensorium  Attention:   Normal   Concentration:   Normal   Orientation:   X5   Recall/memory:   Normal   Affect and Mood  Affect:   Anxious; Flat   Mood:   Anxious   Relating  Eye contact:   Normal   Facial expression:   Responsive  Attitude toward examiner:   Cooperative   Thought and Language  Speech flow:  Clear and Coherent   Thought content:   Appropriate to Mood and Circumstances   Preoccupation:   None   Hallucinations:   Visual   Organization:  No data recorded  [employer] of Knowledge:   Fair   Intelligence:   Average   Abstraction:   Normal   Judgement:   Good   Reality Testing:   Realistic   Insight:   Fair   Decision Making:   Normal   Social Functioning  Social Maturity:   Responsible   Social Judgement:   Normal   Stress  Stressors:   Other (Comment)   Coping Ability:   Exhausted   Skill Deficits:   None   Supports:   Family     Religion: Religion/Spirituality Are You A Religious Person?: No  Leisure/Recreation: Leisure / Recreation Do You  Have Hobbies?: No  Exercise/Diet: Exercise/Diet Do You Exercise?: No Have You Gained or Lost A Significant Amount of Weight in the Past Six Months?: No Do You Follow a Special Diet?: No Do You Have Any Trouble Sleeping?: No   CCA Employment/Education Employment/Work Situation: Employment / Work Situation Employment Situation: Unemployed Patient's Job has Been Impacted by Current Illness: No Has Patient ever Been in Equities trader?: No  Education: Education Is Patient Currently Attending School?: No Did Theme park manager?: No Did You Have An Individualized Education Program (IIEP): No Did You Have Any Difficulty At [employer]?: No Patient's Education Has Been Impacted by Current Illness: No   CCA Family/Childhood History Family and Relationship History: Family history Marital status: Single Does patient have children?: Yes How many children?: 1 How is patient's relationship with their children?: Patient's relationship with child is good  Childhood History:  Childhood History By whom was/is the patient raised?: Mother Did patient suffer any verbal/emotional/physical/sexual abuse as a child?: No Did patient suffer from severe childhood neglect?: No Has patient ever been sexually abused/assaulted/raped as an adolescent or adult?: No Was the patient ever a victim of a crime or a disaster?: No Witnessed domestic violence?: No Has patient been affected by domestic violence as an adult?: No  Child/Adolescent Assessment:     CCA Substance Use Alcohol/Drug Use: Alcohol / Drug Use Pain Medications: See MAR Prescriptions: See MAR Over the Counter: See MAR History of alcohol / drug use?: Yes Substance #1 Name of Substance 1: Cocaine 1 - Amount (size/oz): Unable to quantify 1 - Frequency: daily 1 - Last Use / Amount: 08/06/20 1- Route of Use: Inhaling                       ASAM's:  Six Dimensions of Multidimensional Assessment  Dimension 1:  Acute Intoxication  and/or Withdrawal Potential:      Dimension 2:  Biomedical Conditions and Complications:      Dimension 3:  Emotional, Behavioral, or Cognitive Conditions and Complications:     Dimension 4:  Readiness to Change:     Dimension 5:  Relapse, Continued use, or Continued Problem Potential:     Dimension 6:  Recovery/Living Environment:     ASAM Severity Score:    ASAM Recommended Level of Treatment:     Substance use Disorder (SUD) Substance Use Disorder (SUD)  Checklist Symptoms of Substance Use: Continued use despite having a persistent/recurrent physical/psychological problem caused/exacerbated by use, Continued use despite persistent or recurrent social, interpersonal problems, caused or exacerbated by  use, Presence of craving or strong urge to use  Recommendations for Services/Supports/Treatments:  Reassess in the AM  DSM5 Diagnoses: Patient Active Problem List   Diagnosis Date Noted   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

## 2020-08-06 NOTE — ED Notes (Signed)
Pt sweaty, after reviewing vitals thsi nurse speaks to pt who reports cocaine use via snorting it. Spoke with Dr. Scotty Court, EKG being obtained by [facility] now and orders to follow.

## 2020-08-06 NOTE — ED Notes (Signed)
Black cell phone collected with belongings and placed into belongs bag. Denies other valuables.

## 2020-08-06 NOTE — ED Notes (Signed)
VOL  pending  consult 

## 2020-08-06 NOTE — ED Notes (Signed)
COVID swab sent to lab with temp label at this time

## 2020-08-06 NOTE — ED Triage Notes (Signed)
Pt presents via POV requesting psych eval. Reports marijuana and cocaine abuse. Denies SI. Reports "seen shoe while with baby mama and locked self in bathroom". Calm and cooperative at this time.

## 2020-08-06 NOTE — ED Provider Notes (Signed)
[facility] Emergency Department Provider Note  ____________________________________________  Time seen: Approximately 10:32 PM  I have reviewed the triage vital signs and the nursing notes.   HISTORY  Chief Complaint Psychiatric Evaluation    HPI Nainoa Woldt is a 30 y.o. male with a history of polysubstance abuse and paranoid delusions who comes to the ED complaining of increased paranoia over the past 3 days.  Last cocaine use was earlier today and marijuana use is ongoing.  States that after using cocaine most recently his paranoia has worsened.  Denies SI or HI, denies hallucinations.  Had previously been prescribed olanzapine but is not taking it.    History reviewed. No pertinent past medical history.   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 No pertinent surgical history.   Prior to Admission medications   Medication Sig Start Date End Date Taking? Authorizing Provider  OLANZapine (ZYPREXA) 7.5 MG tablet Take 1 tablet (7.5 mg total) by mouth at bedtime. Patient not taking: No sig reported 01/25/19   Money, Gerlene Burdock, FNP     Allergies Patient has no known allergies.   History reviewed. No pertinent family history.  Social History Social History   Tobacco Use   Smoking status: Some Days    Pack years: 0.00   Smokeless tobacco: Never  Substance Use Topics   Alcohol use: Yes   Drug use: Yes    Types: Marijuana, Cocaine    Review of Systems  Constitutional:   No fever or chills.  ENT:   No sore throat. No rhinorrhea. Cardiovascular:   No chest pain or syncope. Respiratory:   No dyspnea or cough. Gastrointestinal:   Negative for abdominal pain, vomiting and diarrhea.  Musculoskeletal:   Negative for focal pain or swelling All other  systems reviewed and are negative except as documented above in ROS and HPI.  ____________________________________________   PHYSICAL EXAM:  VITAL SIGNS: ED Triage Vitals  Enc Vitals Group     BP 08/06/20 2017 (!) 137/111     Pulse Rate 08/06/20 2017 (!) 147     Resp 08/06/20 2017 16     Temp 08/06/20 2017 99.8 F (37.7 C)     Temp Source 08/06/20 2017 Oral     SpO2 08/06/20 2017 97 %     Weight 08/06/20 2018 165 lb (74.8 kg)     Height 08/06/20 2018 5\' 11"  (1.803 m)     Head Circumference --      Peak Flow --      Pain Score 08/06/20 2018 0     Pain Loc --      Pain Edu? --      Excl. in GC? --     Vital signs reviewed, nursing assessments reviewed.   Constitutional:   Alert and oriented. Non-toxic appearance. Eyes:   Conjunctivae are normal. EOMI. PERRL. ENT      Head:   Normocephalic and atraumatic.      Nose:   Wearing a mask.      Mouth/Throat:   Wearing a mask.      Neck:   No meningismus. Full ROM. Hematological/Lymphatic/Immunilogical:   No cervical lymphadenopathy. Cardiovascular:   Tachycardia heart rate 120. Symmetric bilateral radial and DP pulses.  No murmurs. Cap refill less than 2 seconds. Respiratory:  Normal respiratory effort without tachypnea/retractions. Breath sounds are clear and equal bilaterally. No wheezes/rales/rhonchi. Gastrointestinal:   Soft and nontender. Non distended. There is no CVA tenderness.  No rebound, rigidity, or guarding. Genitourinary:   deferred Musculoskeletal:   Normal range of motion in all extremities. No joint effusions.  No lower extremity tenderness.  No edema. Neurologic:   Normal speech and language.  Motor grossly intact. No acute focal neurologic deficits are appreciated.  Skin:    Skin is warm, dry and intact. No rash noted.  No petechiae, purpura, or bullae.  ____________________________________________    LABS (pertinent positives/negatives) (all labs ordered are listed, but only abnormal results are  displayed) Labs Reviewed  COMPREHENSIVE METABOLIC PANEL - Abnormal; Notable for the following components:      Result Value   Glucose, Bld 266 (*)    Creatinine, Ser 2.00 (*)    GFR, Estimated 46 (*)    All other components within normal limits  CBC - Abnormal; Notable for the following components:   RBC 6.68 (*)    Hemoglobin 19.1 (*)    HCT 57.3 (*)    All other components within normal limits  URINE DRUG SCREEN, QUALITATIVE ([facility] ONLY) - Abnormal; Notable for the following components:   Cocaine Metabolite,Ur [place] POSITIVE (*)    Cannabinoid 50 Ng, Ur [place] POSITIVE (*)    All other components within normal limits  ETHANOL  SALICYLATE LEVEL  ACETAMINOPHEN LEVEL   ____________________________________________   EKG  Interpreted by me Sinus tachycardia rate 131.  Right axis, normal intervals.  Normal QRS ST segments and T waves.  No ischemic changes, no evidence of cardiac toxicity.  Repeat EKG interpreted by me, sinus tachycardia rate 132.  No interval changes.  ____________________________________________    RADIOLOGY  No results found.  ____________________________________________   PROCEDURES Procedures  ____________________________________________    CLINICAL IMPRESSION / ASSESSMENT AND PLAN / ED COURSE  Medications ordered in the ED: Medications  LORazepam (ATIVAN) tablet 1 mg (1 mg Oral Given 08/06/20 2059)    Pertinent labs & imaging results that were available during my care of the patient were reviewed by me and considered in my medical decision making (see chart for details).  Leo Fray was evaluated in Emergency Department on 08/06/2020 for the symptoms described in the history of present illness. He was evaluated in the context of the global COVID-19 pandemic, which necessitated consideration that the patient might be at risk for infection with the SARS-CoV-2 virus that causes COVID-19. Institutional protocols and algorithms that pertain to the  evaluation of patients at risk for COVID-19 are in a state of rapid change based on information released by regulatory bodies including the CDC and federal and state organizations. These policies and algorithms were followed during the patient's care in the ED.   Patient presents with paranoia in the setting of ongoing polysubstance abuse.  Currently he is calm and overall lucid, not floridly psychotic, but still states that he feels off from his usual state of mind.  Evaluated by psychiatry who plans to reassess in the morning after allowing drugs to further metabolize.  He is tolerating oral intake.  No chest pain or other symptoms or signs of possible vascular event.  Will give oral Ativan for tachycardia and restlessness as needed.  The patient has been placed in psychiatric observation due to the need to provide a safe environment for the patient while obtaining psychiatric consultation and evaluation, as well as ongoing medical and medication management to  treat the patient's condition.  The patient has not been placed under full IVC at this time.         ____________________________________________   FINAL CLINICAL IMPRESSION(S) / ED DIAGNOSES    Final diagnoses:  Polysubstance abuse (HCC)  Paranoia [facility])     ED Discharge Orders     None       Portions of this note were generated with dragon dictation software. Dictation errors may occur despite best attempts at proofreading.   Sharman Cheek, MD 08/06/20 2235

## 2020-08-07 DIAGNOSIS — F14951 Cocaine use, unspecified with cocaine-induced psychotic disorder with hallucinations: Secondary | ICD-10-CM

## 2020-08-07 LAB — CK: Total CK: 118 U/L (ref 49–397)

## 2020-08-07 LAB — RESP PANEL BY RT-PCR (FLU A&B, COVID) ARPGX2
Influenza A by PCR: NEGATIVE
Influenza B by PCR: NEGATIVE
SARS Coronavirus 2 by RT PCR: NEGATIVE

## 2020-08-07 LAB — SALICYLATE LEVEL: Salicylate Lvl: <7 mg/dL — ABNORMAL LOW (ref 7.0–30.0)

## 2020-08-07 LAB — ACETAMINOPHEN LEVEL: Acetaminophen (Tylenol), Serum: <10 ug/mL — ABNORMAL LOW (ref 10–30)

## 2020-08-07 LAB — CREATININE, SERUM
Creatinine, Ser: 1.4 mg/dL — ABNORMAL HIGH (ref 0.61–1.24)
GFR, Estimated: >60 mL/min (ref >60)

## 2020-08-07 NOTE — ED Notes (Signed)
Hourly rounding performed, patient currently asleep in hallway bed. Patient has no complaints at this time. Q15 minute rounds and monitoring via Rover and Officer to continue. 

## 2020-08-07 NOTE — ED Notes (Signed)
Pt ambulates to restroom at this time, returns to bed

## 2020-08-07 NOTE — ED Notes (Signed)
Hourly rounding performed, patient currently awake in hallway bed. Patient has no complaints at this time. Q15 minute rounds and monitoring via Rover and Officer to continue. 

## 2020-08-07 NOTE — ED Notes (Signed)
Pt to restroom

## 2020-08-07 NOTE — Consult Note (Signed)
[facility] Face-to-Face Psychiatry Consult   Reason for Consult: Psychiatric Evaluation   Referring Physician: Dr. Scotty Court Patient Identification: Bradley Casey MRN:  [id number] Principal Diagnosis: <principal problem not specified> Diagnosis:  Active Problems:   Cannabis-induced psychotic disorder with delusions (HCC)   Paranoid delusion (HCC)   Polysubstance abuse (HCC)   Psychosis (HCC)   Total Time spent with patient: 20 minutes  Subjective: "I feel like some strange things are happening to me."  Bradley Casey is a 30 y.o. male patient presented to [facility] ED via POV voluntary. Per chart review, the patient was admitted to [facility] in 2020 for Psychosis. The patient shared that he used cocaine today and says that his symptoms "' doesn't make it necessarily get worse, it's other stuff going on the past 2-3 days." Patient UDS is positive for cocaine and cannabinoids. Initially, the patient was blocking about why he was presenting to the ED. Still, the patient voiced, "people be doing certain things, I'm at home with my girlfriend, and I had this weird feeling that she had been saying weird stuff; she'll go into another room and talk." The patient reports that he has been prescribed mental health medications in the past but reports that he stopped taking them "I didn't think that I had an issue then, now I feel like I do."  The patient medication was restarted on his olanzapine 7.5 mg daily. The patient expressed that the medication helps him feel clearer in his thinking. The patient discussed visual hallucinations, "It looked like someone was standing at the door, but I only saw shoes." The patient says, "I feel like somebody is out to hurt me."  The patient was seen face-to-face by this provider; the chart was reviewed and consulted with Dr. Scotty Court on 08/06/2020 due to the patient's care. It was discussed with the EDP that the patient remained under observation overnight and will be reassessed in the  a.m. to determine if he meets the criteria for psychiatric inpatient admission; he could be discharged home. On evaluation, the patient is alert and oriented x 4, anxious, cooperative, and mood-congruent with affect. The patient does not appear to be responding to internal or external stimuli. The patient is presenting with some delusional thinking. The patient denies auditory but admits to visual hallucinations. The patient denies any suicidal, homicidal, or self-harm ideations. The patient is presenting with some psychotic and paranoid behaviors. During an encounter with the patient, he could answer questions appropriately.   HPI: Per Dr. Scotty Court, Bradley Casey is a 30 y.o. male with a history of polysubstance abuse and paranoid delusions who comes to the ED complaining of increased paranoia over the past 3 days.  Last cocaine use was earlier today and marijuana use is ongoing.  States that after using cocaine most recently his paranoia has worsened.  Denies SI or HI, denies hallucinations.   Had previously been prescribed olanzapine but is not taking it.  Past Psychiatric History: No pertinent past psychiatric history  Risk to Self:   Risk to Others:   Prior Inpatient Therapy:   Prior Outpatient Therapy:    Past Medical History: History reviewed. No pertinent past medical history. History reviewed. No pertinent surgical history. Family History: History reviewed. No pertinent family history.  Family Psychiatric  History: No pertinent family psychiatric history Social History:  Social History   Substance and Sexual Activity  Alcohol Use Yes     Social History   Substance and Sexual Activity  Drug Use Yes   Types:  Marijuana, Cocaine    Social History   Socioeconomic History   Marital status: Single    Spouse name: Not on file   Number of children: Not on file   Years of education: Not on file   Highest education level: Not on file  Occupational History   Not on file   Tobacco Use   Smoking status: Some Days    Pack years: 0.00   Smokeless tobacco: Never  Substance and Sexual Activity   Alcohol use: Yes   Drug use: Yes    Types: Marijuana, Cocaine   Sexual activity: Not on file  Other Topics Concern   Not on file  Social History Narrative   Not on file   Social Determinants of Health   Financial Resource Strain: Not on file  Food Insecurity: Not on file  Transportation Needs: Not on file  Physical Activity: Not on file  Stress: Not on file  Social Connections: Not on file   Additional Social History:    Allergies:  No Known Allergies  Labs:  Results for orders placed or performed during the hospital encounter of 08/06/20 (from the past 48 hour(s))  Comprehensive metabolic panel     Status: Abnormal   Collection Time: 08/06/20  8:20 PM  Result Value Ref Range   Sodium 137 135 - 145 mmol/L   Potassium 3.5 3.5 - 5.1 mmol/L   Chloride 98 98 - 111 mmol/L   CO2 25 22 - 32 mmol/L   Glucose, Bld 266 (H) 70 - 99 mg/dL    Comment: Glucose reference range applies only to samples taken after fasting for at least 8 hours.   BUN 11 6 - 20 mg/dL   Creatinine, Ser 1.82 (H) 0.61 - 1.24 mg/dL   Calcium 9.5 8.9 - 99.3 mg/dL   Total Protein 7.2 6.5 - 8.1 g/dL   Albumin 4.4 3.5 - 5.0 g/dL   AST 32 15 - 41 U/L   ALT 15 0 - 44 U/L   Alkaline Phosphatase 69 38 - 126 U/L   Total Bilirubin 0.8 0.3 - 1.2 mg/dL   GFR, Estimated 46 (L) >60 mL/min    Comment: (NOTE) Calculated using the CKD-EPI Creatinine Equation (2021)    Anion gap 14 5 - 15    Comment: Performed at [facility], [address] [place], [place] [address]  Ethanol     Status: None   Collection Time: 08/06/20  8:20 PM  Result Value Ref Range   Alcohol, Ethyl (B) <10 <10 mg/dL    Comment: (NOTE) Lowest detectable limit for serum alcohol is 10 mg/dL.  For medical purposes only. Performed at [facility], [address] [place], [place] [address]   cbc      Status: Abnormal   Collection Time: 08/06/20  8:20 PM  Result Value Ref Range   WBC 7.8 4.0 - 10.5 K/uL   RBC 6.68 (H) 4.22 - 5.81 MIL/uL   Hemoglobin 19.1 (H) 13.0 - 17.0 g/dL   HCT 10.1 (H) 75.1 - 02.5 %   MCV 85.8 80.0 - 100.0 fL   MCH 28.6 26.0 - 34.0 pg   MCHC 33.3 30.0 - 36.0 g/dL   RDW 85.2 77.8 - 24.2 %   Platelets 308 150 - 400 K/uL   nRBC 0.0 0.0 - 0.2 %    Comment: Performed at [facility], [address] [place], [place] [address]  Urine Drug Screen, Qualitative     Status: Abnormal  Collection Time: 08/06/20  8:20 PM  Result Value Ref Range   Tricyclic, Ur Screen NONE DETECTED NONE DETECTED   Amphetamines, Ur Screen NONE DETECTED NONE DETECTED   MDMA (Ecstasy)Ur Screen NONE DETECTED NONE DETECTED   Cocaine Metabolite,Ur [place] POSITIVE (A) NONE DETECTED   Opiate, Ur Screen NONE DETECTED NONE DETECTED   Phencyclidine (PCP) Ur S NONE DETECTED NONE DETECTED   Cannabinoid 50 Ng, Ur  POSITIVE (A) NONE DETECTED   Barbiturates, Ur Screen NONE DETECTED NONE DETECTED   Benzodiazepine, Ur Scrn NONE DETECTED NONE DETECTED   Methadone Scn, Ur NONE DETECTED NONE DETECTED    Comment: (NOTE) Tricyclics + metabolites, urine    Cutoff 1000 ng/mL Amphetamines + metabolites, urine  Cutoff 1000 ng/mL MDMA (Ecstasy), urine              Cutoff 500 ng/mL Cocaine Metabolite, urine          Cutoff 300 ng/mL Opiate + metabolites, urine        Cutoff 300 ng/mL Phencyclidine (PCP), urine         Cutoff 25 ng/mL Cannabinoid, urine                 Cutoff 50 ng/mL Barbiturates + metabolites, urine  Cutoff 200 ng/mL Benzodiazepine, urine              Cutoff 200 ng/mL Methadone, urine                   Cutoff 300 ng/mL  The urine drug screen provides only a preliminary, unconfirmed analytical test result and should not be used for non-medical purposes. Clinical consideration and professional judgment should be applied to any positive drug screen result due to possible interfering  substances. A more specific alternate chemical method must be used in order to obtain a confirmed analytical result. Gas chromatography / mass spectrometry (GC/MS) is the preferred confirm atory method. Performed at [facility], [address] [place], [place] [address]   CK     Status: None   Collection Time: 08/06/20 11:53 PM  Result Value Ref Range   Total CK 118 49 - 397 U/L    Comment: Performed at [facility], [address] [place], [place] [address]  Acetaminophen level     Status: Abnormal   Collection Time: 08/06/20 11:53 PM  Result Value Ref Range   Acetaminophen (Tylenol), Serum <10 (L) 10 - 30 ug/mL    Comment: (NOTE) Therapeutic concentrations vary significantly. A range of 10-30 ug/mL  may be an effective concentration for many patients. However, some  are best treated at concentrations outside of this range. Acetaminophen concentrations >150 ug/mL at 4 hours after ingestion  and >50 ug/mL at 12 hours after ingestion are often associated with  toxic reactions.  Performed at [facility], [address] [place], [place] [address]   Salicylate level     Status: Abnormal   Collection Time: 08/06/20 11:53 PM  Result Value Ref Range   Salicylate Lvl <7.0 (L) 7.0 - 30.0 mg/dL    Comment: Performed at [facility], [address] [place], [place] [address]    Current Facility-Administered Medications  Medication Dose Route Frequency Provider Last Rate Last Admin   OLANZapine (ZYPREXA) tablet 7.5 mg  7.5 mg Oral QHS Gillermo Murdoch, NP   7.5 mg at 08/06/20 2248   Current Outpatient Medications  Medication Sig Dispense Refill   OLANZapine (ZYPREXA) 7.5 MG tablet Take 1 tablet (7.5 mg  total) by mouth at bedtime. (Patient not taking: No sig reported) 30 tablet 1    Musculoskeletal: Strength & Muscle Tone: within normal limits Gait & Station: normal Patient leans: N/A  Psychiatric Specialty Exam:  Presentation   General Appearance: Appropriate for Environment  Eye Contact:Good  Speech:Clear and Coherent  Speech Volume:Normal  Handedness:Right   Mood and Affect  Mood:Anxious; Depressed  Affect:Blunt; Congruent   Thought Process  Thought Processes:Coherent  Descriptions of Associations:Intact  Orientation:Full (Time, Place and Person)  Thought Content:Logical  History of Schizophrenia/Schizoaffective disorder:No  Duration of Psychotic Symptoms:Greater than six months  Hallucinations:Hallucinations: Visual Description of Visual Hallucinations: He thinks someone is following him from his past life. The patient use to work on the streets.  Ideas of Reference:Paranoia  Suicidal Thoughts:Suicidal Thoughts: No  Homicidal Thoughts:Homicidal Thoughts: No   Sensorium  Memory:Immediate Fair; Recent Fair; Remote Fair  Judgment:Fair  Insight:Fair   Executive Functions  Concentration:Fair  Attention Span:Fair  Recall:Fair  Fund of Knowledge:Fair  Language:Fair   Psychomotor Activity  Psychomotor Activity:Psychomotor Activity: Normal   Assets  Assets:Communication Skills; Desire for Improvement; Intimacy; Leisure Time; Resilience; Social Support   Sleep  Sleep:Sleep: Good   Physical Exam: Physical Exam Vitals and nursing note reviewed.  Constitutional:      Appearance: Normal appearance. He is normal weight.  HENT:     Head: Normocephalic.     Nose: Nose normal.     Mouth/Throat:     Mouth: Mucous membranes are moist.  Cardiovascular:     Rate and Rhythm: Tachycardia present.  Pulmonary:     Effort: Pulmonary effort is normal.  Musculoskeletal:        General: Normal range of motion.     Cervical back: Normal range of motion and neck supple.  Neurological:     General: No focal deficit present.     Mental Status: He is alert and oriented to person, place, and time.  Psychiatric:        Attention and Perception: Attention normal. He perceives  visual hallucinations.        Mood and Affect: Mood is anxious and depressed.        Speech: Speech normal.        Behavior: Behavior normal. Behavior is cooperative.        Thought Content: Thought content is paranoid.        Cognition and Memory: Cognition normal.        Judgment: Judgment normal.   ROS Blood pressure (!) 160/99, pulse 89, temperature 98.6 F (37 C), temperature source Oral, resp. rate 18, height 5\' 11"  (1.803 m), weight 74.8 kg, SpO2 99 %. Body mass index is 23.01 kg/m.  Treatment Plan Summary: Daily contact with patient to assess and evaluate symptoms and progress in treatment, Medication management, and Plan The patient remained under observation overnight and will be reassessed in the a.m. to determine if he meets the criteria for psychiatric inpatient admission; he could be discharged home.  Disposition: No evidence of imminent risk to self or others at present.   Supportive therapy provided about ongoing stressors.  Gillermo MurdochJacqueline Garlan Drewes, NP 08/07/2020 1:00 AM

## 2020-08-07 NOTE — Discharge Instructions (Signed)
Follow up with [facility]:  [facility]  Mental health service in [place], [place] Address: [address], [place], [place] [address] Hours:  Open ? Closes 5PM Phone: [phone]  

## 2020-08-07 NOTE — ED Provider Notes (Signed)
Emergency Medicine Observation Re-evaluation Note  Bradley Casey is a 30 y.o. male, seen on rounds today.  Pt initially presented to the ED for complaints of Psychiatric Evaluation Currently, the patient is resting comfortably.  Physical Exam  BP (!) 160/99 (BP Location: Right Arm)   Pulse 89   Temp 98.6 F (37 C) (Oral)   Resp 18   Ht 5\' 11"  (1.803 m)   Wt 74.8 kg   SpO2 99%   BMI 23.01 kg/m  Physical Exam Gen: No acute distress  Resp: Normal rise and fall of chest Neuro: Moving all four extremities Psych: Resting currently, calm and cooperative when awake    ED Course / MDM  EKG:   I have reviewed the labs performed to date as well as medications administered while in observation.  Recent changes in the last 24 hours include no acute events overnight.  Initial creatinine elevated but improved with IV hydration to his baseline.  CK level normal.  Plan  Current plan is for psychiatric reevaluation in the morning. Patient is not under full IVC at this time.   Naida Escalante, , DO 08/07/20 [phone]

## 2020-08-07 NOTE — Consult Note (Signed)
[facility] Psych ED Discharge  08/07/2020 10:23 AM Bradley Casey  MRN:  [id number]  Method of visit?: Face to Face   Principal Problem: Cocaine-induced psychotic disorder with hallucinations [facility]) Discharge Diagnoses: Principal Problem:   Cocaine-induced psychotic disorder with hallucinations (HCC) Active Problems:   Paranoid delusion (HCC)   Polysubstance abuse (HCC)   Subjective: "I'm alright."  30 yo male who presented with psychosis after using cocaine and marijuana and seeing shoes.  He slept "good" last night and is clear and coherent today with no psychosis, paranoia, suicidal/homicidal ideations, and mania.  Client is not interested in substance abuse rehab, ADS information provided.  Psychiatrically stable for discharge.  Total Time spent with patient: 45 minutes  Past Psychiatric History: substance abuse  Past Medical History: History reviewed. No pertinent past medical history. History reviewed. No pertinent surgical history. Family History: History reviewed. No pertinent family history. Family Psychiatric  History: none Social History:  Social History   Substance and Sexual Activity  Alcohol Use Yes     Social History   Substance and Sexual Activity  Drug Use Yes   Types: Marijuana, Cocaine    Social History   Socioeconomic History   Marital status: Single    Spouse name: Not on file   Number of children: Not on file   Years of education: Not on file   Highest education level: Not on file  Occupational History   Not on file  Tobacco Use   Smoking status: Some Days    Pack years: 0.00   Smokeless tobacco: Never  Substance and Sexual Activity   Alcohol use: Yes   Drug use: Yes    Types: Marijuana, Cocaine   Sexual activity: Not on file  Other Topics Concern   Not on file  Social History Narrative   Not on file   Social Determinants of Health   Financial Resource Strain: Not on file  Food Insecurity: Not on file  Transportation Needs: Not on file   Physical Activity: Not on file  Stress: Not on file  Social Connections: Not on file    Tobacco Cessation:  A prescription for an FDA-approved tobacco cessation medication was offered at discharge and the patient refused  Current Medications: Current Facility-Administered Medications  Medication Dose Route Frequency Provider Last Rate Last Admin   OLANZapine (ZYPREXA) tablet 7.5 mg  7.5 mg Oral QHS Gillermo Murdoch, NP   7.5 mg at 08/06/20 2248   Current Outpatient Medications  Medication Sig Dispense Refill   OLANZapine (ZYPREXA) 7.5 MG tablet Take 1 tablet (7.5 mg total) by mouth at bedtime. (Patient not taking: No sig reported) 30 tablet 1   PTA Medications: (Not in a hospital admission)   Musculoskeletal: Strength & Muscle Tone: within normal limits Gait & Station: normal Patient leans: N/A  Psychiatric Specialty Exam:  Presentation  General Appearance: Appropriate for Environment  Eye Contact:Good  Speech:Clear and Coherent  Speech Volume:Normal  Handedness:Right  Mood and Affect  Mood:Anxious; Depressed  Affect:Blunt; Congruent   Thought Process  Thought Processes:Coherent  Descriptions of Associations:Intact  Orientation:Full (Time, Place and Person)  Thought Content:Logical  History of Schizophrenia/Schizoaffective disorder:No  Duration of Psychotic Symptoms:  Brief while using cocaine  Hallucinations:None  Ideas of Reference: WDL  Suicidal Thoughts:Suicidal Thoughts: No  Homicidal Thoughts:Homicidal Thoughts: No  Sensorium  Memory:Immediate Fair; Recent Fair; Remote Fair  Judgment:Fair  Insight:Fair  Executive Functions  Concentration:Fair  Attention Span:Fair  Recall:Fair  Fund of Knowledge:Fair  Language:Fair   Psychomotor Activity  Psychomotor  Activity:Psychomotor Activity: Normal  Assets  Assets:Communication Skills; Desire for Improvement; Intimacy; Leisure Time; Resilience; Social Support  Sleep   Sleep:Sleep: Good  Physical Exam: Physical Exam Vitals and nursing note reviewed.  Constitutional:      Appearance: Normal appearance.  HENT:     Head: Normocephalic.     Nose: Nose normal.  Pulmonary:     Effort: Pulmonary effort is normal.  Musculoskeletal:        General: Normal range of motion.     Cervical back: Normal range of motion.  Neurological:     General: No focal deficit present.     Mental Status: He is alert and oriented to person, place, and time.   Review of Systems  Psychiatric/Behavioral:  Positive for substance abuse.   All other systems reviewed and are negative. Blood pressure (!) 134/93, pulse 90, temperature 98.6 F (37 C), temperature source Oral, resp. rate 18, height 5\' 11"  (1.803 m), weight 74.8 kg, SpO2 99 %. Body mass index is 23.01 kg/m.   Demographic Factors:  Male  Loss Factors: NA  Historical Factors: NA  Risk Reduction Factors:   Sense of responsibility to family and Positive social support  Continued Clinical Symptoms:  None  Cognitive Features That Contribute To Risk:  None    Suicide Risk:  Minimal: No identifiable suicidal ideation.  Patients presenting with no risk factors but with morbid ruminations; may be classified as minimal risk based on the severity of the depressive symptoms    Plan Of Care/Follow-up recommendations:  Cocaine induced psychotic disorder with hallucinations: -Refrain from alcohol and drug use -Recommend AA/NA, client declined -[facility] resources provided Activity:  as tolerated Diet:  heart healthy diet  Disposition: Discharge home , NP 08/07/2020, 10:23 AM

## 2020-08-07 NOTE — ED Notes (Signed)
VOL/ Reassess in Am  

## 2020-08-07 NOTE — ED Notes (Signed)
VOL/patient moved to [facility]

## 2020-12-06 ENCOUNTER — Other Ambulatory Visit: Payer: Self-pay

## 2020-12-06 ENCOUNTER — Emergency Department: Payer: Self-pay

## 2020-12-06 ENCOUNTER — Inpatient Hospital Stay
Admission: EM | Admit: 2020-12-06 | Discharge: 2020-12-09 | DRG: 917 | Disposition: A | Discharge status: Home / Self Care | Payer: Self-pay | Attending: Internal Medicine | Admitting: Internal Medicine

## 2020-12-06 DIAGNOSIS — F19959 Other psychoactive substance use, unspecified with psychoactive substance-induced psychotic disorder, unspecified: Secondary | ICD-10-CM | POA: Diagnosis present

## 2020-12-06 DIAGNOSIS — J9600 Acute respiratory failure, unspecified whether with hypoxia or hypercapnia: Secondary | ICD-10-CM

## 2020-12-06 DIAGNOSIS — E8729 Other acidosis: Secondary | ICD-10-CM

## 2020-12-06 DIAGNOSIS — T405X1A Poisoning by cocaine, accidental (unintentional), initial encounter: Principal | ICD-10-CM | POA: Diagnosis present

## 2020-12-06 DIAGNOSIS — N179 Acute kidney failure, unspecified: Secondary | ICD-10-CM | POA: Diagnosis present

## 2020-12-06 DIAGNOSIS — Z046 Encounter for general psychiatric examination, requested by authority: Secondary | ICD-10-CM

## 2020-12-06 DIAGNOSIS — J9602 Acute respiratory failure with hypercapnia: Secondary | ICD-10-CM | POA: Diagnosis present

## 2020-12-06 DIAGNOSIS — F12159 Cannabis abuse with psychotic disorder, unspecified: Secondary | ICD-10-CM | POA: Diagnosis present

## 2020-12-06 DIAGNOSIS — Z9911 Dependence on respirator [ventilator] status: Secondary | ICD-10-CM

## 2020-12-06 DIAGNOSIS — Z20822 Contact with and (suspected) exposure to covid-19: Secondary | ICD-10-CM | POA: Diagnosis present

## 2020-12-06 DIAGNOSIS — R52 Pain, unspecified: Secondary | ICD-10-CM

## 2020-12-06 DIAGNOSIS — J9601 Acute respiratory failure with hypoxia: Secondary | ICD-10-CM | POA: Diagnosis present

## 2020-12-06 DIAGNOSIS — R410 Disorientation, unspecified: Secondary | ICD-10-CM

## 2020-12-06 DIAGNOSIS — F14159 Cocaine abuse with cocaine-induced psychotic disorder, unspecified: Secondary | ICD-10-CM | POA: Diagnosis present

## 2020-12-06 DIAGNOSIS — Z978 Presence of other specified devices: Secondary | ICD-10-CM

## 2020-12-06 DIAGNOSIS — E872 Acidosis, unspecified: Secondary | ICD-10-CM | POA: Diagnosis not present

## 2020-12-06 DIAGNOSIS — R319 Hematuria, unspecified: Secondary | ICD-10-CM | POA: Diagnosis not present

## 2020-12-06 DIAGNOSIS — G928 Other toxic encephalopathy: Secondary | ICD-10-CM | POA: Diagnosis present

## 2020-12-06 DIAGNOSIS — T07XXXA Unspecified multiple injuries, initial encounter: Secondary | ICD-10-CM

## 2020-12-06 DIAGNOSIS — F17200 Nicotine dependence, unspecified, uncomplicated: Secondary | ICD-10-CM | POA: Diagnosis present

## 2020-12-06 DIAGNOSIS — F1910 Other psychoactive substance abuse, uncomplicated: Secondary | ICD-10-CM | POA: Diagnosis present

## 2020-12-06 LAB — CBC
HCT: 52.4 % — ABNORMAL HIGH (ref 39.0–52.0)
Hemoglobin: 17.3 g/dL — ABNORMAL HIGH (ref 13.0–17.0)
MCH: 29.4 pg (ref 26.0–34.0)
MCHC: 33 g/dL (ref 30.0–36.0)
MCV: 89 fL (ref 80.0–100.0)
Platelets: 302 10*3/uL (ref 150–400)
RBC: 5.89 MIL/uL — ABNORMAL HIGH (ref 4.22–5.81)
RDW: 12.2 % (ref 11.5–15.5)
WBC: 9.8 10*3/uL (ref 4.0–10.5)
nRBC: 0 % (ref 0.0–0.2)

## 2020-12-06 LAB — RESP PANEL BY RT-PCR (FLU A&B, COVID) ARPGX2
Influenza A by PCR: NEGATIVE
Influenza B by PCR: NEGATIVE
SARS Coronavirus 2 by RT PCR: NEGATIVE

## 2020-12-06 LAB — ACETAMINOPHEN LEVEL: Acetaminophen (Tylenol), Serum: <10 ug/mL — ABNORMAL LOW (ref 10–30)

## 2020-12-06 LAB — COMPREHENSIVE METABOLIC PANEL
ALT: 43 U/L (ref 0–44)
AST: 86 U/L — ABNORMAL HIGH (ref 15–41)
Albumin: 4.4 g/dL (ref 3.5–5.0)
Alkaline Phosphatase: 70 U/L (ref 38–126)
Anion gap: 25 — ABNORMAL HIGH (ref 5–15)
BUN: 20 mg/dL (ref 6–20)
CO2: 16 mmol/L — ABNORMAL LOW (ref 22–32)
Calcium: 9.9 mg/dL (ref 8.9–10.3)
Chloride: 96 mmol/L — ABNORMAL LOW (ref 98–111)
Creatinine, Ser: 2.44 mg/dL — ABNORMAL HIGH (ref 0.61–1.24)
GFR, Estimated: 36 mL/min — ABNORMAL LOW (ref >60)
Glucose, Bld: 276 mg/dL — ABNORMAL HIGH (ref 70–99)
Potassium: 4 mmol/L (ref 3.5–5.1)
Sodium: 137 mmol/L (ref 135–145)
Total Bilirubin: 0.8 mg/dL (ref 0.3–1.2)
Total Protein: 7.7 g/dL (ref 6.5–8.1)

## 2020-12-06 LAB — MAGNESIUM: Magnesium: 2.8 mg/dL — ABNORMAL HIGH (ref 1.7–2.4)

## 2020-12-06 LAB — BLOOD GAS, ARTERIAL
Acid-base deficit: 0.2 mmol/L (ref 0.0–2.0)
Bicarbonate: 24 mmol/L (ref 20.0–28.0)
FIO2: 40
MECHVT: 440 mL
O2 Saturation: 99.6 %
PEEP: 5 cmH2O
Patient temperature: 37
RATE: 20 resp/min
pCO2 arterial: 37 mmHg (ref 32.0–48.0)
pH, Arterial: 7.42 (ref 7.350–7.450)
pO2, Arterial: 171 mmHg — ABNORMAL HIGH (ref 83.0–108.0)

## 2020-12-06 LAB — PHOSPHORUS: Phosphorus: 6.3 mg/dL — ABNORMAL HIGH (ref 2.5–4.6)

## 2020-12-06 LAB — ETHANOL: Alcohol, Ethyl (B): <10 mg/dL (ref <10)

## 2020-12-06 LAB — TROPONIN I (HIGH SENSITIVITY): Troponin I (High Sensitivity): 8 ng/L (ref <18)

## 2020-12-06 LAB — CBG MONITORING, ED: Glucose-Capillary: 171 mg/dL — ABNORMAL HIGH (ref 70–99)

## 2020-12-06 LAB — PROTIME-INR
INR: 1.2 (ref 0.8–1.2)
Prothrombin Time: 14.9 seconds (ref 11.4–15.2)

## 2020-12-06 LAB — SALICYLATE LEVEL: Salicylate Lvl: <7 mg/dL — ABNORMAL LOW (ref 7.0–30.0)

## 2020-12-06 LAB — CKMB (ARMC ONLY): CK, MB: 3.9 ng/mL (ref 0.5–5.0)

## 2020-12-06 LAB — TYPE AND SCREEN
ABO/RH(D): A POS
Antibody Screen: NEGATIVE

## 2020-12-06 LAB — LACTIC ACID, PLASMA: Lactic Acid, Venous: >9 mmol/L (ref 0.5–1.9)

## 2020-12-06 MED ORDER — DOCUSATE SODIUM 50 MG/5ML PO LIQD
100.0000 mg | Freq: Two times a day (BID) | ORAL | Status: DC | PRN
  Filled 2020-12-06 (×2): qty 10

## 2020-12-06 MED ORDER — MIDAZOLAM HCL 2 MG/2ML IJ SOLN: 6.0000 mg | Freq: Once | INTRAMUSCULAR | Status: AC | PRN

## 2020-12-06 MED ORDER — POLYETHYLENE GLYCOL 3350 17 G PO PACK
17.0000 g | PACK | Freq: Every day | ORAL | Status: DC
  Administered 2020-12-07: 17 g
  Filled 2020-12-06: qty 1

## 2020-12-06 MED ORDER — LACTATED RINGERS IV BOLUS
1500.0000 mL | Freq: Once | INTRAVENOUS | Status: AC
  Administered 2020-12-06: 1500 mL via INTRAVENOUS

## 2020-12-06 MED ORDER — POLYETHYLENE GLYCOL 3350 17 G PO PACK: 17.0000 g | PACK | Freq: Every day | ORAL | Status: DC | PRN

## 2020-12-06 MED ORDER — FENTANYL 2500MCG IN NS 250ML (10MCG/ML) PREMIX INFUSION
50.0000 ug/h | INTRAVENOUS | Status: DC
  Administered 2020-12-06: 50 ug/h via INTRAVENOUS
  Administered 2020-12-07 – 2020-12-08 (×2): 150 ug/h via INTRAVENOUS
  Filled 2020-12-06 (×3): qty 250

## 2020-12-06 MED ORDER — PANTOPRAZOLE SODIUM 40 MG IV SOLR
40.0000 mg | Freq: Every day | INTRAVENOUS | Status: DC
  Administered 2020-12-06 – 2020-12-08 (×3): 40 mg via INTRAVENOUS
  Filled 2020-12-06 (×2): qty 40

## 2020-12-06 MED ORDER — PROPOFOL 1000 MG/100ML IV EMUL
5.0000 ug/kg/min | INTRAVENOUS | Status: DC
  Administered 2020-12-06: 15 ug/kg/min via INTRAVENOUS
  Administered 2020-12-07: 50 ug/kg/min via INTRAVENOUS
  Administered 2020-12-07: 40 ug/kg/min via INTRAVENOUS
  Administered 2020-12-07 (×2): 50 ug/kg/min via INTRAVENOUS
  Administered 2020-12-07: 40 ug/kg/min via INTRAVENOUS
  Administered 2020-12-08: 50 ug/kg/min via INTRAVENOUS
  Filled 2020-12-06 (×8): qty 100

## 2020-12-06 MED ORDER — MIDAZOLAM HCL 5 MG/5ML IJ SOLN
INTRAMUSCULAR | Status: AC
  Administered 2020-12-06: 6 mg via INTRAVENOUS
  Filled 2020-12-06: qty 10

## 2020-12-06 MED ORDER — MIDAZOLAM HCL 2 MG/2ML IJ SOLN
2.0000 mg | INTRAMUSCULAR | Status: DC | PRN
  Administered 2020-12-07 (×2): 2 mg via INTRAVENOUS

## 2020-12-06 MED ORDER — SUCCINYLCHOLINE CHLORIDE 200 MG/10ML IV SOSY
PREFILLED_SYRINGE | INTRAVENOUS | Status: AC
  Administered 2020-12-06: 150 mg
  Filled 2020-12-06: qty 10

## 2020-12-06 MED ORDER — LACTATED RINGERS IV SOLN: INTRAVENOUS | Status: DC

## 2020-12-06 MED ORDER — MIDAZOLAM HCL 5 MG/5ML IJ SOLN
INTRAMUSCULAR | Status: AC
  Administered 2020-12-06: 4 mg via INTRAVENOUS
  Filled 2020-12-06: qty 5

## 2020-12-06 MED ORDER — ONDANSETRON HCL 4 MG/2ML IJ SOLN: 4.0000 mg | Freq: Four times a day (QID) | INTRAMUSCULAR | Status: DC | PRN

## 2020-12-06 MED ORDER — HEPARIN SODIUM (PORCINE) 5000 UNIT/ML IJ SOLN
5000.0000 [IU] | Freq: Three times a day (TID) | INTRAMUSCULAR | Status: DC
  Administered 2020-12-06 – 2020-12-08 (×5): 5000 [IU] via SUBCUTANEOUS
  Filled 2020-12-06 (×5): qty 1

## 2020-12-06 MED ORDER — FENTANYL BOLUS VIA INFUSION
50.0000 ug | INTRAVENOUS | Status: DC | PRN
Start: 2020-12-06 — End: 2020-12-08
  Filled 2020-12-06: qty 100

## 2020-12-06 MED ORDER — IOHEXOL 350 MG/ML SOLN
80.0000 mL | Freq: Once | INTRAVENOUS | Status: AC | PRN
  Administered 2020-12-06: 80 mL via INTRAVENOUS

## 2020-12-06 MED ORDER — FENTANYL CITRATE PF 50 MCG/ML IJ SOSY
50.0000 ug | PREFILLED_SYRINGE | Freq: Once | INTRAMUSCULAR | Status: AC
  Administered 2020-12-06: 50 ug via INTRAVENOUS

## 2020-12-06 MED ORDER — DOCUSATE SODIUM 50 MG/5ML PO LIQD
100.0000 mg | Freq: Two times a day (BID) | ORAL | Status: DC
  Administered 2020-12-07 (×3): 100 mg
  Filled 2020-12-06 (×4): qty 10

## 2020-12-06 MED ORDER — LACTATED RINGERS IV BOLUS
500.0000 mL | Freq: Once | INTRAVENOUS | Status: AC
  Administered 2020-12-07: 500 mL via INTRAVENOUS

## 2020-12-06 MED ORDER — INSULIN ASPART 100 UNIT/ML IJ SOLN: 0.0000 [IU] | INTRAMUSCULAR | Status: DC

## 2020-12-06 MED ORDER — ALBUTEROL SULFATE (2.5 MG/3ML) 0.083% IN NEBU
2.5000 mg | INHALATION_SOLUTION | RESPIRATORY_TRACT | Status: DC | PRN
Start: 2020-12-06 — End: 2020-12-09

## 2020-12-06 MED ORDER — MIDAZOLAM HCL 2 MG/2ML IJ SOLN
2.0000 mg | INTRAMUSCULAR | Status: AC | PRN
  Administered 2020-12-07: 2 mg via INTRAVENOUS
  Administered 2020-12-08: 4 mg via INTRAVENOUS
  Filled 2020-12-06: qty 2
  Filled 2020-12-06 (×2): qty 4
  Filled 2020-12-06: qty 2

## 2020-12-06 MED ORDER — KETAMINE HCL 10 MG/ML IJ SOLN
INTRAMUSCULAR | Status: AC
  Administered 2020-12-06: 150 mg
  Filled 2020-12-06: qty 1

## 2020-12-06 NOTE — Progress Notes (Signed)
eLink Physician-Brief Progress Note Patient Name: Bradley Casey DOB: [birth year] MRN: [id number]   Date of Service  12/06/2020  HPI/Events of Note  30 y.o. male EMS reports potentially drug use, and jumped out of a second story building.  He fell onto a concrete driveway and then ran about 2 blocks been very agitated confused and combative , low GCS-intubated from ED and in ICU. AKI. No obvious sepsis.   Data: Reviewed Elevated LA, wbc normal.Cr 2.4 pH 7.42 Hco3 at 24 PAN CT : no #, bleeding or injury. CKMB/trop normalEKG qtc ok. Non specific T changes. LVH?Marland Kitchen  Covid/flu neg. Alcohol/salicylate levels ok. Not elevated.  Camera: Fio2 at 40%, VT < 8 ml/ibw. VS stable.      eICU Interventions  - s/p 1.5 lit fluids. Not on abx- no obvious sepsis. LA elevated is from mostly drug abuse. Follow tox screening. - VAP bundle. CBG goals < 180 on SSI - VTE: on sq heparin - watch for fever, leukocytosis. - SAT and SBT daily in AM. - avoid nephrotoxic meds, follow urine out put. -ET tube in place on CT chest report.     Intervention Category Major Interventions: Respiratory failure - evaluation and management;Delirium, psychosis, severe agitation - evaluation and management Evaluation Type: New Patient Evaluation  Ranee Gosselin 12/06/2020, 11:36 PM

## 2020-12-06 NOTE — ED Triage Notes (Signed)
pt jumped from 2nd story building; pt is confused 273 cbg; 121/63 bp; o2 96 on RA; pt was c/o SOB;

## 2020-12-06 NOTE — ED Notes (Signed)
Pt going to ct now.

## 2020-12-06 NOTE — ED Provider Notes (Signed)
[facility] Emergency Department Provider Note ____________________________________________   Event Date/Time   First MD Initiated Contact with Patient 12/06/20 2122     (approximate)  I have reviewed the triage vital signs and the nursing notes.   HISTORY  Chief Complaint Fall and Altered Mental Status  EM caveat: Severe altered mental status, delirium, agitation  HPI Bradley Casey is a 30 y.o. male EMS reports potentially drug use, and jumped out of a second story building.  He fell onto a concrete driveway and then ran about 2 blocks been very agitated confused and combative at time.    History reviewed. No pertinent past medical history.  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 No pertinent surgical history.  Prior to Admission medications   Medication Sig Start Date End Date Taking? Authorizing Provider  OLANZapine (ZYPREXA) 7.5 MG tablet Take 1 tablet (7.5 mg total) by mouth at bedtime. 01/25/19   Money, Gerlene Burdock, FNP    Allergies Patient has no known allergies.  History reviewed. No pertinent family history.  Social History Social History   Tobacco Use   Smoking status: Some Days   Smokeless tobacco: Never  Substance Use Topics   Alcohol use: Yes   Drug use: Yes    Types: Marijuana, Cocaine    Review of Systems  EM caveat  ____________________________________________   PHYSICAL EXAM:  VITAL SIGNS: ED Triage Vitals [12/06/20 2126]  Enc Vitals Group     BP      Pulse      Resp      Temp      Temp src      SpO2      Weight 165 lb 5.5 oz (75 kg)     Height 5\' 11"  (1.803 m)     Head Circumference       Peak Flow      Pain Score 0     Pain Loc      Pain Edu?      Excl. in GC?     Constitutional: Disoriented, gyrating about the bed, confused, perseverating.  Appears moderately to severely agitated Eyes: Conjunctivae are normal. Head: Atraumatic.  Cervical collar applied once able to do so safely patient had to be called and sedated as he was moving about the bed thrashing Nose: No congestion/rhinnorhea. Mouth/Throat: Mucous membranes are dry Neck: No stridor.  Cardiovascular: Normal rate, regular rhythm. Grossly normal heart sounds.  Good peripheral circulation. Respiratory:  No retractions. Lungs CTAB. Gastrointestinal: Soft and nontender. No distention. Musculoskeletal: No lower extremity tenderness nor edema.  Noted some abrasions around his feet but no obvious deformities or no evidence of obvious long bone fracture.  Moves all extremities Neurologic: Difficult access, highly agitated, writhing about left and right on the bed, reaching out, but does not follow commands.   Skin:  Skin is warm, dry and intact. No rash noted sipped some abrasions noted primarily around his feet and ankles. Psychiatric: Mood and affect are elevated  ____________________________________________   LABS (all labs ordered are listed, but only abnormal results are displayed)  Labs Reviewed  COMPREHENSIVE METABOLIC PANEL - Abnormal; Notable for the following components:      Result Value   Chloride  96 (*)    CO2 16 (*)    Glucose, Bld 276 (*)    Creatinine, Ser 2.44 (*)    AST 86 (*)    GFR, Estimated 36 (*)    Anion gap 25 (*)    All other components within normal limits  CBC - Abnormal; Notable for the following components:   RBC 5.89 (*)    Hemoglobin 17.3 (*)    HCT 52.4 (*)    All other components within normal limits  LACTIC ACID, PLASMA - Abnormal; Notable for the following components:   Lactic Acid, Venous >9.0 (*)    All other components within normal limits  BLOOD GAS, ARTERIAL -  Abnormal; Notable for the following components:   pO2, Arterial 171 (*)    All other components within normal limits  SALICYLATE LEVEL - Abnormal; Notable for the following components:   Salicylate Lvl <7.0 (*)    All other components within normal limits  ACETAMINOPHEN LEVEL - Abnormal; Notable for the following components:   Acetaminophen (Tylenol), Serum <10 (*)    All other components within normal limits  MAGNESIUM - Abnormal; Notable for the following components:   Magnesium 2.8 (*)    All other components within normal limits  PHOSPHORUS - Abnormal; Notable for the following components:   Phosphorus 6.3 (*)    All other components within normal limits  CBG MONITORING, ED - Abnormal; Notable for the following components:   Glucose-Capillary 171 (*)    All other components within normal limits  RESP PANEL BY RT-PCR (FLU A&B, COVID) ARPGX2  ETHANOL  PROTIME-INR  CKMB ([facility] ONLY)  URINALYSIS, COMPLETE (UACMP) WITH MICROSCOPIC  URINE DRUG SCREEN, QUALITATIVE ([facility] ONLY)  BASIC METABOLIC PANEL  CBC  MAGNESIUM  PHOSPHORUS  HIV ANTIBODY (ROUTINE TESTING W REFLEX)  TYPE AND SCREEN  TROPONIN I (HIGH SENSITIVITY)   ____________________________________________  EKG  Reviewed inter by me at 2247 Heart rate 80 QRS 99 QTc 440 Normal sinus rhythm, litters a somewhat hyperacute appearance of T waves noted in V2 through V4 primarily, likely secondary to left ventricular hypertrophy but ischemia could be on the differential.  However given the history reported, I do not think this represents STEMI but likely represents repolarization.  Await first troponin however ____________________________________________  RADIOLOGY  CT HEAD WO CONTRAST  Result Date: 12/06/2020 CLINICAL DATA:  Head trauma, intracranial venous injury suspected. EXAM: CT HEAD WITHOUT CONTRAST TECHNIQUE: Contiguous axial images were obtained from the base of the skull through the vertex without intravenous  contrast. COMPARISON:  None. FINDINGS: Brain: No acute intracranial abnormality. Specifically, no hemorrhage, hydrocephalus, mass lesion, acute infarction, or significant intracranial injury. Vascular: No hyperdense vessel or unexpected calcification. Skull: No acute calvarial abnormality. Sinuses/Orbits: No acute findings Other: None IMPRESSION: Normal study. Electronically Signed   By: Charlett Nose M.D.   On: 12/06/2020 22:13   CT CERVICAL SPINE WO CONTRAST  Result Date: 12/06/2020 CLINICAL DATA:  Facial trauma EXAM: CT CERVICAL SPINE WITHOUT CONTRAST TECHNIQUE: Multidetector CT imaging of the cervical spine was performed without intravenous contrast. Multiplanar CT image reconstructions were also generated. COMPARISON:  None. FINDINGS: Alignment: Normal Skull base and vertebrae: No acute fracture. No primary bone lesion or focal pathologic process. Soft tissues and spinal canal: No prevertebral fluid or swelling. No visible canal hematoma. Disc levels:  Normal Upper chest: Negative Other: Endotracheal tube in place with the trachea. NG tube in the esophagus. IMPRESSION: No bony abnormality. Electronically Signed   By: Charlett Nose M.D.  On: 12/06/2020 22:14   CT CHEST ABDOMEN PELVIS W CONTRAST  Result Date: 12/06/2020 CLINICAL DATA:  Trauma, jumped from 2nd story building EXAM: CT CHEST, ABDOMEN, AND PELVIS WITH CONTRAST TECHNIQUE: Multidetector CT imaging of the chest, abdomen and pelvis was performed following the standard protocol during bolus administration of intravenous contrast. CONTRAST:  16mL OMNIPAQUE IOHEXOL 350 MG/ML SOLN COMPARISON:  None. FINDINGS: CT CHEST FINDINGS Cardiovascular: No evidence of traumatic aortic injury. The heart is normal in size.  No pericardial effusion. Mediastinum/Nodes: No evidence of anterior mediastinal hematoma. No suspicious mediastinal lymphadenopathy. Visualized thyroid is unremarkable. Lungs/Pleura: Endotracheal tube terminates 2.5 cm above the carina. Lungs  are essentially clear. No focal consolidation. No suspicious pulmonary nodules. No pleural effusion or pneumothorax. Musculoskeletal: No fracture is seen. Sternum, clavicles, scapulae, and bilateral ribs are intact. Thoracic spine is within normal limits. CT ABDOMEN PELVIS FINDINGS Hepatobiliary: Liver is within normal limits. No perihepatic fluid/hemorrhage. Gallbladder is unremarkable. No intrahepatic or extrahepatic ductal dilatation. Pancreas: Within normal limits. Spleen: Within normal limits.  No perisplenic fluid/hemorrhage. Adrenals/Urinary Tract: Adrenal glands are within normal limits. Kidneys are within normal limits.  No hydronephrosis. Bladder is mildly thick-walled although underdistended. Stomach/Bowel: Enteric tube terminates in the proximal stomach. No evidence of bowel obstruction. Normal appendix (series 4/image 108). Vascular/Lymphatic: No evidence of abdominal aortic aneurysm. No suspicious abdominopelvic lymphadenopathy. Reproductive: Prostate is unremarkable. Other: No abdominopelvic ascites. No hemoperitoneum or free air. Musculoskeletal: No fracture is seen. Lumbar spine, pelvis, and proximal femurs are intact. IMPRESSION: No evidence of traumatic injury to the chest, abdomen, or pelvis. Endotracheal tube terminates 2.5 cm above the carina. Enteric tube terminates in the proximal stomach. Electronically Signed   By: Charline Bills M.D.   On: 12/06/2020 22:18    CT imaging reviewed, CT chest abdomen pelvis negative for acute traumatic injury.  CT head and cervical spine without acute traumatic injury ____________________________________________   PROCEDURES  Procedure(s) performed:  Intubation  Procedure Name: Intubation Date/Time: 12/06/2020 10:42 PM Performed by: Sharyn Creamer, MD Pre-anesthesia Checklist: Patient identified, Emergency Drugs available, Suction available and Patient being monitored Oxygen Delivery Method: Nasal cannula Preoxygenation: Pre-oxygenation with  100% oxygen Induction Type: IV induction and Rapid sequence Laryngoscope Size: Glidescope and 3 Grade View: Grade I Tube type: Subglottic suction tube Tube size: 7.5 mm Number of attempts: 1 Intubation method: spinal precautions. Placement Confirmation: ETT inserted through vocal cords under direct vision, CO2 detector and Breath sounds checked- equal and bilateral Secured at: 23 cm Tube secured with: ETT holder Dental Injury: Teeth and Oropharynx as per pre-operative assessment  Comments: Affirmed placement with end-tidal CO2 detector, direct visualization.  CT imaging confirms placement of tracheal tube as well     Critical Care performed: Yes, see critical care note(s)  CRITICAL CARE Performed by: Sharyn Creamer   Total critical care time: 45 minutes  Critical care time was exclusive of separately billable procedures and treating other patients.  Critical care was necessary to treat or prevent imminent or life-threatening deterioration.  Critical care was time spent personally by me on the following activities: development of treatment plan with patient and/or surrogate as well as nursing, discussions with consultants, evaluation of patient's response to treatment, examination of patient, obtaining history from patient or surrogate, ordering and performing treatments and interventions, ordering and review of laboratory studies, ordering and review of radiographic studies, pulse oximetry and re-evaluation of patient's condition.  ____________________________________________   INITIAL IMPRESSION / ASSESSMENT AND PLAN / ED COURSE  Pertinent labs & imaging results  that were available during my care of the patient were reviewed by me and considered in my medical decision making (see chart for details).   Patient presents highly agitated after reported fall to her second story window.  No clear obvious evidence of severe trauma by clinical exam but his altered mental status is severe  perseverating agitated.  Patient required sedation and intubation for airway protection, also needed facilitation of emergent evaluation and imaging for trauma.  Patient's father arrived, affirms concern he reports he knows his son was using drugs this evening and he thinks that caused him to think erratically and jump from the window and run.  Father does believe there is underlying labral psychosis as well.  Placed under IVC until patient can be appropriate evaluate by psychiatrist given the unknown cause for his jumping out the window  Clinical Course as of 12/06/20 2330  Sun Dec 06, 2020  2240 The patient is noted to have a lactate>4. With the current information available to me, I don't think the patient is in septic shock. The lactate>4, is related to drug / toxin overdose and trauma  [MQ]  2258 Patient noted to have evidence of metabolic acidosis CO2 of 16, also elevated anion gap.  Await CK, salicylate level, but do note elevated lactate which I suspect is likely the driving factor here.   [MQ]    Clinical Course User Index [MQ] Sharyn Creamer, MD   ----------------------------------------- 10:45 PM on 12/06/2020 ----------------------------------------- Patient resting flaccidly, vital signs stable on propofol drip.  At this point imaging secondary trauma assessment did not show clear evidence of major trauma.  Await further laboratory testing, but this point is stable for admission to our ICU here.  Under IVC.  Admission discussed with Sharol Roussel nurse practitioner who is excepting for ICU admission  Lactic acid greater than 9, CK pending, suspect likely secondary to trauma and intense physical activity.  At this point I do not have clear evidence to suspect sepsis.  Hemoglobin is appropriate hemodynamic stable no evidence of acute bleeding on trauma scans or by clinical exam    ____________________________________________   FINAL CLINICAL IMPRESSION(S) / ED DIAGNOSES  Final  diagnoses:  Involuntary commitment  Abrasions of multiple sites  Acute delirium        Note:  This document was prepared using Dragon voice recognition software and may include unintentional dictation errors       Sharyn Creamer, MD 12/06/20 2330

## 2020-12-06 NOTE — ED Notes (Signed)
2139: 150 ketamine going in 2140 150 of succ went in

## 2020-12-06 NOTE — ED Notes (Signed)
Pt was bladder scanned and it presented with <31ml

## 2020-12-06 NOTE — ED Notes (Addendum)
Pt has a 23 tube at the teeth

## 2020-12-06 NOTE — H&P (Signed)
NAME:  Bradley Casey, MRN:  [id number], DOB:  [birth year], LOS: 0 ADMISSION DATE:  12/06/2020, CONSULTATION DATE: 12/06/2020 REFERRING MD: Dr. Fanny Bien, CHIEF COMPLAINT:   Fall and altered mental status  History of Present Illness:  30 year old male presented to [facility] ED via EMS from his mother's home on 12/06/2020 after he jumped out of a second story building and fell onto a concrete driveway.   Per the patient's father's report, he has a history of polysubstance abuse but particularly cocaine abuse.  This evening the patient had gone over to his mother's house and was acting erratically, eventually jumping out the second story window and landing on the concrete driveway.  His father went out to assist him as EMS and the fire department were arriving.  The patient became very agitated and confused and ran away from the first responders for about 2 blocks.  The patient's father also believes there is underlying psychosis/mental health disorder in addition to substance abuse as he reports the patient goes through periods of paranoia. ED course: Upon arrival to ED patient was severely altered and agitated.  Patient required sedation for facilitation of emergent evaluation and imaging due to trauma and subsequent intubation for airway protection.  Patient was placed under IVC until he can be evaluated by a psychiatrist given the unknown cause for him jumping out of the window.  He received 2 L IV fluid bolus. Initial Vitals: Afebrile at 98.7, mildly tachypneic at 22, HR 97, BP 115/67 & SPO2 99% on 40% FiO2 (with ventilatory support) Significant labs: (Labs/ Imaging personally reviewed) I, Cheryll Cockayne Rust-Chester, AGACNP-BC, personally viewed and interpreted this ECG. Chemistry: Na+: 137, K+: 4, BUN/Cr.:  20/2.44, Serum CO2/ AG: 16/25 Hematology: WBC: 9.8, Hgb: 17.3,  Troponin: 8, CK-MB: 3.9, Lactic/ PCT: > 9, COVID-19 & Influenza A/B: Negative ABG: 7.42/37/171/24 Acetaminophen level negative, salicylate  level negative CT head without contrast 12/06/2020: No acute intracranial abnormality CT cervical spine without contrast 12/06/2020: No bony abnormality CT chest abdomen pelvis with contrast 12/06/2020: No evidence of traumatic injury to the chest abdomen or pelvis ETT and gastric tube in appropriate placement  PCCM consulted for admission. Pertinent  Medical History  Polysubstance abuse (cocaine, marijuana, ETOH)   Significant Hospital Events: Including procedures, antibiotic start and stop dates in addition to other pertinent events   12/06/2020-patient admitted to ICU status post jumping out of a second story window and landing on a concrete driveway, emergently intubated requiring mechanical ventilation in the ED due to severe altered mental status and agitation  Interim History / Subjective:  Patient intubated and sedated requiring mechanical ventilatory support  Objective   Blood pressure 131/85, pulse 95, temperature 98.6 F (37 C), resp. rate (!) 23, height 5\' 11"  (1.803 m), weight 75 kg, SpO2 99 %.    Vent Mode: AC FiO2 (%):  [40 %] 40 % Set Rate:  [20 bmp] 20 bmp Vt Set:  [440 mL] 440 mL PEEP:  [5 cmH20] 5 cmH20  No intake or output data in the 24 hours ending 12/06/20 2254 Filed Weights   12/06/20 2126  Weight: 75 kg    Examination: General: Adult male, critically ill, lying in bed intubated & sedated requiring mechanical ventilation, NAD HEENT: MM pink/moist, anicteric, atraumatic, neck supple Neuro: Sedated, unable to follow commands-purposeful movements, PERRL +4 , MAE CV: s1s2 RRR, NSR on monitor, no r/m/g Pulm: Regular, non labored on PRVC 40% FiO2 and PEEP of 5, breath sounds clear throughout GI: soft, flat, bs x 4  GU: foley in place with minimal hematuria Skin: Scabbed abrasions noted on left knee/left posterior shoulder and back.  Abrasion on left ankle and bilateral feet Extremities: warm/dry, pulses + 2 R/P, no edema noted  Resolved Hospital Problem  list     Assessment & Plan:  Acute Respiratory Failure secondary to inability to protect airway/ in the setting of severe altered mental status requiring sedation PMHx: Polysubstance abuse - Ventilator settings: PRVC  8 mL/kg, 40% FiO2, 5 PEEP, continue ventilator support & lung protective strategies - Wean PEEP & FiO2 as tolerated, maintain SpO2 > 90% - Head of bed elevated 30 degrees, VAP protocol in place - Plateau pressures less than 30 cm H20  - Intermittent chest x-ray & ABG PRN - Daily WUA with SBT as tolerated  - Ensure adequate pulmonary hygiene  - F/u cultures, trend PCT - bronchodilators PRN - PAD protocol in place: continue Fentanyl drip & Propofol drip  Acute Kidney Injury in the setting of unknown substance ingestion Increased anion gap metabolic acidosis-resolving (ABG post intubation revealed normalized bicarb and pH) Lactic acidosis-suspect this is from toxic ingestion Baseline Cr: 1.4, Cr on admission: 2.44 -Follow-up lactic post IV fluid bolus - Strict I/O's: alert provider if UOP < 0.5 mL/kg/hr - IVF hydration: will initiate LR at 100 mL an hour > follow-up CK - Daily BMP, replace electrolytes PRN - Avoid nephrotoxic agents as able, ensure adequate renal perfusion  Severe acute encephalopathy in the setting of known polysubstance abuse and suspected mental health disorder Only PTA med list today Zyprexa, but father confirms that patient is not taking this -Supportive care -IVC in place -[facility] & psych consult, appreciate input  Best Practice (right click and "Reselect all SmartList Selections" daily)  Diet/type: NPO w/ meds via tube DVT prophylaxis: prophylactic heparin  GI prophylaxis: PPI Lines: N/A Foley:  Yes, and it is still needed Code Status:  full code Last date of multidisciplinary goals of care discussion [12/06/20]  Labs   CBC: Recent Labs  Lab 12/06/20 2127  WBC 9.8  HGB 17.3*  HCT 52.4*  MCV 89.0  PLT 302    Basic Metabolic  Panel: Recent Labs  Lab 12/06/20 2127  NA 137  K 4.0  CL 96*  CO2 16*  GLUCOSE 276*  BUN 20  CREATININE 2.44*  CALCIUM 9.9   GFR: Estimated Creatinine Clearance: 47.4 mL/min (A) (by C-G formula based on SCr of 2.44 mg/dL (H)). Recent Labs  Lab 12/06/20 2127  WBC 9.8  LATICACIDVEN >9.0*    Liver Function Tests: Recent Labs  Lab 12/06/20 2127  AST 86*  ALT 43  ALKPHOS 70  BILITOT 0.8  PROT 7.7  ALBUMIN 4.4   No results for input(s): LIPASE, AMYLASE in the last 168 hours. No results for input(s): AMMONIA in the last 168 hours.  ABG    Component Value Date/Time   PHART 7.42 12/06/2020 2140   PCO2ART 37 12/06/2020 2140   PO2ART 171 (H) 12/06/2020 2140   HCO3 24.0 12/06/2020 2140   ACIDBASEDEF 0.2 12/06/2020 2140   O2SAT 99.6 12/06/2020 2140     Coagulation Profile: Recent Labs  Lab 12/06/20 2127  INR 1.2    Cardiac Enzymes: Recent Labs  Lab 12/06/20 2127  CKMB 3.9    HbA1C: No results found for: HGBA1C  CBG: Recent Labs  Lab 12/06/20 2243  GLUCAP 171*    Review of Systems:   UTA patient intubated and sedated unable to participate in interview  Past Medical History:  He,  has no past medical history on file.   Surgical History:  History reviewed. No pertinent surgical history.   Social History:   reports that he has been smoking. He has never used smokeless tobacco. He reports current alcohol use. He reports current drug use. Drugs: Marijuana and Cocaine.   Family History:  His family history is not on file.   Allergies No Known Allergies   Home Medications  Prior to Admission medications   Medication Sig Start Date End Date Taking? Authorizing Provider  OLANZapine (ZYPREXA) 7.5 MG tablet Take 1 tablet (7.5 mg total) by mouth at bedtime. 01/25/19   Money, Gerlene Burdock, FNP     Critical care time: 65 minutes     Betsey Holiday, AGACNP-BC Acute Care Nurse Practitioner [facility]   [phone] /  [phone] Please see Amion for pager details.

## 2020-12-07 ENCOUNTER — Inpatient Hospital Stay: Payer: Self-pay

## 2020-12-07 DIAGNOSIS — E8729 Other acidosis: Secondary | ICD-10-CM

## 2020-12-07 DIAGNOSIS — Z9911 Dependence on respirator [ventilator] status: Secondary | ICD-10-CM

## 2020-12-07 DIAGNOSIS — J9600 Acute respiratory failure, unspecified whether with hypoxia or hypercapnia: Secondary | ICD-10-CM

## 2020-12-07 DIAGNOSIS — N179 Acute kidney failure, unspecified: Secondary | ICD-10-CM

## 2020-12-07 DIAGNOSIS — E872 Acidosis, unspecified: Secondary | ICD-10-CM

## 2020-12-07 DIAGNOSIS — Z978 Presence of other specified devices: Secondary | ICD-10-CM

## 2020-12-07 LAB — GLUCOSE, CAPILLARY
Glucose-Capillary: 89 mg/dL (ref 70–99)
Glucose-Capillary: 56 mg/dL — ABNORMAL LOW (ref 70–99)
Glucose-Capillary: 77 mg/dL (ref 70–99)
Glucose-Capillary: 54 mg/dL — ABNORMAL LOW (ref 70–99)
Glucose-Capillary: 123 mg/dL — ABNORMAL HIGH (ref 70–99)

## 2020-12-07 LAB — URINALYSIS, COMPLETE (UACMP) WITH MICROSCOPIC: Specific Gravity, Urine: 1.048 — ABNORMAL HIGH (ref 1.005–1.030)

## 2020-12-07 LAB — URINE DRUG SCREEN, QUALITATIVE (ARMC ONLY)
Amphetamines, Ur Screen: NOT DETECTED
Barbiturates, Ur Screen: NOT DETECTED
Benzodiazepine, Ur Scrn: POSITIVE — AB
Cannabinoid 50 Ng, Ur ~~LOC~~: POSITIVE — AB
Cocaine Metabolite,Ur ~~LOC~~: POSITIVE — AB
MDMA (Ecstasy)Ur Screen: NOT DETECTED
Methadone Scn, Ur: NOT DETECTED
Opiate, Ur Screen: NOT DETECTED
Phencyclidine (PCP) Ur S: NOT DETECTED
Tricyclic, Ur Screen: NOT DETECTED

## 2020-12-07 LAB — BASIC METABOLIC PANEL
Anion gap: 5 (ref 5–15)
BUN: 15 mg/dL (ref 6–20)
CO2: 30 mmol/L (ref 22–32)
Calcium: 8.9 mg/dL (ref 8.9–10.3)
Chloride: 102 mmol/L (ref 98–111)
Creatinine, Ser: 1.46 mg/dL — ABNORMAL HIGH (ref 0.61–1.24)
GFR, Estimated: >60 mL/min (ref >60)
Glucose, Bld: 119 mg/dL — ABNORMAL HIGH (ref 70–99)
Potassium: 4 mmol/L (ref 3.5–5.1)
Sodium: 137 mmol/L (ref 135–145)

## 2020-12-07 LAB — MAGNESIUM: Magnesium: 2.5 mg/dL — ABNORMAL HIGH (ref 1.7–2.4)

## 2020-12-07 LAB — HEMOGLOBIN AND HEMATOCRIT, BLOOD
HCT: 42.9 % (ref 39.0–52.0)
Hemoglobin: 14.7 g/dL (ref 13.0–17.0)

## 2020-12-07 LAB — CBC
HCT: 45.1 % (ref 39.0–52.0)
Hemoglobin: 15.3 g/dL (ref 13.0–17.0)
MCH: 29.2 pg (ref 26.0–34.0)
MCHC: 33.9 g/dL (ref 30.0–36.0)
MCV: 86.1 fL (ref 80.0–100.0)
Platelets: 223 10*3/uL (ref 150–400)
RBC: 5.24 MIL/uL (ref 4.22–5.81)
RDW: 12.5 % (ref 11.5–15.5)
WBC: 12.6 10*3/uL — ABNORMAL HIGH (ref 4.0–10.5)
nRBC: 0 % (ref 0.0–0.2)

## 2020-12-07 LAB — MRSA NEXT GEN BY PCR, NASAL: MRSA by PCR Next Gen: NOT DETECTED

## 2020-12-07 LAB — LACTIC ACID, PLASMA: Lactic Acid, Venous: 1.1 mmol/L (ref 0.5–1.9)

## 2020-12-07 LAB — HIV ANTIBODY (ROUTINE TESTING W REFLEX): HIV Screen 4th Generation wRfx: NONREACTIVE

## 2020-12-07 LAB — PHOSPHORUS: Phosphorus: 4.6 mg/dL (ref 2.5–4.6)

## 2020-12-07 LAB — CK: Total CK: 360 U/L (ref 49–397)

## 2020-12-07 MED ORDER — DEXTROSE 50 % IV SOLN
INTRAVENOUS | Status: AC
  Filled 2020-12-07: qty 50

## 2020-12-07 MED ORDER — DEXTROSE 5 % IV SOLN: INTRAVENOUS | Status: DC

## 2020-12-07 MED ORDER — VITAL HIGH PROTEIN PO LIQD
1000.0000 mL | ORAL | Status: DC
  Administered 2020-12-07: 1000 mL

## 2020-12-07 MED ORDER — LACTATED RINGERS IV BOLUS
500.0000 mL | Freq: Once | INTRAVENOUS | Status: AC
  Administered 2020-12-07: 500 mL via INTRAVENOUS

## 2020-12-07 MED ORDER — FREE WATER
30.0000 mL | Status: DC
  Administered 2020-12-07 – 2020-12-08 (×6): 30 mL

## 2020-12-07 MED ORDER — DEXTROSE 50 % IV SOLN
25.0000 mL | Freq: Once | INTRAVENOUS | Status: AC
  Administered 2020-12-07: 25 mL via INTRAVENOUS

## 2020-12-07 MED ORDER — ADULT MULTIVITAMIN LIQUID CH
15.0000 mL | Freq: Every day | ORAL | Status: DC
  Filled 2020-12-07: qty 15

## 2020-12-07 MED ORDER — CHLORHEXIDINE GLUCONATE CLOTH 2 % EX PADS
6.0000 | MEDICATED_PAD | Freq: Every day | CUTANEOUS | Status: DC
  Administered 2020-12-07 – 2020-12-08 (×2): 6 via TOPICAL

## 2020-12-07 NOTE — Progress Notes (Addendum)
Pt remains on vent, per Dr. Belia Heman, Rober Skeels not attempt wean d/t drugs in system.   Pt remains on Prop @ 50 and Fent @ 150.   Pt bath completed near 1400. Pt increasingly anxious, agitated, and aggressive while awake. Lifting entire upper body off bed, +4 strength in b/l upper extremities; reaching for tube. 4mg  PRN Versed given w/ positive results. Pt resting.  Pt not following commands or tracking while awake.    Tube feeds started today. Vital HP running @ 55 ml/hr (goal) and 30 ml h20 q4.   Bair hugger placed d/t temp < 36. Most recent temp @ 1700 = 36.3   Foley remains in place; color of urine ranges from yellow, amber, red, pink, and brown. SQ heparin held d/t frank blood in urine. Pt put out over 1L urine this shift.    Turn q2. Pt in no acute distress @ this time. Will continue to monitor.

## 2020-12-07 NOTE — Progress Notes (Signed)
10/16 2315: ED nurse reported patients foley was difficult to place with no urinary output.  10/16  2323 Patient arrived to unit and Bladder scan was performed and presented with around 80cc. No urine in foley catheter that was placed in ED, and foley bag had small amount of blood.   10/17 0130: Order given by Sherrlyn Hock, NP to flush foley to check for potential clot. Foley flushed with 10cc saline and patient did grimace in response. Patient then flushed with additional 10cc saline flush and patient didn't respond this time. No urinary output thereafter.   10/17 0145: Bladder scan was performed and presented with greater that 400cc.   10/17 0215Doreen Salvage, RN started a 16 Fr Coude catheter successfully.  10/17 0230: Patient had blood in urine with 450 output and a few clots.   10/17 0325: Ultrasound of Pelvis ordered.  10/17 0348: Charline Bills, MD addendum report to CT that was originally read at 2218 on  10/16. Foley catheter is inflated within the membranous portion of the urethra, below the level of the prostate.   10/17 0350: Reported to Rust-Chester, Cecelia Byars, NP and Ultrasound of Pelvis discontinued.

## 2020-12-07 NOTE — Progress Notes (Signed)
NAME:  Bradley Casey, MRN:  [id number], DOB:  [birth year], LOS: 1 ADMISSION DATE:  12/06/2020  BRIEF SYNOPSIS 30 year old male presented to [facility] ED via EMS from his mother's home on 12/06/2020 after he jumped out of a second story building and fell onto a concrete driveway.   Per the patient's father's report, he has a history of polysubstance abuse but particularly cocaine abuse.  This evening the patient had gone over to his mother's house and was acting erratically, eventually jumping out the second story window and landing on the concrete driveway.  His father went out to assist him as EMS and the fire department were arriving.  The patient became very agitated and confused and ran away from the first responders for about 2 blocks.  The patient's father also believes there is underlying psychosis/mental health disorder in addition to substance abuse as he reports the patient goes through periods of paranoia. SEVERE AGITATION LEADING TO RESP FAILURE FROM COCAINE POISONING  Significant Hospital Events: Including procedures, antibiotic start and stop dates in addition to other pertinent events   12/06/2020-patient admitted to ICU status post jumping out of a second story window and landing on a concrete driveway, emergently intubated requiring mechanical ventilation in the ED due to severe altered mental status and agitation 10/17 remains intubated,sedated      Micro Data:  COVID NEG  Antimicrobials:   Antibiotics Given (last 72 hours)     None            Interim History / Subjective:  Remains critically ill Severe resp failure       Objective   Blood pressure 115/76, pulse 67, temperature 98 F (36.7 C), resp. rate 20, height '5\' 11"'  (1.803 m), weight 75 kg, SpO2 98 %.    Vent Mode: PRVC FiO2 (%):  [30 %-40 %] 30 % Set Rate:  [20 bmp] 20 bmp Vt Set:  [440 mL] 440 mL PEEP:  [5 cmH20] 5 cmH20 Plateau Pressure:  [12 cmH20-13 cmH20] 13 cmH20   Intake/Output Summary  (Last 24 hours) at 12/07/2020 0744 Last data filed at 12/07/2020 0500 Gross per 24 hour  Intake 618.82 ml  Output 450 ml  Net 168.82 ml   Filed Weights   12/06/20 2126 12/07/20 0123  Weight: 75 kg 75 kg      REVIEW OF SYSTEMS  PATIENT IS UNABLE TO PROVIDE COMPLETE REVIEW OF SYSTEMS DUE TO SEVERE CRITICAL ILLNESS AND TOXIC METABOLIC ENCEPHALOPATHY  ALL OTHER ROS ARE NEGATIVE   PHYSICAL EXAMINATION:  GENERAL:critically ill appearing, +resp distress EYES: Pupils equal, round, reactive to light.  No scleral icterus.  MOUTH: Moist mucosal membrane. INTUBATED NECK: Supple.  PULMONARY: +rhonchi, +wheezing CARDIOVASCULAR: S1 and S2.  No murmurs  GASTROINTESTINAL: Soft, nontender, -distended. Positive bowel sounds.  MUSCULOSKELETAL: No swelling, clubbing, or edema.  NEUROLOGIC: obtunded SKIN:intact,warm,dry    Labs/imaging that I havepersonally reviewed  (right click and "Reselect all SmartList Selections" daily)        ASSESSMENT AND PLAN SYNOPSIS  30 yo male with acute and severe toxic metabolic encephalopathy with COCAINE POISONING leading to inability to protect airway with severe resp failure  Severe ACUTE Hypoxic and Hypercapnic Respiratory Failure -continue Mechanical Ventilator support -continue Bronchodilator Therapy -Wean Fio2 and PEEP as tolerated -VAP/VENT bundle implementation -will NOT perform SAT/SBT when respiratory parameters are met  Vent Mode: PRVC FiO2 (%):  [30 %-40 %] 30 % Set Rate:  [20 bmp] 20 bmp Vt Set:  [440 mL] 440 mL PEEP:  [5  [place] Pressure:  [12 JJO84-16 cmH20] 13 cmH20   CARDIAC ICU monitoring   ACUTE KIDNEY INJURY/Renal Failure -continue Foley Catheter-assess need -Avoid nephrotoxic agents -Follow urine output, BMP -Ensure adequate renal perfusion, optimize oxygenation -Renal dose medications   Intake/Output Summary (Last 24 hours) at 12/07/2020 0744 Last data filed at 12/07/2020 0500 Gross per 24  hour  Intake 618.82 ml  Output 450 ml  Net 168.82 ml   BMP Latest Ref Rng & Units 12/07/2020 12/06/2020 08/07/2020  Glucose 70 - 99 mg/dL 119(H) 276(H) -  BUN 6 - 20 mg/dL 15 20 -  Creatinine 0.61 - 1.24 mg/dL 1.46(H) 2.44(H) 1.40(H)  Sodium 135 - 145 mmol/L 137 137 -  Potassium 3.5 - 5.1 mmol/L 4.0 4.0 -  Chloride 98 - 111 mmol/L 102 96(L) -  CO2 22 - 32 mmol/L 30 16(L) -  Calcium 8.9 - 10.3 mg/dL 8.9 9.9 -     NEUROLOGY Acute toxic metabolic encephalopathy, need for sedation Goal RASS -2 to -3 Severe COCAINE POISONING  ENDO - ICU hypoglycemic\Hyperglycemia protocol -check FSBS per protocol   GI GI PROPHYLAXIS as indicated  NUTRITIONAL STATUS DIET-->TF's as tolerated Constipation protocol as indicated   ELECTROLYTES -follow labs as needed -replace as needed -pharmacy consultation and following     Best practice (right click and "Reselect all SmartList Selections" daily)  Diet:  NPO Pain/Anxiety/Delirium protocol (if indicated): Yes (RASS goal -2) VAP protocol (if indicated): Yes DVT prophylaxis: Subcutaneous Heparin GI prophylaxis: H2B Glucose control:  SSI No Central venous access:  N/A Arterial line:  N/A Foley:still needed Mobility:  bed rest  Code Status:  FULL CODE Disposition: ICU  Labs   CBC: Recent Labs  Lab 12/06/20 2127 12/07/20 0241 12/07/20 0503  WBC 9.8  --  12.6*  HGB 17.3* 14.7 15.3  HCT 52.4* 42.9 45.1  MCV 89.0  --  86.1  PLT 302  --  606    Basic Metabolic Panel: Recent Labs  Lab 12/06/20 2127 12/07/20 0503  NA 137 137  K 4.0 4.0  CL 96* 102  CO2 16* 30  GLUCOSE 276* 119*  BUN 20 15  CREATININE 2.44* 1.46*  CALCIUM 9.9 8.9  MG 2.8* 2.5*  PHOS 6.3* 4.6   GFR: Estimated Creatinine Clearance: 79.2 mL/min (A) (by C-G formula based on SCr of 1.46 mg/dL (H)). Recent Labs  Lab 12/06/20 2127 12/07/20 0241 12/07/20 0503  WBC 9.8  --  12.6*  LATICACIDVEN >9.0* 1.1  --     Liver Function Tests: Recent Labs  Lab  12/06/20 2127  AST 86*  ALT 43  ALKPHOS 70  BILITOT 0.8  PROT 7.7  ALBUMIN 4.4   No results for input(s): LIPASE, AMYLASE in the last 168 hours. No results for input(s): AMMONIA in the last 168 hours.  ABG    Component Value Date/Time   PHART 7.42 12/06/2020 2140   PCO2ART 37 12/06/2020 2140   PO2ART 171 (H) 12/06/2020 2140   HCO3 24.0 12/06/2020 2140   ACIDBASEDEF 0.2 12/06/2020 2140   O2SAT 99.6 12/06/2020 2140     Coagulation Profile: Recent Labs  Lab 12/06/20 2127  INR 1.2    Cardiac Enzymes: Recent Labs  Lab 12/06/20 2127 12/07/20 0503  CKTOTAL  --  360  CKMB 3.9  --     HbA1C: No results found for: HGBA1C  CBG: Recent Labs  Lab 12/06/20 2243 12/07/20 0634  GLUCAP 171* 89    Allergies No Known Allergies  DVT/GI PRX  assessed I Assessed the need for Labs I Assessed the need for Foley I Assessed the need for Central Venous Line Family Discussion when available I Assessed the need for Mobilization I made an Assessment of medications to be adjusted accordingly Safety Risk assessment completed  CASE DISCUSSED IN MULTIDISCIPLINARY ROUNDS WITH ICU TEAM     Critical Care Time devoted to patient care services described in this note is 45  minutes.  Critical care was necessary to treat or prevent imminent or life-threatening deterioration.   Patient with Multiorgan failure and at high risk for cardiac arrest and death.    Corrin Parker, M.D.  [facility]  Medical Director [place] Director [facility] Cardio-Pulmonary Department

## 2020-12-07 NOTE — TOC Initial Note (Signed)
Transition of Care [facility]) - Initial/Assessment Note    Patient Details  Name: Bradley Casey MRN: [id number] Date of Birth: June 06, 1990  [facility]) CM/SW Contact:    Hetty Ely, RN Phone Number: 12/07/2020, 2:57 PM  Clinical Narrative: Patient intubated/sedated, called and spoke with father Numa Schroeter who says patient lives with mother 4-5 days during the week and with him 2 days. Patient not in PCP, [facility] [place] if needed. 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 Father states patient has used drugs now about 4 years, however this last episode may be with something different and his mother is now afraid and would like for him to get professional help. In July 2022 patient was in [facility] program in [place] however did not continue. Patient did have prescribed psychiatric medication that he did not take. Father says he will come visit this evening and speak with Doctor. Mother name is Okey Dupre, Tour manager [phone]. TOC to continue to track for discharge needs.                 Expected Discharge Plan: Home/Self Care Barriers to Discharge: Continued Medical Work up   Patient Goals and CMS Choice Patient states their goals for this hospitalization and ongoing recovery are:: To return home per Teryl Lucy [phone]      Expected Discharge Plan and Services Expected Discharge Plan: Home/Self Care       Living arrangements for the past 2 months: Single Family Home                                      Prior Living Arrangements/Services Living arrangements for the past 2 months: Single Family Home Lives with:: Parents          Need for Family Participation in Patient Care: Yes (Comment) (Patient lives with mother during the week and father on weekends.)     Criminal Activity/Legal Involvement Pertinent to Current Situation/Hospitalization: No - Comment as needed  Activities of Daily Living       Permission Sought/Granted                  Emotional Assessment   Attitude/Demeanor/Rapport: Unable to Assess (Intubated) Affect (typically observed): Unable to Assess        Admission diagnosis:  Acute delirium [R41.0] Pain [R52] Abrasions of multiple sites [T07.XXXA] Involuntary commitment [Z04.6] Drug-induced psychotic disorder, with unspecified complication [facility]) [F19.959] Patient Active Problem List   Diagnosis Date Noted   Drug-induced psychotic disorder, with unspecified complication (HCC) 12/06/2020   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 [place] [address] Phone: [phone] Fax: [phone]     Social Determinants of Health (SDOH) Interventions    Readmission Risk Interventions No flowsheet data found.

## 2020-12-07 NOTE — Progress Notes (Signed)
PHARMACY CONSULT NOTE - FOLLOW UP  Pharmacy Consult for Electrolyte Monitoring and Replacement   Recent Labs: Potassium (mmol/L)  Date Value  12/07/2020 4.0  12/20/2012 3.3 (L)   Magnesium (mg/dL)  Date Value  78/58/8502 2.5 (H)   Calcium (mg/dL)  Date Value  77/41/2878 8.9   Calcium, Total (mg/dL)  Date Value  67/67/2094 8.9   Albumin (g/dL)  Date Value  70/96/2836 4.4  12/20/2012 4.0   Phosphorus (mg/dL)  Date Value  62/94/7654 4.6   Sodium (mmol/L)  Date Value  12/07/2020 137  12/20/2012 136     Assessment: 30 year old male admitted with metabolic encephalopathy in the setting of cocaine use. Patient unable to protect airway, requiring emergent intubation in the ED. He remains in the ICU. Pharmacy consult for electrolyte management.  Goal of Therapy:  Electrolytes WNL  Plan:  --No replacement indicated today --Continue to follow along  Pricilla Riffle, PharmD, BCPS Clinical Pharmacist 12/07/2020 12:44 PM

## 2020-12-07 NOTE — Progress Notes (Signed)
Initial Nutrition Assessment  DOCUMENTATION CODES:   Not applicable  INTERVENTION:   Vital HP @55ml /hr  Propofol: 20.3 ml/hr- provides 536kcal/day  Free water flushes 34ml q4 hours to maintain tube patency   Regimen provides 1856kcal/day, 116g/day protein and 1240ml/day free water.   Liquid MVI daily via tube   Pt at refeed risk; recommend monitor potassium, magnesium and phosphorus labs daily until stable  NUTRITION DIAGNOSIS:   Inadequate oral intake related to inability to eat (pt sedated and ventilated) as evidenced by NPO status.  GOAL:   Provide needs based on ASPEN/SCCM guidelines  MONITOR:   Vent status, Labs, Weight trends, TF tolerance, Skin, I & O's  REASON FOR ASSESSMENT:   Ventilator    ASSESSMENT:   30 y/o male with h/o substance abuse who is admitted after jumping out of a second story building onto a concrete driveway now requiring intubation secondary to agitation.  Pt sedated and ventilated. OGT in place. Plan is to initiate tube feeds today. Pt is likely at refeed risk. Per chart, pt appears weight stable at baseline.   Medications reviewed and include: colace, heparin, insulin, protonix, miralax, propofol   Labs reviewed: K 4.0 wnl, creat 1.46(H), P 4.6 wnl, Mg 2.5(H) Wbc- 12.6(H)  Patient is currently intubated on ventilator support MV: 8.8 L/min Temp (24hrs), Avg:97.7 F (36.5 C), Min:96.3 F (35.7 C), Max:98.7 F (37.1 C)  Propofol: 20.3 ml/hr- provides 536kcal/day   MAP- >51mmHg  UOP- 77m   NUTRITION - FOCUSED PHYSICAL EXAM:  Flowsheet Row Most Recent Value  Orbital Region No depletion  Upper Arm Region No depletion  Thoracic and Lumbar Region No depletion  Buccal Region No depletion  Temple Region Mild depletion  Clavicle Bone Region Mild depletion  Clavicle and Acromion Bone Region Mild depletion  Scapular Bone Region No depletion  Dorsal Hand No depletion  Patellar Region Moderate depletion  Anterior Thigh Region  Moderate depletion  Posterior Calf Region Moderate depletion  Edema (RD Assessment) None  Hair Reviewed  Eyes Reviewed  Mouth Reviewed  Skin Reviewed  Nails Reviewed   Diet Order:   Diet Order             Diet NPO time specified  Diet effective now                  EDUCATION NEEDS:   No education needs have been identified at this time  Skin:  Skin Assessment: Reviewed RN Assessment  Last BM:  pta  Height:   Ht Readings from Last 1 Encounters:  12/07/20 5\' 11"  (1.803 m)    Weight:   Wt Readings from Last 1 Encounters:  12/07/20 75 kg    Ideal Body Weight:  78.18 kg  BMI:  Body mass index is 23.06 kg/m.  Estimated Nutritional Needs:   Kcal:  1926kcal/day  Protein:  105-120g/day  Fluid:  2.3-2.6L/day  MS, RD, LDN Please refer to [facility] for RD and/or RD on-call/weekend/after hours pager

## 2020-12-08 DIAGNOSIS — F19959 Other psychoactive substance use, unspecified with psychoactive substance-induced psychotic disorder, unspecified: Secondary | ICD-10-CM

## 2020-12-08 LAB — CBC
HCT: 41.9 % (ref 39.0–52.0)
Hemoglobin: 14.1 g/dL (ref 13.0–17.0)
MCH: 29.9 pg (ref 26.0–34.0)
MCHC: 33.7 g/dL (ref 30.0–36.0)
MCV: 89 fL (ref 80.0–100.0)
Platelets: 183 10*3/uL (ref 150–400)
RBC: 4.71 MIL/uL (ref 4.22–5.81)
RDW: 12.9 % (ref 11.5–15.5)
WBC: 4.1 10*3/uL (ref 4.0–10.5)
nRBC: 0 % (ref 0.0–0.2)

## 2020-12-08 LAB — BASIC METABOLIC PANEL
Anion gap: 5 (ref 5–15)
BUN: 12 mg/dL (ref 6–20)
CO2: 29 mmol/L (ref 22–32)
Calcium: 8.3 mg/dL — ABNORMAL LOW (ref 8.9–10.3)
Chloride: 105 mmol/L (ref 98–111)
Creatinine, Ser: 1.41 mg/dL — ABNORMAL HIGH (ref 0.61–1.24)
GFR, Estimated: >60 mL/min (ref >60)
Glucose, Bld: 106 mg/dL — ABNORMAL HIGH (ref 70–99)
Potassium: 3.8 mmol/L (ref 3.5–5.1)
Sodium: 139 mmol/L (ref 135–145)

## 2020-12-08 LAB — GLUCOSE, CAPILLARY
Glucose-Capillary: 103 mg/dL — ABNORMAL HIGH (ref 70–99)
Glucose-Capillary: 109 mg/dL — ABNORMAL HIGH (ref 70–99)
Glucose-Capillary: 112 mg/dL — ABNORMAL HIGH (ref 70–99)
Glucose-Capillary: 73 mg/dL (ref 70–99)
Glucose-Capillary: 92 mg/dL (ref 70–99)
Glucose-Capillary: 98 mg/dL (ref 70–99)

## 2020-12-08 LAB — PHOSPHORUS: Phosphorus: 4.4 mg/dL (ref 2.5–4.6)

## 2020-12-08 LAB — MAGNESIUM: Magnesium: 2.2 mg/dL (ref 1.7–2.4)

## 2020-12-08 MED ORDER — ENSURE ENLIVE PO LIQD
237.0000 mL | Freq: Three times a day (TID) | ORAL | Status: DC
  Administered 2020-12-08 (×2): 237 mL via ORAL

## 2020-12-08 MED ORDER — ADULT MULTIVITAMIN W/MINERALS CH: 1.0000 | ORAL_TABLET | Freq: Every day | ORAL | Status: DC

## 2020-12-08 NOTE — Progress Notes (Signed)
Nutrition Follow Up Note   DOCUMENTATION CODES:   Not applicable  INTERVENTION:   Ensure Enlive po TID, each supplement provides 350 kcal and 20 grams of protein  MVI po daily   NUTRITION DIAGNOSIS:   Inadequate oral intake related to inability to eat (pt sedated and ventilated) as evidenced by NPO status. -resolving   GOAL:   Patient will meet greater than or equal to 90% of their needs -previously met with tube feeds   MONITOR:   PO intake, Supplement acceptance, Labs, Weight trends, Skin, I & O's  ASSESSMENT:   30 y/o male with h/o substance abuse who is admitted after jumping out of a second story building onto a concrete driveway now requiring intubation secondary to agitation.  Pt extubated today. Pt awake and able to be placed on a regular diet. RD will add supplements and MVI to help pt meet his estimated needs. Psychiatry consult pending; pt currently IVC. Per chart, pt appears weight stable since admit.   Medications reviewed and include: colace, heparin, insulin, protonix, miralax  Labs reviewed: K 3.8 wnl, creat 1.41(H), P 4.4 wnl, Mg 2.2 wnl  Diet Order:   Diet Order             Diet regular Room service appropriate? Yes; Fluid consistency: Thin  Diet effective now                  EDUCATION NEEDS:   No education needs have been identified at this time  Skin:  Skin Assessment: Reviewed RN Assessment  Last BM:  pta  Height:   Ht Readings from Last 1 Encounters:  12/07/20 5' 11" (1.803 m)    Weight:   Wt Readings from Last 1 Encounters:  12/08/20 76.4 kg    Ideal Body Weight:  78.18 kg  BMI:  Body mass index is 23.49 kg/m.  Estimated Nutritional Needs:   Kcal:  2100-2400kcal/day  Protein:  105-120g/day  Fluid:  2.3-2.6L/day  Koleen Distance MS, RD, LDN Please refer to [facility] for RD and/or RD on-call/weekend/after hours pager

## 2020-12-08 NOTE — Progress Notes (Signed)
NAME:  Bradley Casey, MRN:  [id number], DOB:  March 26, 1990, LOS: 2 ADMISSION DATE:  12/06/2020  BRIEF SYNOPSIS 30 year old male presented to [facility] ED via EMS from his mother's home on 12/06/2020 after he jumped out of a second story building and fell onto a concrete driveway.   Per the patient's father's report, he has a history of polysubstance abuse but particularly cocaine abuse.  This evening the patient had gone over to his mother's house and was acting erratically, eventually jumping out the second story window and landing on the concrete driveway.  His father went out to assist him as EMS and the fire department were arriving.  The patient became very agitated and confused and ran away from the first responders for about 2 blocks.  The patient's father also believes there is underlying psychosis/mental health disorder in addition to substance abuse as he reports the patient goes through periods of paranoia. SEVERE AGITATION LEADING TO RESP FAILURE FROM COCAINE POISONING  Significant Hospital Events: Including procedures, antibiotic start and stop dates in addition to other pertinent events   12/06/2020-patient admitted to ICU status post jumping out of a second story window and landing on a concrete driveway, emergently intubated requiring mechanical ventilation in the ED due to severe altered mental status and agitation 10/17 remains intubated,sedated      Micro Data:  COVID NEG  Antimicrobials:   Antibiotics Given (last 72 hours)     None            Interim History / Subjective:  Remains on vent Plan for SA/SBT   Objective   Blood pressure 110/76, pulse 82, temperature 99.5 F (37.5 C), temperature source Oral, resp. rate 16, height _0  (1.803 m), weight 76.4 kg, SpO2 98 %.    Vent Mode: PRVC FiO2 (%):  [21 %-30 %] 21 % Set Rate:  [20 bmp] 20 bmp Vt Set:  [440 mL] 440 mL PEEP:  [5 cmH20] 5 cmH20 Plateau Pressure:  [13 cmH20-15 cmH20] 13 cmH20    Intake/Output Summary (Last 24 hours) at 12/08/2020 0803 Last data filed at 12/08/2020 0600 Gross per 24 hour  Intake 2449.03 ml  Output 1500 ml  Net 949.03 ml    Filed Weights   12/06/20 2126 12/07/20 0123 12/08/20 0500  Weight: 75 kg 75 kg 76.4 kg    REVIEW OF SYSTEMS  PATIENT IS UNABLE TO PROVIDE COMPLETE REVIEW OF SYSTEMS DUE TO SEVERE CRITICAL ILLNESS AND TOXIC METABOLIC ENCEPHALOPATHY  PHYSICAL EXAMINATION:  GENERAL:critically ill appearing, +resp distress EYES: Pupils equal, round, reactive to light.  No scleral icterus.  MOUTH: Moist mucosal membrane. INTUBATED NECK: Supple.  PULMONARY: +rhonchi, +wheezing CARDIOVASCULAR: S1 and S2.  No murmurs  GASTROINTESTINAL: Soft, nontender, -distended. Positive bowel sounds.  MUSCULOSKELETAL: No swelling, clubbing, or edema.  NEUROLOGIC: obtunded SKIN:intact,warm,dry    Labs/imaging that I havepersonally reviewed  (right click and "Reselect all SmartList Selections" daily)    ASSESSMENT AND PLAN SYNOPSIS  30 yo male with acute and severe toxic metabolic encephalopathy with COCAINE POISONING leading to inability to protect airway with severe resp failure  Severe ACUTE Hypoxic and Hypercapnic Respiratory Failure -continue Mechanical Ventilator support -continue Bronchodilator Therapy -Wean Fio2 and PEEP as tolerated -VAP/VENT bundle implementation -will perform SAT/SBT when respiratory parameters are met  Vent Mode: PRVC FiO2 (%):  [21 %-30 %] 21 % Set Rate:  [20 bmp] 20 bmp Vt Set:  [440 mL] 440 mL PEEP:  [5 cmH20] 5 cmH20 Plateau Pressure:  [13 cmH20-15 cmH20] 13  cmH20   CARDIAC ICU monitoring  ACUTE KIDNEY INJURY/Renal Failure -continue Foley Catheter-assess need -Avoid nephrotoxic agents -Follow urine output, BMP -Ensure adequate renal perfusion, optimize oxygenation -Renal dose medications   Intake/Output Summary (Last 24 hours) at 12/08/2020 0805 Last data filed at 12/08/2020 0600 Gross per 24  hour  Intake 2449.03 ml  Output 1500 ml  Net 949.03 ml   BMP Latest Ref Rng & Units 12/08/2020 12/07/2020 12/06/2020  Glucose 70 - 99 mg/dL 106(H) 119(H) 276(H)  BUN 6 - 20 mg/dL _0 Creatinine 0.61 - 1.24 mg/dL 1.41(H) 1.46(H) 2.44(H)  Sodium 135 - 145 mmol/L 139 137 137  Potassium 3.5 - 5.1 mmol/L 3.8 4.0 4.0  Chloride 98 - 111 mmol/L 105 102 96(L)  CO2 22 - 32 mmol/L 29 30 16(L)  Calcium 8.9 - 10.3 mg/dL 8.3(L) 8.9 9.9     NEUROLOGY ACUTE TOXIC METABOLIC ENCEPHALOPATHY -need for sedation -Goal RASS -2 to -3 Wake up assessment -pending Severe COCAINE POISONING  ENDO - ICU hypoglycemic\Hyperglycemia protocol -check FSBS per protocol  ELECTROLYTES -follow labs as needed -replace as needed -pharmacy consultation and following   Best practice (right click and "Reselect all SmartList Selections" daily)  Diet:  NPO Pain/Anxiety/Delirium protocol (if indicated): Yes (RASS goal -2) VAP protocol (if indicated): Yes DVT prophylaxis: Subcutaneous Heparin GI prophylaxis: H2B Glucose control:  SSI No Central venous access:  N/A Arterial line:  N/A Foley:still needed Mobility:  bed rest  Code Status:  FULL CODE Disposition: ICU  Labs   CBC: Recent Labs  Lab 12/06/20 2127 12/07/20 0241 12/07/20 0503 12/08/20 0445  WBC 9.8  --  12.6* 4.1  HGB 17.3* 14.7 15.3 14.1  HCT 52.4* 42.9 45.1 41.9  MCV 89.0  --  86.1 89.0  PLT 302  --  223 183     Basic Metabolic Panel: Recent Labs  Lab 12/06/20 2127 12/07/20 0503 12/08/20 0445  NA 137 137 139  K 4.0 4.0 3.8  CL 96* 102 105  CO2 16* 30 29  GLUCOSE 276* 119* 106*  BUN _1 CREATININE 2.44* 1.46* 1.41*  CALCIUM 9.9 8.9 8.3*  MG 2.8* 2.5* 2.2  PHOS 6.3* 4.6 4.4    GFR: Estimated Creatinine Clearance: 82.3 mL/min (A) (by C-G formula based on SCr of 1.41 mg/dL (H)). Recent Labs  Lab 12/06/20 2127 12/07/20 0241 12/07/20 0503 12/08/20 0445  WBC 9.8  --  12.6* 4.1  LATICACIDVEN >9.0* 1.1  --   --       Liver Function Tests: Recent Labs  Lab 12/06/20 2127  AST 86*  ALT 43  ALKPHOS 70  BILITOT 0.8  PROT 7.7  ALBUMIN 4.4    No results for input(s): LIPASE, AMYLASE in the last 168 hours. No results for input(s): AMMONIA in the last 168 hours.  ABG    Component Value Date/Time   PHART 7.42 12/06/2020 2140   PCO2ART 37 12/06/2020 2140   PO2ART 171 (H) 12/06/2020 2140   HCO3 24.0 12/06/2020 2140   ACIDBASEDEF 0.2 12/06/2020 2140   O2SAT 99.6 12/06/2020 2140      Coagulation Profile: Recent Labs  Lab 12/06/20 2127  INR 1.2     Cardiac Enzymes: Recent Labs  Lab 12/06/20 2127 12/07/20 0503  CKTOTAL  --  360  CKMB 3.9  --      HbA1C: No results found for: HGBA1C  CBG: Recent Labs  Lab 12/07/20 1530 12/07/20 1939 12/08/20 0001 12/08/20 0354 12/08/20 0748  GLUCAP 54*  123* 109* 73 98     Allergies No Known Allergies     DVT/GI PRX  assessed I Assessed the need for Labs I Assessed the need for Foley I Assessed the need for Central Venous Line Family Discussion when available I Assessed the need for Mobilization I made an Assessment of medications to be adjusted accordingly Safety Risk assessment completed  CASE DISCUSSED IN MULTIDISCIPLINARY ROUNDS WITH ICU TEAM     Critical Care Time devoted to patient care services described in this note is 45  minutes.  Critical care was necessary to treat /prevent imminent and life-threatening deterioration. Overall, patient is critically ill, prognosis is guarded.    Corrin Parker, M.D.  [facility]  Medical Director [place] Director [facility] Cardio-Pulmonary Department

## 2020-12-08 NOTE — Progress Notes (Signed)
Successfully extubated Can be floor care Obtain [facility] consultation

## 2020-12-08 NOTE — Consult Note (Signed)
[facility] Face-to-Face Psychiatry Consult   Reason for Consult: Consult for 30 year old man brought to the hospital after jumping out of a second floor window at his home Referring Physician:  Kasa Patient Identification: Bradley Casey MRN:  [id number] Principal Diagnosis: Drug-induced psychotic disorder, with unspecified complication (HCC) Diagnosis:  Principal Problem:   Drug-induced psychotic disorder, with unspecified complication (HCC) Active Problems:   Polysubstance abuse (HCC)   Acute respiratory failure (HCC)   Endotracheally intubated   On mechanically assisted ventilation (HCC)   AKI (acute kidney injury) (HCC)   Increased anion gap metabolic acidosis   Lactic acidosis   Total Time spent with patient: 1 hour  Subjective:   Bradley Casey is a 30 y.o. male patient admitted with "what are you talking about?".  HPI: 30 year old man brought to the hospital after jumping out of a second story window at his home.  Parents were there at the time.  EMS called.  Amazingly patient appears not to have broken anything or suffered any very severe injuries.  Father is reported to have mentioned on the scene that the patient has a history of psychosis when he is on cocaine.  Not clearly documented that there was any actual expression of psychosis from the patient and this episode.  Patient seen in the intensive care unit.  He was awake and alert.  Not very cooperative with talking with me.  Answered most questions with vagueness or a claimed to have no memories or cannot understand what we are even talking about.  When I was very explicit about wanting to know what was in his mind and what happened that made him jump out of the window patient shrugged and did not give any answer.  Drug screen positive for cocaine  Past Psychiatric History: Patient has a past history of multiple presentations with psychotic symptoms in the context of drug abuse.  He was admitted to the psychiatric ward 2 years ago for  clearly psychotic thinking in the context of drug abuse.  He has had other occasions to be seen in the emergency room with similar problems subsequently.  Never very clearly determined whether his psychosis only arises as part of drug use or is another underlying psychiatric illness.  No known history of suicide attempts.  Risk to Self:   Risk to Others:   Prior Inpatient Therapy:   Prior Outpatient Therapy:    Past Medical History: History reviewed. No pertinent past medical history. History reviewed. No pertinent surgical history. Family History: History reviewed. No pertinent family history. Family Psychiatric  History: None reported Social History:  Social History   Substance and Sexual Activity  Alcohol Use Yes     Social History   Substance and Sexual Activity  Drug Use Yes   Types: Marijuana, Cocaine    Social History   Socioeconomic History   Marital status: Single    Spouse name: Not on file   Number of children: Not on file   Years of education: Not on file   Highest education level: Not on file  Occupational History   Not on file  Tobacco Use   Smoking status: Some Days   Smokeless tobacco: Never  Substance and Sexual Activity   Alcohol use: Yes   Drug use: Yes    Types: Marijuana, Cocaine   Sexual activity: Not on file  Other Topics Concern   Not on file  Social History Narrative   Not on file   Social Determinants of Health   Financial Resource  Strain: Not on file  Food Insecurity: Not on file  Transportation Needs: Not on file  Physical Activity: Not on file  Stress: Not on file  Social Connections: Not on file   Additional Social History:    Allergies:  No Known Allergies  Labs:  Results for orders placed or performed during the hospital encounter of 12/06/20 (from the past 48 hour(s))  Resp Panel by RT-PCR (Flu A&B, Covid) Nasopharyngeal Swab     Status: None   Collection Time: 12/06/20  9:27 PM   Specimen: Nasopharyngeal Swab;  Nasopharyngeal(NP) swabs in vial transport medium  Result Value Ref Range   SARS Coronavirus 2 by RT PCR NEGATIVE NEGATIVE    Comment: (NOTE) SARS-CoV-2 target nucleic acids are NOT DETECTED.  The SARS-CoV-2 RNA is generally detectable in upper respiratory specimens during the acute phase of infection. The lowest concentration of SARS-CoV-2 viral copies this assay can detect is 138 copies/mL. A negative result does not preclude SARS-Cov-2 infection and should not be used as the sole basis for treatment or other patient management decisions. A negative result may occur with  improper specimen collection/handling, submission of specimen other than nasopharyngeal swab, presence of viral mutation(s) within the areas targeted by this assay, and inadequate number of viral copies(<138 copies/mL). A negative result must be combined with clinical observations, patient history, and epidemiological information. The expected result is Negative.  Fact Sheet for Patients:  BloggerCourse.com  Fact Sheet for Healthcare Providers:  SeriousBroker.it  This test is no t yet approved or cleared by the [place] FDA and  has been authorized for detection and/or diagnosis of SARS-CoV-2 by FDA under an Emergency Use Authorization (EUA). This EUA will remain  in effect (meaning this test can be used) for the duration of the COVID-19 declaration under Section 564(b)(1) of the Act, 21 U.S.C.section 360bbb-3(b)(1), unless the authorization is terminated  or revoked sooner.       Influenza A by PCR NEGATIVE NEGATIVE   Influenza B by PCR NEGATIVE NEGATIVE    Comment: (NOTE) The Xpert Xpress SARS-CoV-2/FLU/RSV plus assay is intended as an aid in the diagnosis of influenza from Nasopharyngeal swab specimens and should not be used as a sole basis for treatment. Nasal washings and aspirates are unacceptable for Xpert Xpress  SARS-CoV-2/FLU/RSV testing.  Fact Sheet for Patients: BloggerCourse.com  Fact Sheet for Healthcare Providers: SeriousBroker.it  This test is not yet approved or cleared by the [place] FDA and has been authorized for detection and/or diagnosis of SARS-CoV-2 by FDA under an Emergency Use Authorization (EUA). This EUA will remain in effect (meaning this test can be used) for the duration of the COVID-19 declaration under Section 564(b)(1) of the Act, 21 U.S.C. section 360bbb-3(b)(1), unless the authorization is terminated or revoked.  Performed at [facility], [address] [place], [place] [address]   Comprehensive metabolic panel     Status: Abnormal   Collection Time: 12/06/20  9:27 PM  Result Value Ref Range   Sodium 137 135 - 145 mmol/L    Comment: ELECTROLYTES REPEATED TO VERIFY GAA   Potassium 4.0 3.5 - 5.1 mmol/L   Chloride 96 (L) 98 - 111 mmol/L   CO2 16 (L) 22 - 32 mmol/L   Glucose, Bld 276 (H) 70 - 99 mg/dL    Comment: Glucose reference range applies only to samples taken after fasting for at least 8 hours.   BUN 20 6 - 20 mg/dL   Creatinine, Ser 9.02 (H) 0.61 - 1.24  mg/dL   Calcium 9.9 8.9 - 16.1 mg/dL   Total Protein 7.7 6.5 - 8.1 g/dL   Albumin 4.4 3.5 - 5.0 g/dL   AST 86 (H) 15 - 41 U/L   ALT 43 0 - 44 U/L   Alkaline Phosphatase 70 38 - 126 U/L   Total Bilirubin 0.8 0.3 - 1.2 mg/dL   GFR, Estimated 36 (L) >60 mL/min    Comment: (NOTE) Calculated using the CKD-EPI Creatinine Equation (2021)    Anion gap 25 (H) 5 - 15    Comment: Performed at [facility], [address] [place], [place] [address]  CBC     Status: Abnormal   Collection Time: 12/06/20  9:27 PM  Result Value Ref Range   WBC 9.8 4.0 - 10.5 K/uL   RBC 5.89 (H) 4.22 - 5.81 MIL/uL   Hemoglobin 17.3 (H) 13.0 - 17.0 g/dL   HCT 54.0 (H) 98.1 - 19.1 %   MCV 89.0 80.0 - 100.0 fL   MCH 29.4 26.0 - 34.0 pg   MCHC 33.0  30.0 - 36.0 g/dL   RDW 47.8 29.5 - 62.1 %   Platelets 302 150 - 400 K/uL   nRBC 0.0 0.0 - 0.2 %    Comment: Performed at [facility], [address] [place], [place] [address]  Ethanol     Status: None   Collection Time: 12/06/20  9:27 PM  Result Value Ref Range   Alcohol, Ethyl (B) <10 <10 mg/dL    Comment: (NOTE) Lowest detectable limit for serum alcohol is 10 mg/dL.  For medical purposes only. Performed at [facility], [address] [place], [place] [address]   Lactic acid, plasma     Status: Abnormal   Collection Time: 12/06/20  9:27 PM  Result Value Ref Range   Lactic Acid, Venous >9.0 (HH) 0.5 - 1.9 mmol/L    Comment: CRITICAL RESULT CALLED TO, READ BACK BY AND VERIFIED WITH Terie Purser RN AT 1037 ON 12/06/20 GAA Performed at [facility], [address] [place], [place] [address]   Protime-INR     Status: None   Collection Time: 12/06/20  9:27 PM  Result Value Ref Range   Prothrombin Time 14.9 11.4 - 15.2 seconds   INR 1.2 0.8 - 1.2    Comment: (NOTE) INR goal varies based on device and disease states. Performed at [facility], [address] [place], [place] [address]   CKMB     Status: None   Collection Time: 12/06/20  9:27 PM  Result Value Ref Range   CK, MB 3.9 0.5 - 5.0 ng/mL    Comment: Performed at [facility] [place], [address] [place], [place] [address]  Type and screen [facility]     Status: None   Collection Time: 12/06/20  9:27 PM  Result Value Ref Range   ABO/RH(D) A POS    Antibody Screen NEG    Sample Expiration      12/09/2020,2359 Performed at [facility], [address] [place], [place] [address]   Salicylate level     Status: Abnormal   Collection Time: 12/06/20  9:27 PM  Result Value Ref Range   Salicylate Lvl <7.0 (L) 7.0 - 30.0 mg/dL    Comment: Performed at [facility], [address] [place], [place] [address]  Acetaminophen level      Status: Abnormal   Collection Time: 12/06/20  9:27 PM  Result Value Ref  Range   Acetaminophen (Tylenol), Serum <10 (L) 10 - 30 ug/mL    Comment: (NOTE) Therapeutic concentrations vary significantly. A range of 10-30 ug/mL  may be an effective concentration for many patients. However, some  are best treated at concentrations outside of this range. Acetaminophen concentrations >150 ug/mL at 4 hours after ingestion  and >50 ug/mL at 12 hours after ingestion are often associated with  toxic reactions.  Performed at [facility], [address], [place], [place] [address]   Magnesium     Status: Abnormal   Collection Time: 12/06/20  9:27 PM  Result Value Ref Range   Magnesium 2.8 (H) 1.7 - 2.4 mg/dL    Comment: Performed at [facility], [address] [place], [place] [address]  Phosphorus     Status: Abnormal   Collection Time: 12/06/20  9:27 PM  Result Value Ref Range   Phosphorus 6.3 (H) 2.5 - 4.6 mg/dL    Comment: Performed at [facility], [address] [place], [place] [address]  Troponin I (High Sensitivity)     Status: None   Collection Time: 12/06/20  9:27 PM  Result Value Ref Range   Troponin I (High Sensitivity) 8 <18 ng/L    Comment: (NOTE) Elevated high sensitivity troponin I (hsTnI) values and significant  changes across serial measurements may suggest ACS but many other  chronic and acute conditions are known to elevate hsTnI results.  Refer to the "Links" section for chest pain algorithms and additional  guidance. Performed at [facility], [address] [place], [place] [address]   Blood gas, arterial     Status: Abnormal   Collection Time: 12/06/20  9:40 PM  Result Value Ref Range   FIO2 40.00    Delivery systems VENTILATOR    Mode ASSIST CONTROL    VT 440 mL   LHR 20 resp/min   Peep/cpap 5.0 cm H20   pH, Arterial 7.42 7.350 - 7.450   pCO2 arterial 37 32.0 - 48.0 mmHg   pO2, Arterial 171 (H) 83.0 - 108.0  mmHg   Bicarbonate 24.0 20.0 - 28.0 mmol/L   Acid-base deficit 0.2 0.0 - 2.0 mmol/L   O2 Saturation 99.6 %   Patient temperature 37.0    Collection site REVIEWED BY    Sample type ARTERIAL DRAW     Comment: Performed at [facility], [address] [place], [place] [address]  POC CBG, ED     Status: Abnormal   Collection Time: 12/06/20 10:43 PM  Result Value Ref Range   Glucose-Capillary 171 (H) 70 - 99 mg/dL    Comment: Glucose reference range applies only to samples taken after fasting for at least 8 hours.  MRSA Next Gen by PCR, Nasal     Status: None   Collection Time: 12/07/20 12:27 AM   Specimen: Nasal Mucosa; Nasal Swab  Result Value Ref Range   MRSA by PCR Next Gen NOT DETECTED NOT DETECTED    Comment: (NOTE) The GeneXpert MRSA Assay (FDA approved for NASAL specimens only), is one component of a comprehensive MRSA colonization surveillance program. It is not intended to diagnose MRSA infection nor to guide or monitor treatment for MRSA infections. Test performance is not FDA approved in patients less than 52 years old. Performed at [facility], [address] [place], [place] [address]   Urinalysis, Complete w Microscopic Nasal Mucosa     Status: Abnormal   Collection Time: 12/07/20  2:20 AM  Result Value Ref Range   Color, Urine RED (A) YELLOW   APPearance BLOODY (A) CLEAR   Specific Gravity, Urine 1.048 (H) 1.005 - 1.030   pH  5.0 - 8.0    TEST NOT REPORTED DUE TO COLOR INTERFERENCE OF URINE PIGMENT   Glucose, UA (A) NEGATIVE mg/dL    TEST NOT REPORTED DUE TO COLOR INTERFERENCE OF URINE PIGMENT   Hgb urine dipstick (A) NEGATIVE    TEST NOT REPORTED DUE TO COLOR INTERFERENCE OF URINE PIGMENT   Bilirubin Urine (A) NEGATIVE    TEST NOT REPORTED DUE TO COLOR INTERFERENCE OF URINE PIGMENT   Ketones, ur (A) NEGATIVE mg/dL    TEST NOT REPORTED DUE TO COLOR INTERFERENCE OF URINE PIGMENT   Protein, ur (A) NEGATIVE mg/dL    TEST NOT REPORTED DUE TO  COLOR INTERFERENCE OF URINE PIGMENT   Nitrite (A) NEGATIVE    TEST NOT REPORTED DUE TO COLOR INTERFERENCE OF URINE PIGMENT   Leukocytes,Ua (A) NEGATIVE    TEST NOT REPORTED DUE TO COLOR INTERFERENCE OF URINE PIGMENT    Comment: Performed at [facility], [address], [place], [place] [address]  Urine Drug Screen, Qualitative     Status: Abnormal   Collection Time: 12/07/20  2:22 AM  Result Value Ref Range   Tricyclic, Ur Screen NONE DETECTED NONE DETECTED   Amphetamines, Ur Screen NONE DETECTED NONE DETECTED   MDMA (Ecstasy)Ur Screen NONE DETECTED NONE DETECTED   Cocaine Metabolite,Ur [place] POSITIVE (A) NONE DETECTED   Opiate, Ur Screen NONE DETECTED NONE DETECTED   Phencyclidine (PCP) Ur S NONE DETECTED NONE DETECTED   Cannabinoid 50 Ng, Ur [place] POSITIVE (A) NONE DETECTED   Barbiturates, Ur Screen NONE DETECTED NONE DETECTED   Benzodiazepine, Ur Scrn POSITIVE (A) NONE DETECTED   Methadone Scn, Ur NONE DETECTED NONE DETECTED    Comment: (NOTE) Tricyclics + metabolites, urine    Cutoff 1000 ng/mL Amphetamines + metabolites, urine  Cutoff 1000 ng/mL MDMA (Ecstasy), urine              Cutoff 500 ng/mL Cocaine Metabolite, urine          Cutoff 300 ng/mL Opiate + metabolites, urine        Cutoff 300 ng/mL Phencyclidine (PCP), urine         Cutoff 25 ng/mL Cannabinoid, urine                 Cutoff 50 ng/mL Barbiturates + metabolites, urine  Cutoff 200 ng/mL Benzodiazepine, urine              Cutoff 200 ng/mL Methadone, urine                   Cutoff 300 ng/mL  The urine drug screen provides only a preliminary, unconfirmed analytical test result and should not be used for non-medical purposes. Clinical consideration and professional judgment should be applied to any positive drug screen result due to possible interfering substances. A more specific alternate chemical method must be used in order to obtain a confirmed analytical result. Gas chromatography / mass spectrometry  (GC/MS) is the preferred confirm atory method. Performed at [facility], [address] [place], [place] [address]   Lactic acid, plasma     Status: None   Collection Time: 12/07/20  2:41 AM  Result Value Ref Range   Lactic Acid, Venous 1.1 0.5 - 1.9 mmol/L    Comment: Performed at [facility], [address]  [place], [place] [address]  Hemoglobin and hematocrit, blood     Status: None   Collection Time: 12/07/20  2:41 AM  Result Value Ref Range   Hemoglobin 14.7 13.0 - 17.0 g/dL   HCT 25.9 56.3 - 87.5 %    Comment: Performed at [facility], [address] [place], [place] [address]  Basic metabolic panel     Status: Abnormal   Collection Time: 12/07/20  5:03 AM  Result Value Ref Range   Sodium 137 135 - 145 mmol/L   Potassium 4.0 3.5 - 5.1 mmol/L   Chloride 102 98 - 111 mmol/L   CO2 30 22 - 32 mmol/L   Glucose, Bld 119 (H) 70 - 99 mg/dL    Comment: Glucose reference range applies only to samples taken after fasting for at least 8 hours.   BUN 15 6 - 20 mg/dL   Creatinine, Ser 9.51 (H) 0.61 - 1.24 mg/dL   Calcium 8.9 8.9 - 88.4 mg/dL   GFR, Estimated >16 >60 mL/min    Comment: (NOTE) Calculated using the CKD-EPI Creatinine Equation (2021)    Anion gap 5 5 - 15    Comment: Performed at [facility], [address] [place], [place] [address]  CBC     Status: Abnormal   Collection Time: 12/07/20  5:03 AM  Result Value Ref Range   WBC 12.6 (H) 4.0 - 10.5 K/uL   RBC 5.24 4.22 - 5.81 MIL/uL   Hemoglobin 15.3 13.0 - 17.0 g/dL   HCT 01.0 93.2 - 35.5 %   MCV 86.1 80.0 - 100.0 fL   MCH 29.2 26.0 - 34.0 pg   MCHC 33.9 30.0 - 36.0 g/dL   RDW 73.2 20.2 - 54.2 %   Platelets 223 150 - 400 K/uL   nRBC 0.0 0.0 - 0.2 %    Comment: Performed at [facility], [address], [place], [place] [address]  Magnesium     Status: Abnormal   Collection Time: 12/07/20  5:03 AM  Result Value Ref Range   Magnesium 2.5 (H) 1.7 - 2.4 mg/dL     Comment: Performed at [facility], [address] [place], [place] [address]  Phosphorus     Status: None   Collection Time: 12/07/20  5:03 AM  Result Value Ref Range   Phosphorus 4.6 2.5 - 4.6 mg/dL    Comment: Performed at [facility], [address] [place], [place] [address]  HIV Antibody (routine testing w rflx)     Status: None   Collection Time: 12/07/20  5:03 AM  Result Value Ref Range   HIV Screen 4th Generation wRfx Non Reactive Non Reactive    Comment: Performed at [facility], [address], [place], [place] [address]  CK     Status: None   Collection Time: 12/07/20  5:03 AM  Result Value Ref Range   Total CK 360 49 - 397 U/L    Comment: Performed at [facility], [address] [place], [place] [address]  Glucose, capillary     Status: None   Collection Time: 12/07/20  6:34 AM  Result Value Ref Range   Glucose-Capillary 89 70 - 99 mg/dL    Comment: Glucose reference range applies only to samples taken after fasting for at least 8 hours.  Glucose, capillary     Status: Abnormal   Collection Time: 12/07/20 11:14 AM  Result Value Ref Range   Glucose-Capillary 56 (L) 70 -  99 mg/dL    Comment: Glucose reference range applies only to samples taken after fasting for at least 8 hours.  Glucose, capillary     Status: None   Collection Time: 12/07/20 11:50 AM  Result Value Ref Range   Glucose-Capillary 77 70 - 99 mg/dL    Comment: Glucose reference range applies only to samples taken after fasting for at least 8 hours.  Glucose, capillary     Status: Abnormal   Collection Time: 12/07/20  3:30 PM  Result Value Ref Range   Glucose-Capillary 54 (L) 70 - 99 mg/dL    Comment: Glucose reference range applies only to samples taken after fasting for at least 8 hours.  Glucose, capillary     Status: Abnormal   Collection Time: 12/07/20  7:39 PM  Result Value Ref Range   Glucose-Capillary 123 (H) 70 - 99 mg/dL    Comment: Glucose  reference range applies only to samples taken after fasting for at least 8 hours.  Glucose, capillary     Status: Abnormal   Collection Time: 12/08/20 12:01 AM  Result Value Ref Range   Glucose-Capillary 109 (H) 70 - 99 mg/dL    Comment: Glucose reference range applies only to samples taken after fasting for at least 8 hours.  Glucose, capillary     Status: None   Collection Time: 12/08/20  3:54 AM  Result Value Ref Range   Glucose-Capillary 73 70 - 99 mg/dL    Comment: Glucose reference range applies only to samples taken after fasting for at least 8 hours.  CBC     Status: None   Collection Time: 12/08/20  4:45 AM  Result Value Ref Range   WBC 4.1 4.0 - 10.5 K/uL   RBC 4.71 4.22 - 5.81 MIL/uL   Hemoglobin 14.1 13.0 - 17.0 g/dL   HCT 16.1 09.6 - 04.5 %   MCV 89.0 80.0 - 100.0 fL   MCH 29.9 26.0 - 34.0 pg   MCHC 33.7 30.0 - 36.0 g/dL   RDW 40.9 81.1 - 91.4 %   Platelets 183 150 - 400 K/uL   nRBC 0.0 0.0 - 0.2 %    Comment: Performed at [facility], [address], [place], [place] [address]  Basic metabolic panel     Status: Abnormal   Collection Time: 12/08/20  4:45 AM  Result Value Ref Range   Sodium 139 135 - 145 mmol/L   Potassium 3.8 3.5 - 5.1 mmol/L   Chloride 105 98 - 111 mmol/L   CO2 29 22 - 32 mmol/L   Glucose, Bld 106 (H) 70 - 99 mg/dL    Comment: Glucose reference range applies only to samples taken after fasting for at least 8 hours.   BUN 12 6 - 20 mg/dL   Creatinine, Ser 6.21 (H) 0.61 - 1.24 mg/dL   Calcium 8.3 (L) 8.9 - 10.3 mg/dL   GFR, Estimated >30 >86 mL/min    Comment: (NOTE) Calculated using the CKD-EPI Creatinine Equation (2021)    Anion gap 5 5 - 15    Comment: Performed at [facility], [address] [place], [place] [address]  Magnesium     Status: None   Collection Time: 12/08/20  4:45 AM  Result Value Ref Range   Magnesium 2.2 1.7 - 2.4 mg/dL    Comment: Performed at [facility], [address]  [place], [place] [address]  Phosphorus     Status: None   Collection Time: 12/08/20  4:45 AM  Result Value Ref Range   Phosphorus 4.4 2.5 - 4.6 mg/dL    Comment: Performed at [facility], [address] [place], [place] [address]  Glucose, capillary     Status: None   Collection Time: 12/08/20  7:48 AM  Result Value Ref Range   Glucose-Capillary 98 70 - 99 mg/dL    Comment: Glucose reference range applies only to samples taken after fasting for at least 8 hours.  Glucose, capillary     Status: None   Collection Time: 12/08/20  3:24 PM  Result Value Ref Range   Glucose-Capillary 92 70 - 99 mg/dL    Comment: Glucose reference range applies only to samples taken after fasting for at least 8 hours.    Current Facility-Administered Medications  Medication Dose Route Frequency Provider Last Rate Last Admin   albuterol (PROVENTIL) (2.5 MG/3ML) 0.083% nebulizer solution 2.5 mg  2.5 mg Nebulization Q4H PRN Rust-Chester, Cecelia Byars, NP       Chlorhexidine Gluconate Cloth 2 % PADS 6 each  6 each Topical Daily Erin Fulling, MD   6 each at 12/08/20 0953   docusate (COLACE) 50 MG/5ML liquid 100 mg  100 mg Per Tube BID PRN Rust-Chester, Cecelia Byars, NP       docusate (COLACE) 50 MG/5ML liquid 100 mg  100 mg Per Tube BID Rust-Chester, Cecelia Byars, NP   100 mg at 12/07/20 2157   feeding supplement (ENSURE ENLIVE / ENSURE PLUS) liquid 237 mL  237 mL Oral TID BM Erin Fulling, MD   237 mL at 12/08/20 1443   heparin injection 5,000 Units  5,000 Units Subcutaneous Q8H Rust-Chester, Britton L, NP   5,000 Units at 12/08/20 1443   insulin aspart (novoLOG) injection 0-15 Units  0-15 Units Subcutaneous Q4H Rust-Chester, Cecelia Byars, NP       [START ON 12/09/2020] multivitamin with minerals tablet 1 tablet  1 tablet Oral Daily Kasa, Kurian, MD       ondansetron (ZOFRAN) injection 4 mg  4 mg Intravenous Q6H PRN Rust-Chester, Micheline Rough L, NP       pantoprazole (PROTONIX) injection 40 mg  40 mg Intravenous QHS  Rust-Chester, Britton L, NP   40 mg at 12/07/20 2157   polyethylene glycol (MIRALAX / GLYCOLAX) packet 17 g  17 g Per Tube Daily PRN Rust-Chester, Micheline Rough L, NP       polyethylene glycol (MIRALAX / GLYCOLAX) packet 17 g  17 g Per Tube Daily Rust-Chester, Britton L, NP   17 g at 12/07/20 1035    Musculoskeletal: Strength & Muscle Tone: within normal limits Gait & Station: unsteady Patient leans: N/A            Psychiatric Specialty Exam:  Presentation  General Appearance: Appropriate for Environment  Eye Contact:Good  Speech:Clear and Coherent  Speech Volume:Normal  Handedness:Right   Mood and Affect  Mood:Anxious; Depressed  Affect:Blunt; Congruent   Thought Process  Thought Processes:Coherent  Descriptions of Associations:Intact  Orientation:Full (Time, Place and Person)  Thought Content:Logical  History of Schizophrenia/Schizoaffective disorder:No  Duration of Psychotic Symptoms:Greater than six months  Hallucinations:No data recorded Ideas of Reference:Paranoia  Suicidal Thoughts:No data recorded Homicidal Thoughts:No data recorded  Sensorium  Memory:Immediate Fair; Recent Fair; Remote Fair  Judgment:Fair  Insight:Fair   Executive Functions  Concentration:Fair  Attention Span:Fair  Recall:Fair  Fund of Knowledge:Fair  Language:Fair   Psychomotor Activity  Psychomotor Activity: No data recorded  Assets  Assets:Communication Skills; Desire for Improvement; Intimacy; Leisure Time; Resilience;  Social Support   Sleep  Sleep: No data recorded  Physical Exam: Physical Exam Vitals and nursing note reviewed.  Constitutional:      Appearance: Normal appearance.  HENT:     Head: Normocephalic and atraumatic.     Mouth/Throat:     Pharynx: Oropharynx is clear.  Eyes:     Pupils: Pupils are equal, round, and reactive to light.  Cardiovascular:     Rate and Rhythm: Normal rate and regular rhythm.  Pulmonary:     Effort:  Pulmonary effort is normal.     Breath sounds: Normal breath sounds.  Abdominal:     General: Abdomen is flat.     Palpations: Abdomen is soft.  Musculoskeletal:        General: Normal range of motion.  Skin:    General: Skin is warm and dry.  Neurological:     General: No focal deficit present.     Mental Status: He is alert. Mental status is at baseline.  Psychiatric:        Attention and Perception: He is inattentive.        Mood and Affect: Mood normal. Affect is blunt and inappropriate.        Speech: Speech is delayed.        Behavior: Behavior is slowed and withdrawn.        Cognition and Memory: Memory is impaired.   Review of Systems  Constitutional: Negative.   HENT: Negative.    Eyes: Negative.   Respiratory: Negative.    Cardiovascular: Negative.   Gastrointestinal: Negative.   Musculoskeletal:  Positive for myalgias.  Skin: Negative.   Neurological: Negative.   Blood pressure 134/81, pulse 94, temperature 99.5 F (37.5 C), temperature source Oral, resp. rate 15, height  (1.803 m), weight 76.4 kg, SpO2 95 %. Body mass index is 23.49 kg/m.  Treatment Plan Summary: Plan 30 year old man who jumped out of a window for no logical reason at his home.  Currently declining to engage in any kind of meaningful conversation about the situation.  Evidence of cocaine use.  Patient has been calm and cooperative for the most part in the hospital.  It would be useful if it were possible at some point to determine whether this gentleman's problems are only related to drug abuse or not but I am not sure we are ever going to determine that.  At the moment however I think he is still high risk for impulsive or dangerous behavior in the hospital and would recommend leaving the sitter and IVC in place.  I will repeat attempt at examination tomorrow.  Disposition:  see note  Mordecai Rasmussen, MD 12/08/2020 3:32 PM

## 2020-12-08 NOTE — Procedures (Signed)
Extubation Procedure Note  Patient Details:   Name: Bradley Casey DOB: 1990-07-28 MRN: [id number]   Airway Documentation:    Vent end date: 12/08/20 Vent end time: 0818   Evaluation  O2 sats: stable throughout Complications: No apparent complications Patient did tolerate procedure well. Bilateral Breath Sounds: Clear   Yes.  Extubated to room air per MD order.  Able to cough and speak.  Cuff leak present prior to extubation.  [name] 12/08/2020, 8:21 AM

## 2020-12-08 NOTE — Progress Notes (Signed)
PHARMACY CONSULT NOTE - FOLLOW UP  Pharmacy Consult for Electrolyte Monitoring and Replacement   Recent Labs: Potassium (mmol/L)  Date Value  12/08/2020 3.8  12/20/2012 3.3 (L)   Magnesium (mg/dL)  Date Value  82/07/154 2.2   Calcium (mg/dL)  Date Value  15/37/9432 8.3 (L)   Calcium, Total (mg/dL)  Date Value  76/14/7092 8.9   Albumin (g/dL)  Date Value  95/74/7340 4.4  12/20/2012 4.0   Phosphorus (mg/dL)  Date Value  37/10/6436 4.4   Sodium (mmol/L)  Date Value  12/08/2020 139  12/20/2012 136     Assessment: 30 year old male admitted with metabolic encephalopathy in the setting of cocaine use. Patient unable to protect airway, requiring emergent intubation in the ED. He remains in the ICU. Extubated 10/18. Pharmacy consult for electrolyte management.  Goal of Therapy:  Electrolytes WNL  Plan:  --No replacement indicated today --Continue to follow along  Pricilla Riffle, PharmD, BCPS Clinical Pharmacist 12/08/2020 1:34 PM

## 2020-12-08 NOTE — TOC Progression Note (Signed)
Transition of Care [facility]) - Progression Note    Patient Details  Name: Bradley Casey MRN: [id number] Date of Birth: Mar 21, 1990  Transition of Care [facility]) CM/SW Contact  Marina Goodell Phone Number: [phone] 12/08/2020, 8:47 AM  Clinical Narrative:     Patient is currently intubated/sedated and under IVC.  TOC will continue to follow and will assist with discharge needs if IVC is rescinded and patient does not meet inpatient psychiatric criteria.   Expected Discharge Plan: Home/Self Care Barriers to Discharge: Continued Medical Work up  Expected Discharge Plan and Services Expected Discharge Plan: Home/Self Care       Living arrangements for the past 2 months: Single Family Home                                       Social Determinants of Health (SDOH) Interventions    Readmission Risk Interventions No flowsheet data found.

## 2020-12-09 ENCOUNTER — Other Ambulatory Visit: Payer: Self-pay

## 2020-12-09 DIAGNOSIS — E872 Acidosis, unspecified: Secondary | ICD-10-CM

## 2020-12-09 DIAGNOSIS — F1910 Other psychoactive substance abuse, uncomplicated: Secondary | ICD-10-CM

## 2020-12-09 DIAGNOSIS — J9602 Acute respiratory failure with hypercapnia: Secondary | ICD-10-CM

## 2020-12-09 DIAGNOSIS — N179 Acute kidney failure, unspecified: Secondary | ICD-10-CM

## 2020-12-09 LAB — COMPREHENSIVE METABOLIC PANEL
ALT: 37 U/L (ref 0–44)
AST: 90 U/L — ABNORMAL HIGH (ref 15–41)
Albumin: 3.6 g/dL (ref 3.5–5.0)
Alkaline Phosphatase: 62 U/L (ref 38–126)
Anion gap: 8 (ref 5–15)
BUN: 9 mg/dL (ref 6–20)
CO2: 28 mmol/L (ref 22–32)
Calcium: 9.1 mg/dL (ref 8.9–10.3)
Chloride: 104 mmol/L (ref 98–111)
Creatinine, Ser: 1.07 mg/dL (ref 0.61–1.24)
GFR, Estimated: >60 mL/min (ref >60)
Glucose, Bld: 94 mg/dL (ref 70–99)
Potassium: 4 mmol/L (ref 3.5–5.1)
Sodium: 140 mmol/L (ref 135–145)
Total Bilirubin: 0.7 mg/dL (ref 0.3–1.2)
Total Protein: 6.8 g/dL (ref 6.5–8.1)

## 2020-12-09 LAB — CBC WITH DIFFERENTIAL/PLATELET
Abs Immature Granulocytes: 0.02 10*3/uL (ref 0.00–0.07)
Basophils Absolute: 0 10*3/uL (ref 0.0–0.1)
Basophils Relative: 0 %
Eosinophils Absolute: 0 10*3/uL (ref 0.0–0.5)
Eosinophils Relative: 1 %
HCT: 45.9 % (ref 39.0–52.0)
Hemoglobin: 15.7 g/dL (ref 13.0–17.0)
Immature Granulocytes: 0 %
Lymphocytes Relative: 23 %
Lymphs Abs: 1.2 10*3/uL (ref 0.7–4.0)
MCH: 30.2 pg (ref 26.0–34.0)
MCHC: 34.2 g/dL (ref 30.0–36.0)
MCV: 88.3 fL (ref 80.0–100.0)
Monocytes Absolute: 0.6 10*3/uL (ref 0.1–1.0)
Monocytes Relative: 11 %
Neutro Abs: 3.4 10*3/uL (ref 1.7–7.7)
Neutrophils Relative %: 65 %
Platelets: 189 10*3/uL (ref 150–400)
RBC: 5.2 MIL/uL (ref 4.22–5.81)
RDW: 12.8 % (ref 11.5–15.5)
WBC: 5.3 10*3/uL (ref 4.0–10.5)
nRBC: 0 % (ref 0.0–0.2)

## 2020-12-09 LAB — MAGNESIUM: Magnesium: 2 mg/dL (ref 1.7–2.4)

## 2020-12-09 LAB — GLUCOSE, CAPILLARY
Glucose-Capillary: 98 mg/dL (ref 70–99)
Glucose-Capillary: 93 mg/dL (ref 70–99)

## 2020-12-09 LAB — PHOSPHORUS: Phosphorus: 2.9 mg/dL (ref 2.5–4.6)

## 2020-12-09 MED ORDER — ENSURE ENLIVE PO LIQD
237.0000 mL | Freq: Three times a day (TID) | ORAL | 12 refills | Status: DC
  Filled 2020-12-09: qty 237, 1d supply, fill #0

## 2020-12-09 MED ORDER — ADULT MULTIVITAMIN W/MINERALS CH
1.0000 | ORAL_TABLET | Freq: Every day | ORAL | 0 refills | Status: DC
  Filled 2020-12-09: qty 30, 30d supply, fill #0

## 2020-12-09 MED ORDER — DOCUSATE SODIUM 50 MG/5ML PO LIQD
100.0000 mg | Freq: Two times a day (BID) | ORAL | Status: DC
  Filled 2020-12-09 (×2): qty 10

## 2020-12-09 MED ORDER — ENSURE ENLIVE PO LIQD
237.0000 mL | Freq: Three times a day (TID) | ORAL | 12 refills | Status: DC
Start: 2020-12-09 — End: 2020-12-09
  Filled 2020-12-09: qty 237, 1d supply, fill #0

## 2020-12-09 MED ORDER — POLYETHYLENE GLYCOL 3350 17 G PO PACK: 17.0000 g | PACK | Freq: Every day | ORAL | Status: DC

## 2020-12-09 MED ORDER — INSULIN ASPART 100 UNIT/ML IJ SOLN
0.0000 [IU] | INTRAMUSCULAR | 11 refills | Status: DC
  Filled 2020-12-09: qty 10, 11d supply, fill #0

## 2020-12-09 NOTE — Consult Note (Signed)
[facility] Face-to-Face Psychiatry Consult   Reason for Consult: Follow-up consult 30 year old man with a history of substance abuse and psychotic symptoms. Referring Physician:  [facility] Patient Identification: Jakori Burkett MRN:  [id number] Principal Diagnosis: Drug-induced psychotic disorder, with unspecified complication (HCC) Diagnosis:  Principal Problem:   Drug-induced psychotic disorder, with unspecified complication (HCC) Active Problems:   Polysubstance abuse (HCC)   Acute respiratory failure (HCC)   Endotracheally intubated   On mechanically assisted ventilation (HCC)   AKI (acute kidney injury) (HCC)   Increased anion gap metabolic acidosis   Lactic acidosis   Total Time spent with patient: 30 minutes  Subjective:   Emrah Ariola is a 30 y.o. male patient admitted with "you still talking about that?".  HPI: Patient seen chart reviewed.  30 year old man with a history of cocaine abuse who came into the hospital after jumping out of a second story window at his home.  Patient seen today he is medically stable and out of the intensive care unit.  Tried to engage him in conversation about his recent mental state and thinking.  Patient remains very evasive and extremely vague.  His answers to questions would always be at best phrased in abstract terms and avoided the specifics of the situation.  He has not been aggressive or violent.  Denies any thought about wanting to harm himself.  Says his plans when he gets out of the hospital are to go back to the work that he has been doing.  He is not certain where he will be staying in the immediate future.  Patient has not been displaying obviously psychotic behaviors or response to internal stimuli while in the hospital.  Past Psychiatric History: Patient has a history of recurrent episodes of presentation to the psychiatric service with varying sorts of psychotic symptoms that seem to also be closely related to substance abuse.  Risk to Self:    Risk to Others:   Prior Inpatient Therapy:   Prior Outpatient Therapy:    Past Medical History: History reviewed. No pertinent past medical history. History reviewed. No pertinent surgical history. Family History: History reviewed. No pertinent family history. Family Psychiatric  History: None known Social History:  Social History   Substance and Sexual Activity  Alcohol Use Yes     Social History   Substance and Sexual Activity  Drug Use Yes   Types: Marijuana, Cocaine    Social History   Socioeconomic History   Marital status: Single    Spouse name: Not on file   Number of children: Not on file   Years of education: Not on file   Highest education level: Not on file  Occupational History   Not on file  Tobacco Use   Smoking status: Some Days   Smokeless tobacco: Never  Substance and Sexual Activity   Alcohol use: Yes   Drug use: Yes    Types: Marijuana, Cocaine   Sexual activity: Not on file  Other Topics Concern   Not on file  Social History Narrative   Not on file   Social Determinants of Health   Financial Resource Strain: Not on file  Food Insecurity: Not on file  Transportation Needs: Not on file  Physical Activity: Not on file  Stress: Not on file  Social Connections: Not on file   Additional Social History:    Allergies:  No Known Allergies  Labs:  Results for orders placed or performed during the hospital encounter of 12/06/20 (from the past 48 hour(s))  Glucose,  capillary     Status: Abnormal   Collection Time: 12/07/20  3:30 PM  Result Value Ref Range   Glucose-Capillary 54 (L) 70 - 99 mg/dL    Comment: Glucose reference range applies only to samples taken after fasting for at least 8 hours.  Glucose, capillary     Status: Abnormal   Collection Time: 12/07/20  7:39 PM  Result Value Ref Range   Glucose-Capillary 123 (H) 70 - 99 mg/dL    Comment: Glucose reference range applies only to samples taken after fasting for at least 8 hours.   Glucose, capillary     Status: Abnormal   Collection Time: 12/08/20 12:01 AM  Result Value Ref Range   Glucose-Capillary 109 (H) 70 - 99 mg/dL    Comment: Glucose reference range applies only to samples taken after fasting for at least 8 hours.  Glucose, capillary     Status: None   Collection Time: 12/08/20  3:54 AM  Result Value Ref Range   Glucose-Capillary 73 70 - 99 mg/dL    Comment: Glucose reference range applies only to samples taken after fasting for at least 8 hours.  CBC     Status: None   Collection Time: 12/08/20  4:45 AM  Result Value Ref Range   WBC 4.1 4.0 - 10.5 K/uL   RBC 4.71 4.22 - 5.81 MIL/uL   Hemoglobin 14.1 13.0 - 17.0 g/dL   HCT 84.1 66.0 - 63.0 %   MCV 89.0 80.0 - 100.0 fL   MCH 29.9 26.0 - 34.0 pg   MCHC 33.7 30.0 - 36.0 g/dL   RDW 16.0 10.9 - 32.3 %   Platelets 183 150 - 400 K/uL   nRBC 0.0 0.0 - 0.2 %    Comment: Performed at [facility], [address] [place], [place] [address]  Basic metabolic panel     Status: Abnormal   Collection Time: 12/08/20  4:45 AM  Result Value Ref Range   Sodium 139 135 - 145 mmol/L   Potassium 3.8 3.5 - 5.1 mmol/L   Chloride 105 98 - 111 mmol/L   CO2 29 22 - 32 mmol/L   Glucose, Bld 106 (H) 70 - 99 mg/dL    Comment: Glucose reference range applies only to samples taken after fasting for at least 8 hours.   BUN 12 6 - 20 mg/dL   Creatinine, Ser 2.02 (H) 0.61 - 1.24 mg/dL   Calcium 8.3 (L) 8.9 - 10.3 mg/dL   GFR, Estimated >54 >27 mL/min    Comment: (NOTE) Calculated using the CKD-EPI Creatinine Equation (2021)    Anion gap 5 5 - 15    Comment: Performed at [facility], [address] [place], [place] [address]  Magnesium     Status: None   Collection Time: 12/08/20  4:45 AM  Result Value Ref Range   Magnesium 2.2 1.7 - 2.4 mg/dL    Comment: Performed at [facility], [address], [place], [place] [address]  Phosphorus     Status: None   Collection Time: 12/08/20  4:45 AM   Result Value Ref Range   Phosphorus 4.4 2.5 - 4.6 mg/dL    Comment: Performed at [facility], [address] [place], [place] [address]  Glucose, capillary     Status: None   Collection Time: 12/08/20  7:48 AM  Result Value Ref Range   Glucose-Capillary 98 70 - 99 mg/dL    Comment: Glucose reference range applies only to  samples taken after fasting for at least 8 hours.  Glucose, capillary     Status: None   Collection Time: 12/08/20  3:24 PM  Result Value Ref Range   Glucose-Capillary 92 70 - 99 mg/dL    Comment: Glucose reference range applies only to samples taken after fasting for at least 8 hours.  Glucose, capillary     Status: Abnormal   Collection Time: 12/08/20  8:40 PM  Result Value Ref Range   Glucose-Capillary 103 (H) 70 - 99 mg/dL    Comment: Glucose reference range applies only to samples taken after fasting for at least 8 hours.  Glucose, capillary     Status: Abnormal   Collection Time: 12/08/20 11:27 PM  Result Value Ref Range   Glucose-Capillary 112 (H) 70 - 99 mg/dL    Comment: Glucose reference range applies only to samples taken after fasting for at least 8 hours.  Glucose, capillary     Status: None   Collection Time: 12/09/20  5:12 AM  Result Value Ref Range   Glucose-Capillary 98 70 - 99 mg/dL    Comment: Glucose reference range applies only to samples taken after fasting for at least 8 hours.  Glucose, capillary     Status: None   Collection Time: 12/09/20  8:15 AM  Result Value Ref Range   Glucose-Capillary 93 70 - 99 mg/dL    Comment: Glucose reference range applies only to samples taken after fasting for at least 8 hours.  CBC with Differential/Platelet     Status: None   Collection Time: 12/09/20  8:32 AM  Result Value Ref Range   WBC 5.3 4.0 - 10.5 K/uL   RBC 5.20 4.22 - 5.81 MIL/uL   Hemoglobin 15.7 13.0 - 17.0 g/dL   HCT 48.5 46.2 - 70.3 %   MCV 88.3 80.0 - 100.0 fL   MCH 30.2 26.0 - 34.0 pg   MCHC 34.2 30.0 - 36.0 g/dL   RDW  50.0 93.8 - 18.2 %   Platelets 189 150 - 400 K/uL   nRBC 0.0 0.0 - 0.2 %   Neutrophils Relative % 65 %   Neutro Abs 3.4 1.7 - 7.7 K/uL   Lymphocytes Relative 23 %   Lymphs Abs 1.2 0.7 - 4.0 K/uL   Monocytes Relative 11 %   Monocytes Absolute 0.6 0.1 - 1.0 K/uL   Eosinophils Relative 1 %   Eosinophils Absolute 0.0 0.0 - 0.5 K/uL   Basophils Relative 0 %   Basophils Absolute 0.0 0.0 - 0.1 K/uL   Immature Granulocytes 0 %   Abs Immature Granulocytes 0.02 0.00 - 0.07 K/uL    Comment: Performed at [facility], [address] [place], [place] [address]  Comprehensive metabolic panel     Status: Abnormal   Collection Time: 12/09/20  8:32 AM  Result Value Ref Range   Sodium 140 135 - 145 mmol/L   Potassium 4.0 3.5 - 5.1 mmol/L   Chloride 104 98 - 111 mmol/L   CO2 28 22 - 32 mmol/L   Glucose, Bld 94 70 - 99 mg/dL    Comment: Glucose reference range applies only to samples taken after fasting for at least 8 hours.   BUN 9 6 - 20 mg/dL   Creatinine, Ser 6.96 0.61 - 1.24 mg/dL   Calcium 9.1 8.9 - 78.9 mg/dL   Total Protein 6.8 6.5 - 8.1 g/dL   Albumin 3.6 3.5 - 5.0 g/dL   AST 90 (H) 15 - 41  U/L   ALT 37 0 - 44 U/L   Alkaline Phosphatase 62 38 - 126 U/L   Total Bilirubin 0.7 0.3 - 1.2 mg/dL   GFR, Estimated >47 >82 mL/min    Comment: (NOTE) Calculated using the CKD-EPI Creatinine Equation (2021)    Anion gap 8 5 - 15    Comment: Performed at [facility], [address], [place], [place] [address]  Magnesium     Status: None   Collection Time: 12/09/20  8:32 AM  Result Value Ref Range   Magnesium 2.0 1.7 - 2.4 mg/dL    Comment: Performed at [facility], [address] [place], [place] [address]  Phosphorus     Status: None   Collection Time: 12/09/20  8:32 AM  Result Value Ref Range   Phosphorus 2.9 2.5 - 4.6 mg/dL    Comment: Performed at [facility], [address] [place], [place] [address]    Current Facility-Administered  Medications  Medication Dose Route Frequency Provider Last Rate Last Admin   albuterol (PROVENTIL) (2.5 MG/3ML) 0.083% nebulizer solution 2.5 mg  2.5 mg Nebulization Q4H PRN Rust-Chester, Cecelia Byars, NP       docusate (COLACE) 50 MG/5ML liquid 100 mg  100 mg Per Tube BID PRN Rust-Chester, Cecelia Byars, NP       docusate (COLACE) 50 MG/5ML liquid 100 mg  100 mg Oral BID Dolan, Carissa E, RPH       feeding supplement (ENSURE ENLIVE / ENSURE PLUS) liquid 237 mL  237 mL Oral TID BM Erin Fulling, MD   237 mL at 12/08/20 2040   heparin injection 5,000 Units  5,000 Units Subcutaneous Q8H Rust-Chester, Britton L, NP   5,000 Units at 12/08/20 1443   insulin aspart (novoLOG) injection 0-15 Units  0-15 Units Subcutaneous Q4H Rust-Chester, Cecelia Byars, NP       multivitamin with minerals tablet 1 tablet  1 tablet Oral Daily Kasa, Kurian, MD       ondansetron (ZOFRAN) injection 4 mg  4 mg Intravenous Q6H PRN Rust-Chester, Britton L, NP       polyethylene glycol (MIRALAX / GLYCOLAX) packet 17 g  17 g Per Tube Daily PRN Rust-Chester, Cecelia Byars, NP       polyethylene glycol (MIRALAX / GLYCOLAX) packet 17 g  17 g Oral Daily Sharen Hones, [facility]        Musculoskeletal: Strength & Muscle Tone: within normal limits Gait & Station: normal Patient leans: N/A            Psychiatric Specialty Exam:  Presentation  General Appearance: Appropriate for Environment  Eye Contact:Good  Speech:Clear and Coherent  Speech Volume:Normal  Handedness:Right   Mood and Affect  Mood:Anxious; Depressed  Affect:Blunt; Congruent   Thought Process  Thought Processes:Coherent  Descriptions of Associations:Intact  Orientation:Full (Time, Place and Person)  Thought Content:Logical  History of Schizophrenia/Schizoaffective disorder:No  Duration of Psychotic Symptoms:Greater than six months  Hallucinations:No data recorded Ideas of Reference:Paranoia  Suicidal Thoughts:No data recorded Homicidal  Thoughts:No data recorded  Sensorium  Memory:Immediate Fair; Recent Fair; Remote Fair  Judgment:Fair  Insight:Fair   Executive Functions  Concentration:Fair  Attention Span:Fair  Recall:Fair  Fund of Knowledge:Fair  Language:Fair   Psychomotor Activity  Psychomotor Activity: No data recorded  Assets  Assets:Communication Skills; Desire for Improvement; Intimacy; Leisure Time; Resilience; Social Support   Sleep  Sleep: No data recorded  Physical Exam: Physical Exam Vitals and nursing note reviewed.  Constitutional:  Appearance: Normal appearance.  HENT:     Head: Normocephalic and atraumatic.     Mouth/Throat:     Pharynx: Oropharynx is clear.  Eyes:     Pupils: Pupils are equal, round, and reactive to light.  Cardiovascular:     Rate and Rhythm: Normal rate and regular rhythm.  Pulmonary:     Effort: Pulmonary effort is normal.     Breath sounds: Normal breath sounds.  Abdominal:     General: Abdomen is flat.     Palpations: Abdomen is soft.  Musculoskeletal:        General: Normal range of motion.  Skin:    General: Skin is warm and dry.  Neurological:     General: No focal deficit present.     Mental Status: He is alert. Mental status is at baseline.  Psychiatric:        Attention and Perception: He is inattentive.        Mood and Affect: Mood normal. Affect is blunt.        Speech: Speech is delayed.        Behavior: Behavior is slowed.        Thought Content: Thought content is paranoid. Thought content does not include homicidal or suicidal ideation.        Cognition and Memory: Memory is impaired.        Judgment: Judgment is impulsive.   Review of Systems  Constitutional: Negative.   HENT: Negative.    Eyes: Negative.   Respiratory: Negative.    Cardiovascular: Negative.   Gastrointestinal: Negative.   Musculoskeletal: Negative.   Skin: Negative.   Neurological: Negative.   Psychiatric/Behavioral:  Positive for memory loss and  substance abuse. Negative for depression, hallucinations and suicidal ideas. The patient is not nervous/anxious and does not have insomnia.   Blood pressure (!) 145/93, pulse 74, temperature 97.8 F (36.6 C), temperature source Oral, resp. rate 18, height 5\' 11"  (1.803 m), weight 76.4 kg, SpO2 100 %. Body mass index is 23.49 kg/m.  Treatment Plan Summary: Plan spoke with patient for quite a while making it clear to him that I felt that it was very possible that he had a psychotic disorder but that is presentation and his behavior have made it difficult for to engage in treatment.  I described for him psychotic symptoms and made it clear to him that if these things were occurring when he was not acutely intoxicated that this was a condition that could be treated and if treated could greatly improve his functioning and outcome.  Also of course emphasized to him that it is clear that stimulant drugs do not agree with him and lead him into dangerous situations.  Patient was passive in his engagement with any of this.  Listened when I talked about referral to [facility] but did not make any promise to follow up.  Nevertheless at this point he is not acutely suicidal and there is no evidence that he was actually trying to hurt himself when he jumped out the window.  He has not been aggressive or impulsive here in the hospital.  Patient at this point does not meet commitment criteria to justify further forcing him to stay in the hospital.  I have discontinued the IVC.  Patient can be discharged if he is medically stable and strongly encouraged to stop drug use and follow-up with local mental health.  Disposition: No evidence of imminent risk to self or others at present.   Patient  does not meet criteria for psychiatric inpatient admission. Supportive therapy provided about ongoing stressors. Discussed crisis plan, support from social network, calling 911, coming to the Emergency Department, and calling Suicide  Hotline.  Mordecai Rasmussen, MD 12/09/2020 3:08 PM

## 2020-12-09 NOTE — Discharge Summary (Signed)
Physician Discharge Summary  Bradley Casey IHW:[id number] DOB: 1990/10/15 DOA: 12/06/2020  PCP: Patient, No Pcp Per (Inactive)  Admit date: 12/06/2020 Discharge date: 12/09/2020  Admitted From: Home Disposition: Home  Recommendations for Outpatient Follow-up:  Follow up with PCP in 1-2 weeks Follow-up with psychiatry within 1 to 2 weeks Please obtain CMP/CBC, Mag, Phos in one week Please follow up on the following pending results:  Home Health: No  Equipment/Devices: None    Discharge Condition: Stable CODE STATUS: FULL CODE  Diet recommendation: Regular Diet   Brief/Interim Summary: Patient is a 30 year old African-American male who presented to [facility] ED via EMS from his mother's home on 12/06/2020 after he jumped out of the second story building and fell onto the concrete driveway.  Per the patient's father reports he has a history of polysubstance abuse in particular cocaine abuse.  Yesterday evening he had gone over to his mother's house and was acting erratically and eventually jumped out of a second story window landing on the concrete driveway.  Patient's father went to assist him and EMS and fire department were arriving.  Vision became very agitated and confused and ran away from the first responders about 2 blocks and then patient's father believes that there is underlying psychosis and mental health disorder in addition to substance abuse as he reported patient goes to periods of paranoia.  Subsequently had severe agitation leading to respiratory failure from cocaine poisoning and he was admitted to ICU and intubated emergently with mechanical ventilation in the ED due to his severe only altered mental status and agitation.  On 12/08/2018 remained intubated and sedated and then 12/08/2020 he was extubated and on 12/09/2020 he was transferred to [facility] service and psychiatry was consulted.  Psychiatry felt the patient was in no harm to himself or others and cleared him from a psych  perspective and rescinded the patient's IVC and felt that he was stable from a psychiatric standpoint to be discharged.  Given that patient's medical issues improved and that he was back to his baseline he was discharged home and will need to follow-up with PCP and psychiatry in outpatient setting  Discharge Diagnoses:  Principal Problem:   Drug-induced psychotic disorder, with unspecified complication (HCC) Active Problems:   Polysubstance abuse (HCC)   Acute respiratory failure (HCC)   Endotracheally intubated   On mechanically assisted ventilation (HCC)   AKI (acute kidney injury) (HCC)   Increased anion gap metabolic acidosis   Lactic acidosis  Acute severe toxic metabolic encephalopathy in the setting of cocaine -Improved significantly and patient back to his baseline -Psychiatry is consulted and cleared the patient from a psychiatric standpoint for discharge  Acute hypoxic hypercapnic respiratory failure in the setting of cocaine poisoning and agitation with inability to protect his airway -Patient was placed on ventilator support and was initiated on bronchodilator -He had a spontaneous breathing trial which she passed and was extubated 12/08/2020 and transferred to [facility] service in the floor -From respiratory standpoint he is stable and did not desaturate -He did not need any more sedation given that he is back to his baseline  AKI -Had a Foley catheter placed which is now removed -Patient's BUNs/creatinine were elevated and trended back down and his creatinine went from 1.41-1.07 -Avoid further nephrotoxic medications, contrast dyes, hypotension renally dose medication -Repeat CMP in the outpatient setting as patient is urinating on his own  Suspected psychotic disorder -Psychiatry was consulted and they felt that it is very possible that he had a psychotic  disorder but the presentation his behavior made it difficult for them to engage in treatment.  Dr. Toni Amend spoke with the  patient at length and described for him psychotic symptoms and made it clear to him that if these were occurring while he was occurring not acutely intoxicated that this condition that could be treated and treated greatly improving his functional outcome; patient was passive in his engagement with any of this and agreed for a [facility] referral but did not promise to follow-up -Psychiatry felt the patient was not suicidal and that there is no evidence of that that he is after he found out hurt himself and jump of the window and he was not aggressive or impulsive and is back to his baseline -Psychiatry felt that that he did not meet any communicative care to justify forcing him to stay in the hospital any longer and discontinued IVC and cleared the patient psychiatrically and encouraged stopping drug use and following up with local mental health -We will resume his olanzapine at discharge but will follow-up with psychiatry in outpatient setting  Polysubstance abuse -Counseling given and recommended cessation of all illicit substances -Urine drug screen was positive for benzodiazepines, cocaine as well as cannabis  Hematuria -In the setting of Foley catheter -Repeat urinalysis in outpatient setting as he has no issues urinating and his hemoglobin is stable  Discharge Instructions  Discharge Instructions     Call MD for:  difficulty breathing, headache or visual disturbances   Complete by: As directed    Call MD for:  extreme fatigue   Complete by: As directed    Call MD for:  hives   Complete by: As directed    Call MD for:  persistant dizziness or light-headedness   Complete by: As directed    Call MD for:  persistant nausea and vomiting   Complete by: As directed    Call MD for:  redness, tenderness, or signs of infection (pain, swelling, redness, odor or green/yellow discharge around incision site)   Complete by: As directed    Call MD for:  severe uncontrolled pain   Complete by: As  directed    Call MD for:  temperature >100.4   Complete by: As directed    Diet - low sodium heart healthy   Complete by: As directed    Discharge instructions   Complete by: As directed    You were cared for by a hospitalist during your hospital stay. If you have any questions about your discharge medications or the care you received while you were in the hospital after you are discharged, you can call the unit and ask to speak with the hospitalist on call if the hospitalist that took care of you is not available. Once you are discharged, your primary care physician will handle any further medical issues. Please note that NO REFILLS for any discharge medications will be authorized once you are discharged, as it is imperative that you return to your primary care physician (or establish a relationship with a primary care physician if you do not have one) for your aftercare needs so that they can reassess your need for medications and monitor your lab values.  Follow up with PCP and Psychiatry as an outpatient. Avoid illicit Substances. Take all medications as prescribed. If symptoms change or worsen please return to the ED for evaluation   Increase activity slowly   Complete by: As directed       Allergies as of 12/09/2020   No  Known Allergies      Medication List     TAKE these medications    feeding supplement Liqd Take 237 mLs by mouth 3 (three) times daily between meals.   insulin aspart 100 UNIT/ML injection Commonly known as: novoLOG Inject 0-15 Units into the skin every 4 (four) hours.   multivitamin with minerals Tabs tablet Take 1 tablet by mouth daily. Start taking on: December 10, 2020   OLANZapine 7.5 MG tablet Commonly known as: ZYPREXA Take 1 tablet (7.5 mg total) by mouth at bedtime.        Follow-up Information     [facility] Call.   Why: Follow up within 1-2 weeks Contact information: [address] [place]  [address] [phone]                No Known Allergies  Consultations: PCCM Transfer Psychiatry  Procedures/Studies: CT HEAD WO CONTRAST  Result Date: 12/06/2020 CLINICAL DATA:  Head trauma, intracranial venous injury suspected. EXAM: CT HEAD WITHOUT CONTRAST TECHNIQUE: Contiguous axial images were obtained from the base of the skull through the vertex without intravenous contrast. COMPARISON:  None. FINDINGS: Brain: No acute intracranial abnormality. Specifically, no hemorrhage, hydrocephalus, mass lesion, acute infarction, or significant intracranial injury. Vascular: No hyperdense vessel or unexpected calcification. Skull: No acute calvarial abnormality. Sinuses/Orbits: No acute findings Other: None IMPRESSION: Normal study. Electronically Signed   By: Charlett Nose M.D.   On: 12/06/2020 22:13   CT CERVICAL SPINE WO CONTRAST  Result Date: 12/06/2020 CLINICAL DATA:  Facial trauma EXAM: CT CERVICAL SPINE WITHOUT CONTRAST TECHNIQUE: Multidetector CT imaging of the cervical spine was performed without intravenous contrast. Multiplanar CT image reconstructions were also generated. COMPARISON:  None. FINDINGS: Alignment: Normal Skull base and vertebrae: No acute fracture. No primary bone lesion or focal pathologic process. Soft tissues and spinal canal: No prevertebral fluid or swelling. No visible canal hematoma. Disc levels:  Normal Upper chest: Negative Other: Endotracheal tube in place with the trachea. NG tube in the esophagus. IMPRESSION: No bony abnormality. Electronically Signed   By: Charlett Nose M.D.   On: 12/06/2020 22:14   CT CHEST ABDOMEN PELVIS W CONTRAST  Addendum Date: 12/07/2020   ADDENDUM REPORT: 12/07/2020 03:48 ADDENDUM: On additional review, the patient has a Foley catheter which is inflated within the membranous portion of the urethra, below the level of the prostate. This is not located within the bladder. Electronically Signed   By: Charline Bills M.D.   On:  12/07/2020 03:48   Result Date: 12/07/2020 CLINICAL DATA:  Trauma, jumped from 2nd story building EXAM: CT CHEST, ABDOMEN, AND PELVIS WITH CONTRAST TECHNIQUE: Multidetector CT imaging of the chest, abdomen and pelvis was performed following the standard protocol during bolus administration of intravenous contrast. CONTRAST:  63mL OMNIPAQUE IOHEXOL 350 MG/ML SOLN COMPARISON:  None. FINDINGS: CT CHEST FINDINGS Cardiovascular: No evidence of traumatic aortic injury. The heart is normal in size.  No pericardial effusion. Mediastinum/Nodes: No evidence of anterior mediastinal hematoma. No suspicious mediastinal lymphadenopathy. Visualized thyroid is unremarkable. Lungs/Pleura: Endotracheal tube terminates 2.5 cm above the carina. Lungs are essentially clear. No focal consolidation. No suspicious pulmonary nodules. No pleural effusion or pneumothorax. Musculoskeletal: No fracture is seen. Sternum, clavicles, scapulae, and bilateral ribs are intact. Thoracic spine is within normal limits. CT ABDOMEN PELVIS FINDINGS Hepatobiliary: Liver is within normal limits. No perihepatic fluid/hemorrhage. Gallbladder is unremarkable. No intrahepatic or extrahepatic ductal dilatation. Pancreas: Within normal limits. Spleen: Within normal limits.  No perisplenic  fluid/hemorrhage. Adrenals/Urinary Tract: Adrenal glands are within normal limits. Kidneys are within normal limits.  No hydronephrosis. Bladder is mildly thick-walled although underdistended. Stomach/Bowel: Enteric tube terminates in the proximal stomach. No evidence of bowel obstruction. Normal appendix (series 4/image 108). Vascular/Lymphatic: No evidence of abdominal aortic aneurysm. No suspicious abdominopelvic lymphadenopathy. Reproductive: Prostate is unremarkable. Other: No abdominopelvic ascites. No hemoperitoneum or free air. Musculoskeletal: No fracture is seen. Lumbar spine, pelvis, and proximal femurs are intact. IMPRESSION: No evidence of traumatic injury to the  chest, abdomen, or pelvis. Endotracheal tube terminates 2.5 cm above the carina. Enteric tube terminates in the proximal stomach. Electronically Signed: By: Charline Bills M.D. On: 12/06/2020 22:18   DG Chest Port 1 View  Result Date: 12/07/2020 CLINICAL DATA:  Fall EXAM: PORTABLE CHEST 1 VIEW COMPARISON:  2018 FINDINGS: Endotracheal and enteric tubes in satisfactory position. The heart size and mediastinal contours are within normal limits. Both lungs are clear. No pleural effusion or pneumothorax. The visualized skeletal structures are unremarkable. IMPRESSION: No acute process in the chest. Electronically Signed   By: Guadlupe Spanish M.D.   On: 12/07/2020 08:27     Subjective: Seen and examined at bedside in just wanted to rest and felt okay.  No nausea or vomiting.  Denies any complaints and did not want an IV.  Felt okay and psychiatry cleared him so he was stable for discharge.  Discharge Exam: Vitals:   12/09/20 0818 12/09/20 1552  BP: (!) 145/93 (!) 151/115  Pulse: 74 78  Resp: 18 20  Temp: 97.8 F (36.6 C) 99.1 F (37.3 C)  SpO2: 100% 99%   Vitals:   12/08/20 2327 12/09/20 0513 12/09/20 0818 12/09/20 1552  BP: 133/75 (!) 164/109 (!) 145/93 (!) 151/115  Pulse: 82 86 74 78  Resp: 20 20 18 20   Temp: 98.6 F (37 C) 98 F (36.7 C) 97.8 F (36.6 C) 99.1 F (37.3 C)  TempSrc:  Oral Oral Oral  SpO2: 97% 98% 100% 99%  Weight:      Height:       General: Pt is alert, awake, not in acute distress and he is not confused or delirious Cardiovascular: RRR, S1/S2 +, no rubs, no gallops Respiratory: Diminished slightly bilaterally, no wheezing, no rhonchi Abdominal: Soft, NT, ND, bowel sounds + Extremities: no edema, no cyanosis; has multiple tattoos noted including face tattoos and tattoos on his arm and chest  The results of significant diagnostics from this hospitalization (including imaging, microbiology, ancillary and laboratory) are listed below for reference.     Microbiology: Recent Results (from the past 240 hour(s))  Resp Panel by RT-PCR (Flu A&B, Covid) Nasopharyngeal Swab     Status: None   Collection Time: 12/06/20  9:27 PM   Specimen: Nasopharyngeal Swab; Nasopharyngeal(NP) swabs in vial transport medium  Result Value Ref Range Status   SARS Coronavirus 2 by RT PCR NEGATIVE NEGATIVE Final    Comment: (NOTE) SARS-CoV-2 target nucleic acids are NOT DETECTED.  The SARS-CoV-2 RNA is generally detectable in upper respiratory specimens during the acute phase of infection. The lowest concentration of SARS-CoV-2 viral copies this assay can detect is 138 copies/mL. A negative result does not preclude SARS-Cov-2 infection and should not be used as the sole basis for treatment or other patient management decisions. A negative result may occur with  improper specimen collection/handling, submission of specimen other than nasopharyngeal swab, presence of viral mutation(s) within the areas targeted by this assay, and inadequate number of viral copies(<138 copies/mL). A  negative result must be combined with clinical observations, patient history, and epidemiological information. The expected result is Negative.  Fact Sheet for Patients:  BloggerCourse.com  Fact Sheet for Healthcare Providers:  SeriousBroker.it  This test is no t yet approved or cleared by the [place] FDA and  has been authorized for detection and/or diagnosis of SARS-CoV-2 by FDA under an Emergency Use Authorization (EUA). This EUA will remain  in effect (meaning this test can be used) for the duration of the COVID-19 declaration under Section 564(b)(1) of the Act, 21 U.S.C.section 360bbb-3(b)(1), unless the authorization is terminated  or revoked sooner.       Influenza A by PCR NEGATIVE NEGATIVE Final   Influenza B by PCR NEGATIVE NEGATIVE Final    Comment: (NOTE) The Xpert Xpress SARS-CoV-2/FLU/RSV plus assay is  intended as an aid in the diagnosis of influenza from Nasopharyngeal swab specimens and should not be used as a sole basis for treatment. Nasal washings and aspirates are unacceptable for Xpert Xpress SARS-CoV-2/FLU/RSV testing.  Fact Sheet for Patients: BloggerCourse.com  Fact Sheet for Healthcare Providers: SeriousBroker.it  This test is not yet approved or cleared by the [place] FDA and has been authorized for detection and/or diagnosis of SARS-CoV-2 by FDA under an Emergency Use Authorization (EUA). This EUA will remain in effect (meaning this test can be used) for the duration of the COVID-19 declaration under Section 564(b)(1) of the Act, 21 U.S.C. section 360bbb-3(b)(1), unless the authorization is terminated or revoked.  Performed at [facility], [address] [place], [place] [address]   MRSA Next Gen by PCR, Nasal     Status: None   Collection Time: 12/07/20 12:27 AM   Specimen: Nasal Mucosa; Nasal Swab  Result Value Ref Range Status   MRSA by PCR Next Gen NOT DETECTED NOT DETECTED Final    Comment: (NOTE) The GeneXpert MRSA Assay (FDA approved for NASAL specimens only), is one component of a comprehensive MRSA colonization surveillance program. It is not intended to diagnose MRSA infection nor to guide or monitor treatment for MRSA infections. Test performance is not FDA approved in patients less than 3 years old. Performed at [facility], [address] [place], [place] [address]     Labs: BNP (last 3 results) No results for input(s): BNP in the last 8760 hours. Basic Metabolic Panel: Recent Labs  Lab 12/06/20 2127 12/07/20 0503 12/08/20 0445 12/09/20 0832  NA 137 137 139 140  K 4.0 4.0 3.8 4.0  CL 96* 102 105 104  CO2 16* GLUCOSE 276* 119* 106* 94  BUN CREATININE 2.44* 1.46* 1.41* 1.07  CALCIUM 9.9 8.9 8.3* 9.1  MG 2.8* 2.5* 2.2 2.0  PHOS 6.3*  4.6 4.4 2.9   Liver Function Tests: Recent Labs  Lab 12/06/20 2127 12/09/20 0832  AST 86* 90*  ALT 43 37  ALKPHOS 70 62  BILITOT 0.8 0.7  PROT 7.7 6.8  ALBUMIN 4.4 3.6   No results for input(s): LIPASE, AMYLASE in the last 168 hours. No results for input(s): AMMONIA in the last 168 hours. CBC: Recent Labs  Lab 12/06/20 2127 12/07/20 0241 12/07/20 0503 12/08/20 0445 12/09/20 0832  WBC 9.8  --  12.6* 4.1 5.3  NEUTROABS  --   --   --   --  3.4  HGB 17.3* 14.7 15.3 14.1 15.7  HCT 52.4* 42.9 45.1 41.9 45.9  MCV 89.0  --  86.1 89.0 88.3  PLT 302  --  223 183 189   Cardiac Enzymes: Recent Labs  Lab 12/06/20 2127 12/07/20 0503  CKTOTAL  --  360  CKMB 3.9  --    BNP: Invalid input(s): POCBNP CBG: Recent Labs  Lab 12/08/20 1524 12/08/20 2040 12/08/20 2327 12/09/20 0512 12/09/20 0815  GLUCAP 92 103* 112* 98 93   D-Dimer No results for input(s): DDIMER in the last 72 hours. Hgb A1c No results for input(s): HGBA1C in the last 72 hours. Lipid Profile No results for input(s): CHOL, HDL, LDLCALC, TRIG, CHOLHDL, LDLDIRECT in the last 72 hours. Thyroid function studies No results for input(s): TSH, T4TOTAL, T3FREE, THYROIDAB in the last 72 hours.  Invalid input(s): FREET3 Anemia work up No results for input(s): VITAMINB12, FOLATE, FERRITIN, TIBC, IRON, RETICCTPCT in the last 72 hours. Urinalysis    Component Value Date/Time   COLORURINE RED (A) 12/07/2020 0220   APPEARANCEUR BLOODY (A) 12/07/2020 0220   APPEARANCEUR Hazy 12/20/2012 1603   LABSPEC 1.048 (H) 12/07/2020 0220   LABSPEC 1.031 12/20/2012 1603   PHURINE  12/07/2020 0220    TEST NOT REPORTED DUE TO COLOR INTERFERENCE OF URINE PIGMENT   GLUCOSEU (A) 12/07/2020 0220    TEST NOT REPORTED DUE TO COLOR INTERFERENCE OF URINE PIGMENT   GLUCOSEU Negative 12/20/2012 1603   HGBUR (A) 12/07/2020 0220    TEST NOT REPORTED DUE TO COLOR INTERFERENCE OF URINE PIGMENT   BILIRUBINUR (A) 12/07/2020 0220    TEST NOT  REPORTED DUE TO COLOR INTERFERENCE OF URINE PIGMENT   BILIRUBINUR 1+ 12/20/2012 1603   KETONESUR (A) 12/07/2020 0220    TEST NOT REPORTED DUE TO COLOR INTERFERENCE OF URINE PIGMENT   PROTEINUR (A) 12/07/2020 0220    TEST NOT REPORTED DUE TO COLOR INTERFERENCE OF URINE PIGMENT   NITRITE (A) 12/07/2020 0220    TEST NOT REPORTED DUE TO COLOR INTERFERENCE OF URINE PIGMENT   LEUKOCYTESUR (A) 12/07/2020 0220    TEST NOT REPORTED DUE TO COLOR INTERFERENCE OF URINE PIGMENT   LEUKOCYTESUR Trace 12/20/2012 1603   Sepsis Labs Invalid input(s): PROCALCITONIN,  WBC,  LACTICIDVEN Microbiology Recent Results (from the past 240 hour(s))  Resp Panel by RT-PCR (Flu A&B, Covid) Nasopharyngeal Swab     Status: None   Collection Time: 12/06/20  9:27 PM   Specimen: Nasopharyngeal Swab; Nasopharyngeal(NP) swabs in vial transport medium  Result Value Ref Range Status   SARS Coronavirus 2 by RT PCR NEGATIVE NEGATIVE Final    Comment: (NOTE) SARS-CoV-2 target nucleic acids are NOT DETECTED.  The SARS-CoV-2 RNA is generally detectable in upper respiratory specimens during the acute phase of infection. The lowest concentration of SARS-CoV-2 viral copies this assay can detect is 138 copies/mL. A negative result does not preclude SARS-Cov-2 infection and should not be used as the sole basis for treatment or other patient management decisions. A negative result may occur with  improper specimen collection/handling, submission of specimen other than nasopharyngeal swab, presence of viral mutation(s) within the areas targeted by this assay, and inadequate number of viral copies(<138 copies/mL). A negative result must be combined with clinical observations, patient history, and epidemiological information. The expected result is Negative.  Fact Sheet for Patients:  BloggerCourse.com  Fact Sheet for Healthcare Providers:  SeriousBroker.it  This test is no t  yet approved or cleared by the [place] FDA and  has been authorized for detection and/or diagnosis of SARS-CoV-2 by FDA under an Emergency Use Authorization (EUA). This EUA will remain  in effect (  meaning this test can be used) for the duration of the COVID-19 declaration under Section 564(b)(1) of the Act, 21 U.S.C.section 360bbb-3(b)(1), unless the authorization is terminated  or revoked sooner.       Influenza A by PCR NEGATIVE NEGATIVE Final   Influenza B by PCR NEGATIVE NEGATIVE Final    Comment: (NOTE) The Xpert Xpress SARS-CoV-2/FLU/RSV plus assay is intended as an aid in the diagnosis of influenza from Nasopharyngeal swab specimens and should not be used as a sole basis for treatment. Nasal washings and aspirates are unacceptable for Xpert Xpress SARS-CoV-2/FLU/RSV testing.  Fact Sheet for Patients: BloggerCourse.com  Fact Sheet for Healthcare Providers: SeriousBroker.it  This test is not yet approved or cleared by the [place] FDA and has been authorized for detection and/or diagnosis of SARS-CoV-2 by FDA under an Emergency Use Authorization (EUA). This EUA will remain in effect (meaning this test can be used) for the duration of the COVID-19 declaration under Section 564(b)(1) of the Act, 21 U.S.C. section 360bbb-3(b)(1), unless the authorization is terminated or revoked.  Performed at [facility], [address] [place], [place] [address]   MRSA Next Gen by PCR, Nasal     Status: None   Collection Time: 12/07/20 12:27 AM   Specimen: Nasal Mucosa; Nasal Swab  Result Value Ref Range Status   MRSA by PCR Next Gen NOT DETECTED NOT DETECTED Final    Comment: (NOTE) The GeneXpert MRSA Assay (FDA approved for NASAL specimens only), is one component of a comprehensive MRSA colonization surveillance program. It is not intended to diagnose MRSA infection nor to guide or monitor treatment for MRSA  infections. Test performance is not FDA approved in patients less than 42 years old. Performed at [facility], [address], [place], [place] [address]    Time coordinating discharge: 35 minutes  SIGNED:  Merlene Laughter, DO [facility] 12/09/2020, 4:10 PM Pager is on AMION  If 7PM-7AM, please contact night-coverage [email or web address]

## 2020-12-09 NOTE — TOC Transition Note (Signed)
Transition of Care [facility]) - CM/SW Discharge Note   Patient Details  Name: Bradley Casey MRN: [id number] Date of Birth: [birth year]  Transition of Care [facility]) CM/SW Contact:  Allayne Butcher, RN Phone Number: 12/09/2020, 4:07 PM   Clinical Narrative:    Patient medically and psych cleared, IVC lifted.  RNCM printed substance abuse resources and bedside RN will present to patient with discharge paperwork.     Final next level of care: Home/Case[facility] Care Barriers to Discharge: Barriers Resolved   Patient Goals and CMS Choice Patient states their goals for this hospitalization and ongoing recovery are:: To return home per Teryl Lucy [phone]      Discharge Placement                       Discharge Plan and Services                                     Social Determinants of Health (SDOH) Interventions     Readmission Risk Interventions No flowsheet data found.

## 2020-12-10 ENCOUNTER — Other Ambulatory Visit: Payer: Self-pay

## 2021-01-29 ENCOUNTER — Emergency Department
Admission: EM | Admit: 2021-01-29 | Discharge: 2021-01-29 | Disposition: A | Discharge status: Other Acute Inpatient Hospital | Payer: 59 | Attending: Emergency Medicine | Admitting: Emergency Medicine

## 2021-01-29 ENCOUNTER — Inpatient Hospital Stay
Admission: RE | Admit: 2021-01-29 | Discharge: 2021-02-03 | DRG: 885 | Disposition: A | Discharge status: Home / Self Care | Payer: 59 | Source: Intra-hospital | Attending: Psychiatry | Admitting: Psychiatry

## 2021-01-29 ENCOUNTER — Encounter: Payer: Self-pay | Admitting: Emergency Medicine

## 2021-01-29 ENCOUNTER — Other Ambulatory Visit: Payer: Self-pay

## 2021-01-29 ENCOUNTER — Encounter: Payer: Self-pay | Admitting: Psychiatry

## 2021-01-29 DIAGNOSIS — Z79899 Other long term (current) drug therapy: Secondary | ICD-10-CM | POA: Insufficient documentation

## 2021-01-29 DIAGNOSIS — F2081 Schizophreniform disorder: Principal | ICD-10-CM

## 2021-01-29 DIAGNOSIS — Z20822 Contact with and (suspected) exposure to covid-19: Secondary | ICD-10-CM | POA: Insufficient documentation

## 2021-01-29 DIAGNOSIS — F14159 Cocaine abuse with cocaine-induced psychotic disorder, unspecified: Secondary | ICD-10-CM | POA: Diagnosis present

## 2021-01-29 DIAGNOSIS — F1910 Other psychoactive substance abuse, uncomplicated: Secondary | ICD-10-CM

## 2021-01-29 DIAGNOSIS — F17200 Nicotine dependence, unspecified, uncomplicated: Secondary | ICD-10-CM | POA: Insufficient documentation

## 2021-01-29 DIAGNOSIS — F14951 Cocaine use, unspecified with cocaine-induced psychotic disorder with hallucinations: Secondary | ICD-10-CM | POA: Diagnosis not present

## 2021-01-29 DIAGNOSIS — Z046 Encounter for general psychiatric examination, requested by authority: Secondary | ICD-10-CM | POA: Insufficient documentation

## 2021-01-29 DIAGNOSIS — Y900 Blood alcohol level of less than 20 mg/100 ml: Secondary | ICD-10-CM | POA: Insufficient documentation

## 2021-01-29 LAB — CBC
HCT: 46.6 % (ref 39.0–52.0)
Hemoglobin: 15.9 g/dL (ref 13.0–17.0)
MCH: 30.1 pg (ref 26.0–34.0)
MCHC: 34.1 g/dL (ref 30.0–36.0)
MCV: 88.1 fL (ref 80.0–100.0)
Platelets: 254 10*3/uL (ref 150–400)
RBC: 5.29 MIL/uL (ref 4.22–5.81)
RDW: 12.7 % (ref 11.5–15.5)
WBC: 9.1 10*3/uL (ref 4.0–10.5)
nRBC: 0 % (ref 0.0–0.2)

## 2021-01-29 LAB — URINE DRUG SCREEN, QUALITATIVE (ARMC ONLY)
Amphetamines, Ur Screen: NOT DETECTED
Barbiturates, Ur Screen: NOT DETECTED
Benzodiazepine, Ur Scrn: NOT DETECTED
Cannabinoid 50 Ng, Ur ~~LOC~~: POSITIVE — AB
Cocaine Metabolite,Ur ~~LOC~~: POSITIVE — AB
MDMA (Ecstasy)Ur Screen: NOT DETECTED
Methadone Scn, Ur: NOT DETECTED
Opiate, Ur Screen: NOT DETECTED
Phencyclidine (PCP) Ur S: NOT DETECTED
Tricyclic, Ur Screen: NOT DETECTED

## 2021-01-29 LAB — BASIC METABOLIC PANEL
Anion gap: 9 (ref 5–15)
BUN: 14 mg/dL (ref 6–20)
CO2: 28 mmol/L (ref 22–32)
Calcium: 9.4 mg/dL (ref 8.9–10.3)
Chloride: 97 mmol/L — ABNORMAL LOW (ref 98–111)
Creatinine, Ser: 1.15 mg/dL (ref 0.61–1.24)
GFR, Estimated: >60 mL/min (ref >60)
Glucose, Bld: 123 mg/dL — ABNORMAL HIGH (ref 70–99)
Potassium: 3.5 mmol/L (ref 3.5–5.1)
Sodium: 134 mmol/L — ABNORMAL LOW (ref 135–145)

## 2021-01-29 LAB — SALICYLATE LEVEL: Salicylate Lvl: <7 mg/dL — ABNORMAL LOW (ref 7.0–30.0)

## 2021-01-29 LAB — ETHANOL: Alcohol, Ethyl (B): <10 mg/dL (ref <10)

## 2021-01-29 LAB — RESP PANEL BY RT-PCR (FLU A&B, COVID) ARPGX2
Influenza A by PCR: NEGATIVE
Influenza B by PCR: NEGATIVE
SARS Coronavirus 2 by RT PCR: NEGATIVE

## 2021-01-29 LAB — CBG MONITORING, ED: Glucose-Capillary: 108 mg/dL — ABNORMAL HIGH (ref 70–99)

## 2021-01-29 LAB — ACETAMINOPHEN LEVEL: Acetaminophen (Tylenol), Serum: <10 ug/mL — ABNORMAL LOW (ref 10–30)

## 2021-01-29 MED ORDER — GABAPENTIN 300 MG PO CAPS
300.0000 mg | ORAL_CAPSULE | Freq: Three times a day (TID) | ORAL | Status: DC
  Administered 2021-01-29 – 2021-02-01 (×9): 300 mg via ORAL
  Filled 2021-01-29 (×10): qty 1

## 2021-01-29 MED ORDER — HALOPERIDOL 2 MG PO TABS
2.0000 mg | ORAL_TABLET | Freq: Two times a day (BID) | ORAL | Status: DC
  Administered 2021-01-29: 2 mg via ORAL
  Filled 2021-01-29 (×2): qty 1

## 2021-01-29 MED ORDER — HALOPERIDOL 1 MG PO TABS
2.0000 mg | ORAL_TABLET | Freq: Two times a day (BID) | ORAL | Status: DC
  Administered 2021-01-29 – 2021-02-01 (×6): 2 mg via ORAL
  Filled 2021-01-29 (×6): qty 2

## 2021-01-29 MED ORDER — MAGNESIUM HYDROXIDE 400 MG/5ML PO SUSP: 30.0000 mL | Freq: Every day | ORAL | Status: DC | PRN

## 2021-01-29 MED ORDER — LORAZEPAM 1 MG PO TABS
1.0000 mg | ORAL_TABLET | Freq: Once | ORAL | Status: AC
  Administered 2021-01-29: 1 mg via ORAL
  Filled 2021-01-29: qty 1

## 2021-01-29 MED ORDER — ADULT MULTIVITAMIN W/MINERALS CH
1.0000 | ORAL_TABLET | Freq: Every day | ORAL | Status: DC
  Administered 2021-01-29 – 2021-02-03 (×6): 1 via ORAL
  Filled 2021-01-29 (×6): qty 1

## 2021-01-29 MED ORDER — ACETAMINOPHEN 325 MG PO TABS
650.0000 mg | ORAL_TABLET | Freq: Four times a day (QID) | ORAL | Status: DC | PRN
  Filled 2021-01-29: qty 2

## 2021-01-29 MED ORDER — ENSURE ENLIVE PO LIQD
237.0000 mL | Freq: Three times a day (TID) | ORAL | Status: DC
  Administered 2021-01-29 – 2021-02-03 (×14): 237 mL via ORAL

## 2021-01-29 MED ORDER — ALUM & MAG HYDROXIDE-SIMETH 200-200-20 MG/5ML PO SUSP: 30.0000 mL | ORAL | Status: DC | PRN

## 2021-01-29 MED ORDER — BENZTROPINE MESYLATE 1 MG PO TABS
0.5000 mg | ORAL_TABLET | Freq: Every day | ORAL | Status: DC
  Administered 2021-01-29: 0.5 mg via ORAL
  Filled 2021-01-29: qty 1

## 2021-01-29 MED ORDER — BENZTROPINE MESYLATE 1 MG PO TABS
0.5000 mg | ORAL_TABLET | Freq: Every day | ORAL | Status: DC
  Administered 2021-01-30 – 2021-02-03 (×5): 0.5 mg via ORAL
  Filled 2021-01-29 (×5): qty 1

## 2021-01-29 MED ORDER — GABAPENTIN 300 MG PO CAPS
300.0000 mg | ORAL_CAPSULE | Freq: Three times a day (TID) | ORAL | Status: DC
  Administered 2021-01-29: 300 mg via ORAL
  Filled 2021-01-29: qty 1

## 2021-01-29 NOTE — ED Notes (Signed)
Lunch tray given. 

## 2021-01-29 NOTE — ED Notes (Signed)
Pt. Transferred from Triage to room [facility] after dressing out and screening for contraband.  Pt. Oriented to [place] including Q15 minute rounds as well as Psychologist, counselling for their protection. Patient is alert and oriented, warm and dry in no acute distress. Patient denies SI. Reported HI. He said people use his drug you as a scape goat to say he is hallucinating. Denied AVH. Admitted to drug use. Pt. Encouraged to let me know if needs arise.

## 2021-01-29 NOTE — ED Provider Notes (Signed)
[facility] Emergency Department Provider Note  ____________________________________________   Event Date/Time   First MD Initiated Contact with Patient 01/29/21 [phone]     (approximate)  I have reviewed the triage vital signs and the nursing notes.   HISTORY  Chief Complaint Mental Health Problem    HPI Bradley Casey is a 30 y.o. male  here with paranoia.  History is somewhat limited as patient appears intoxicated on my assessment.  He states he is here because he feels like people been out to get him.  He states he has felt very unsafe.  When asked why or who was out to get him, he is somewhat evasive.  He then reiterates that I do not understand that people are out to get him.  He denies overt suicidal or homicidal ideation though he states he cannot live "like this."  He does admit to cocaine use and marijuana use.  No other complaints.  Remainder of history limited due to intoxication.    History reviewed. No pertinent past medical history.  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 No pertinent surgical history.  Prior to Admission medications   Medication Sig Start Date End Date Taking? Authorizing Provider  feeding supplement (ENSURE ENLIVE / ENSURE PLUS) LIQD Take 237 mLs by mouth 3 (three) times daily between meals. 12/09/20   Raiford Noble Latif, DO  Multiple Vitamin  (MULTIVITAMIN WITH MINERALS) TABS tablet Take 1 tablet by mouth once daily. 12/10/20   Sheikh, Omair Latif, DO  OLANZapine (ZYPREXA) 7.5 MG tablet Take 1 tablet (7.5 mg total) by mouth at bedtime. 01/25/19   Money, Lowry Ram, FNP    Allergies Patient has no known allergies.  No family history on file.  Social History Social History   Tobacco Use   Smoking status: Some Days   Smokeless tobacco: Never  Vaping Use   Vaping Use: Never used  Substance Use Topics   Alcohol use: Yes   Drug use: Yes    Types: Marijuana, Cocaine    Review of Systems  Review of Systems  Constitutional:  Negative for chills and fever.  HENT:  Negative for sore throat.   Respiratory:  Negative for shortness of breath.   Cardiovascular:  Negative for chest pain.  Gastrointestinal:  Negative for abdominal pain.  Genitourinary:  Negative for flank pain.  Musculoskeletal:  Negative for neck pain.  Skin:  Negative for rash and wound.  Allergic/Immunologic: Negative for immunocompromised state.  Neurological:  Negative for weakness and numbness.  Hematological:  Does not bruise/bleed easily.  Psychiatric/Behavioral:  Positive for decreased concentration and dysphoric mood.     ____________________________________________  PHYSICAL EXAM:      VITAL SIGNS: ED Triage Vitals  Enc Vitals Group     BP 01/29/21 0122 (!) 184/100     Pulse Rate 01/29/21 0122 (!) 106     Resp 01/29/21 0122 18     Temp 01/29/21 0122 98 F (36.7 C)  Temp Source 01/29/21 0122 Oral     SpO2 01/29/21 0122 96 %     Weight 01/29/21 0122 165 lb (74.8 kg)     Height 01/29/21 0122 6' (1.829 m)     Head Circumference --      Peak Flow --      Pain Score 01/29/21 0117 0     Pain Loc --      Pain Edu? --      Excl. in [place]? --      Physical Exam Vitals and nursing note reviewed.  Constitutional:      General: He is not in acute distress.    Appearance: He is well-developed.  HENT:     Head: Normocephalic and atraumatic.   Eyes:     Conjunctiva/sclera: Conjunctivae normal.     Comments: Pupils dilated but reactive bilaterally  Cardiovascular:     Rate and Rhythm: Normal rate and regular rhythm.     Heart sounds: Normal heart sounds.  Pulmonary:     Effort: Pulmonary effort is normal. No respiratory distress.     Breath sounds: No wheezing.  Abdominal:     General: There is no distension.  Musculoskeletal:     Cervical back: Neck supple.  Skin:    General: Skin is warm.     Capillary Refill: Capillary refill takes less than 2 seconds.     Findings: No rash.  Neurological:     Mental Status: He is alert and oriented to person, place, and time.     Motor: No abnormal muscle tone.  Psychiatric:     Comments: Seemed paranoid, frequently looking at other people making noises in the ED.  Somewhat evasive on questioning.      ____________________________________________   LABS (all labs ordered are listed, but only abnormal results are displayed)  Labs Reviewed  URINE DRUG SCREEN, QUALITATIVE ([place]) - Abnormal; Notable for the following components:      Result Value   Cocaine Metabolite,Ur [place] POSITIVE (*)    Cannabinoid 50 Ng, Ur [name] POSITIVE (*)    All other components within normal limits  BASIC METABOLIC PANEL - Abnormal; Notable for the following components:   Sodium 134 (*)    Chloride 97 (*)    Glucose, Bld 123 (*)    All other components within normal limits  SALICYLATE LEVEL - Abnormal; Notable for the following components:   Salicylate Lvl [id number] (*)    All other components within normal limits  ACETAMINOPHEN LEVEL - Abnormal; Notable for the following components:   Acetaminophen (Tylenol), Serum <10 (*)    All other components within normal limits  CBC  ETHANOL    ____________________________________________  EKG:  ________________________________________  RADIOLOGY All imaging, including plain films, CT scans, and ultrasounds, independently reviewed by me, and  interpretations confirmed via formal radiology reads.  ED MD interpretation:     Official radiology report(s): No results found.  ____________________________________________  PROCEDURES   Procedure(s) performed (including Critical Care):  Procedures  ____________________________________________  INITIAL IMPRESSION / MDM / [place] / ED COURSE  As part of my medical decision making, I reviewed the following data within the [place] notes reviewed and incorporated, Old chart reviewed, Notes from prior ED visits, and [place] Controlled Substance Database       *Melvis Belleau was evaluated in Emergency Department on 01/29/2021 for the symptoms described in the history of present illness. He was evaluated in the context of the [place] COVID-19  pandemic, which necessitated consideration that the patient might be at risk for infection with the SARS-CoV-2 virus that causes COVID-19. Institutional protocols and algorithms that pertain to the evaluation of patients at risk for COVID-19 are in a state of rapid change based on information released by regulatory bodies including the CDC and federal and state organizations. These policies and algorithms were followed during the patient's care in the ED.  Some ED evaluations and interventions may be delayed as a result of limited staffing during the pandemic.*     Medical Decision Making:  30 yo M here with paranoia, which I suspect is related to substance-induced psychosis/mood disorder. Pt somewhat resistant to providing additional history here. Will give ativan, reassess once sober. Labs o/w reassuring. UDS + cocaine, THC. Denies overt SI, HI on my interview.  The patient has been placed in psychiatric observation due to the need to provide a safe environment for the patient while obtaining psychiatric consultation and evaluation, as well as ongoing medical and medication management to treat the patient's  condition.  The patient has not been placed under full IVC at this time.   ____________________________________________  FINAL CLINICAL IMPRESSION(S) / ED DIAGNOSES  Final diagnoses:  Polysubstance abuse (HCC)     MEDICATIONS GIVEN DURING THIS VISIT:  Medications  LORazepam (ATIVAN) tablet 1 mg (1 mg Oral Given 01/29/21 0234)     ED Discharge Orders     None        Note:  This document was prepared using Dragon voice recognition software and may include unintentional dictation errors.   Shaune Pollack, MD 01/29/21 [phone]

## 2021-01-29 NOTE — ED Notes (Signed)
Pt dressed out in wine scrubs. 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

## 2021-01-29 NOTE — ED Notes (Signed)
Hospital meal provided, pt tolerated w/o complaints.  Waste discarded appropriately.  

## 2021-01-29 NOTE — Progress Notes (Signed)
Pt is a 30 yr old male with a hx of substance abuse. Pt denies being SI/AVH however endorse HI at this time. Pt give no details as to whom his HI towards or that he has a plan. Pt states he feels safe while he is here. Pt denies experiencing any pain at this time. Pt's skin assessment was preformed upon admission and has bruising on his lower back area along with abrasions on his left knee area. Pt shares that he was intimate with a witch a few times and then stopped and now things have been happening to him and people are after him. Pt states that they're trying to kill him and either they kill him or he kills them. Pt also talked about how during his last admission here he met a girl who said she was an alien and wanted to take him to another planet and repopulate it with him. Pt was calm and cooperative during admission.

## 2021-01-29 NOTE — ED Triage Notes (Signed)
Patient ambulatory to triage with steady gait, without difficulty or distress noted; pt reports feeling "it's not safe anywhere, there have been attempts on my life"; pt denies any SI or hx of same

## 2021-01-29 NOTE — BH Assessment (Incomplete)
Comprehensive Clinical Assessment (CCA) Note  01/29/2021 Bradley Casey [id number]  Chief Complaint:  Chief Complaint  Patient presents with   Mental Health Problem   Visit Diagnosis: ***    CCA Screening, Triage and Referral (STR)  Patient Reported Information How did you hear about [place]? Self  What Is the Reason for Your Visit/Call Today? Patient paranoid and believe people are out to harm him.  How Long Has This Been Causing You Problems? 1 wk - 1 month  What Do You Feel Would Help You the Most Today? Treatment for Depression or other mood problem   Have You Recently Had Any Thoughts About Hurting Yourself? No  Are You Planning to Commit Suicide/Harm Yourself At This time? No   Have you Recently Had Thoughts About Hurting Someone Bradley Casey? No  Are You Planning to Harm Someone at This Time? No  Explanation: No data recorded  Have You Used Any Alcohol or Drugs in the Past 24 Hours? Yes  How Long Ago Did You Use Drugs or Alcohol? No data recorded What Did You Use and How Much? Cocaine   Do You Currently Have a Therapist/Psychiatrist? No  Name of Therapist/Psychiatrist: No data recorded  Have You Been Recently Discharged From Any Office Practice or Programs? No  Explanation of Discharge From Practice/Program: No data recorded    CCA Screening Triage Referral Assessment Type of Contact: Face-to-Face  Telemedicine Service Delivery:   Is this Initial or Reassessment? No data recorded Date Telepsych consult ordered in CHL:  No data recorded Time Telepsych consult ordered in CHL:  No data recorded Location of Assessment: [facility] ED  Provider Location: [facility] ED   Collateral Involvement: Patient didn't want staff to contact anyone   Does Patient Have a Court Appointed Legal Guardian? No data recorded Name and Contact of Legal Guardian: No data recorded If Minor and Not Living with Parent(s), Who has Custody? No data recorded Is CPS involved or ever been involved?  Never  Is APS involved or ever been involved? Never   Patient Determined To Be At Risk for Harm To Self or Others Based on Review of Patient Reported Information or Presenting Complaint? No  Method: No data recorded Availability of Means: No data recorded Intent: No data recorded Notification Required: No data recorded Additional Information for Danger to Others Potential: No data recorded Additional Comments for Danger to Others Potential: No data recorded Are There Guns or Other Weapons in Your Home? No data recorded Types of Guns/Weapons: No data recorded Are These Weapons Safely Secured?                            No data recorded Who Could Verify You Are Able To Have These Secured: No data recorded Do You Have any Outstanding Charges, Pending Court Dates, Parole/Probation? No data recorded Contacted To Inform of Risk of Harm To Self or Others: No data recorded   Does Patient Present under Involuntary Commitment? No  IVC Papers Initial File Date: No data recorded  [place] of Residence: [place]   Patient Currently Receiving the Following Services: Not Receiving Services   Determination of Need: Emergent (2 hours)   Options For Referral: ED Visit; Inpatient Hospitalization     CCA Biopsychosocial Patient Reported Schizophrenia/Schizoaffective Diagnosis in Past: No   Strengths: He tries to express what he needs   Mental Health Symptoms Depression:   Difficulty Concentrating; Hopelessness   Duration of Depressive symptoms:  Duration of Depressive Symptoms:  Greater than two weeks   Mania:   Racing thoughts; Change in energy/activity   Anxiety:    Restlessness; Tension; Worrying   Psychosis:   Delusions   Duration of Psychotic symptoms:  Duration of Psychotic Symptoms: Less than six months   Trauma:   N/A   Obsessions:   N/A   Compulsions:   N/A   Inattention:   N/A   Hyperactivity/Impulsivity:   N/A   Oppositional/Defiant Behaviors:    N/A   Emotional Irregularity:   N/A   Other Mood/Personality Symptoms:  No data recorded   Mental Status Exam Appearance and self-care  Stature:   Average   Weight:   Average weight   Clothing:   Neat/clean; Age-appropriate   Grooming:   Normal   Cosmetic use:   None   Posture/gait:   Normal   Motor activity:   -- (Within normal range)   Sensorium  Attention:   Confused; Distractible   Concentration:   Anxiety interferes; Scattered   Orientation:   Place; Person; Object   Recall/memory:   Normal   Affect and Mood  Affect:   Anxious; Full Range   Mood:   Anxious   Relating  Eye contact:   Normal   Facial expression:   Responsive; Anxious   Attitude toward examiner:   Cooperative   Thought and Language  Speech flow:  Blocked   Thought content:   Delusions; Persecutions   Preoccupation:   Obsessions   Hallucinations:   None   Organization:  No data recorded  [employer] of Knowledge:   Fair   Intelligence:   Average   Abstraction:   Concrete   Judgement:   Impaired   Reality Testing:   Distorted   Insight:   Fair   Decision Making:   Impulsive; Confused   Social Functioning  Social Maturity:   Impulsive; Isolates   Social Judgement:   Impropriety; "Street Smart"   Stress  Stressors:   Family conflict; Relationship; Other (Comment)   Coping Ability:   Exhausted   Skill Deficits:   None   Supports:   Friends/Service system     Religion: Religion/Spirituality Are You A Religious Person?: No  Leisure/Recreation: Leisure / Recreation Do You Have Hobbies?: No  Exercise/Diet: Exercise/Diet Do You Exercise?: No Have You Gained or Lost A Significant Amount of Weight in the Past Six Months?: No Do You Follow a Special Diet?: No Do You Have Any Trouble Sleeping?: No   CCA Employment/Education Employment/Work Situation: Employment / Work Situation Employment Situation:  Unemployed Patient's Job has Been Impacted by Current Illness: No  Education: Education Is Patient Currently Attending School?: No Did You Have An Individualized Education Program (IIEP): No Did You Have Any Difficulty At [employer]?: No Patient's Education Has Been Impacted by Current Illness: No   CCA Family/Childhood History Family and Relationship History: Family history Marital status: Single Does patient have children?: No  Childhood History:  Childhood History By whom was/is the patient raised?: Both parents Did patient suffer any verbal/emotional/physical/sexual abuse as a child?: No Did patient suffer from severe childhood neglect?: No Has patient ever been sexually abused/assaulted/raped as an adolescent or adult?: No Was the patient ever a victim of a crime or a disaster?: No Witnessed domestic violence?: No Has patient been affected by domestic violence as an adult?: No  Child/Adolescent Assessment:     CCA Substance Use Alcohol/Drug Use: Alcohol / Drug Use Pain Medications: See PTA Prescriptions: See PTA Over the  Counter: See PTA History of alcohol / drug use?: Yes Substance #1 Name of Substance 1: Cocaine Substance #2 Name of Substance 2: Cannabis    ASAM's:  Six Dimensions of Multidimensional Assessment  Dimension 1:  Acute Intoxication and/or Withdrawal Potential:      Dimension 2:  Biomedical Conditions and Complications:      Dimension 3:  Emotional, Behavioral, or Cognitive Conditions and Complications:     Dimension 4:  Readiness to Change:     Dimension 5:  Relapse, Continued use, or Continued Problem Potential:     Dimension 6:  Recovery/Living Environment:     ASAM Severity Score:    ASAM Recommended Level of Treatment:     Substance use Disorder (SUD)    Recommendations for Services/Supports/Treatments:    Discharge Disposition:    DSM5 Diagnoses: Patient Active Problem List   Diagnosis Date Noted   Acute respiratory failure  ([place]) 12/07/2020   Endotracheally intubated 12/07/2020   On mechanically assisted ventilation ([place]) 12/07/2020   AKI (acute kidney injury) ([place]) 12/07/2020   Increased anion gap metabolic acidosis 123XX123   Lactic acidosis 12/07/2020   Drug-induced psychotic disorder, with unspecified complication ([place]) [id number]   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

## 2021-01-29 NOTE — ED Notes (Signed)
Patient provided snack at appropriate snack time.  Pt consumed 100% of snack provided, tolerated well w/o complaints   Trash disposted of appropriately by patient.  
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
## 2021-01-29 NOTE — BH Assessment (Signed)
Pt was unable to participate in the assessment due to inability to stay awake. TTS to follow up.

## 2021-01-29 NOTE — Tx Team (Signed)
Initial Treatment Plan 01/29/2021 6:32 PM Burrell Hodapp OIZ:[id number]    PATIENT STRESSORS: Loss of support system   Substance abuse     PATIENT STRENGTHS: Motivation for treatment/growth  Physical Health    PATIENT IDENTIFIED PROBLEMS: Ineffective coping skills  Substance abuse                   DISCHARGE CRITERIA:  Improved stabilization in mood, thinking, and/or behavior Motivation to continue treatment in a less acute level of care  PRELIMINARY DISCHARGE PLAN: Attend 12-step recovery group Outpatient therapy Placement in alternative living arrangements  PATIENT/FAMILY INVOLVEMENT: This treatment plan has been presented to and reviewed with the patient, Erlin Gardella, and/or family member.  The patient and family have been given the opportunity to ask questions and make suggestions.  Sharin Mons, RN 01/29/2021, 6:32 PM

## 2021-01-29 NOTE — ED Notes (Signed)
beverage given.  

## 2021-01-29 NOTE — ED Notes (Addendum)
Shower supplies provided. Pt is currently taking a shower. Linen changed and new beh health provided.

## 2021-01-30 DIAGNOSIS — F14951 Cocaine use, unspecified with cocaine-induced psychotic disorder with hallucinations: Secondary | ICD-10-CM

## 2021-01-30 MED ORDER — LORAZEPAM 1 MG PO TABS
1.0000 mg | ORAL_TABLET | Freq: Two times a day (BID) | ORAL | Status: DC
  Administered 2021-01-31 – 2021-02-01 (×3): 1 mg via ORAL
  Filled 2021-01-30 (×4): qty 1

## 2021-01-30 MED ORDER — FLUOXETINE HCL 20 MG PO CAPS
20.0000 mg | ORAL_CAPSULE | Freq: Every day | ORAL | Status: DC
  Administered 2021-01-31 – 2021-02-03 (×4): 20 mg via ORAL
  Filled 2021-01-30 (×5): qty 1

## 2021-01-30 MED ORDER — OLANZAPINE 10 MG PO TABS
10.0000 mg | ORAL_TABLET | Freq: Every day | ORAL | Status: DC
  Administered 2021-01-30 – 2021-01-31 (×2): 10 mg via ORAL
  Filled 2021-01-30 (×2): qty 1

## 2021-01-30 NOTE — Progress Notes (Signed)
Patient alert and oriented x 4, affect is flat and he brightens upon approach, denies SI/HI/AVH, complaint with medication regimen, no distress noted, offered emotional support, 15 minutes safety checks maintained.

## 2021-01-30 NOTE — Progress Notes (Signed)
Pt is A & O x4.  He was pleasant throughout the day.  He was compliant with medication regimen.  He denies SI/HI/AVH.  He ate his meals and had limited interaction with other individuals on the unit.  Reports no distress. Pt reports he feels safe on the [facility] unit. Reported he slept 7.15 hours the night prior. 15 minutes safety checks was maintained.
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
## 2021-01-30 NOTE — [facility] Suicide Risk Assessment (Signed)
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 male with a history of polysubstance abuse and paranoid delusions who comes to the ED complaining of increased paranoia over the past 3 days.  Last cocaine use was earlier today and marijuana use is ongoing.  States that after using cocaine most recently his paranoia has worsened. Denies SI or HI, denies hallucinations. Had previously been prescribed olanzapine but is not taking it.  Continued Clinical Symptoms:  Alcohol Use Disorder Identification Test Final Score (AUDIT): 1 The "Alcohol Use Disorders Identification Test", Guidelines for Use in Primary Care, Second Edition.  World [employer] writer [facility]). Score between 0-7:  no or low risk or alcohol related problems. Score between 8-15:  moderate risk of alcohol related problems. Score between 16-19:  high risk of alcohol related problems. Score 20 or above:  warrants further diagnostic evaluation for alcohol dependence and treatment.   CLINICAL FACTORS:   Depression:   Delusional Personality Disorders:   Cluster B   Musculoskeletal: Strength & Muscle Tone: within normal limits Gait & Station: normal Patient leans: N/A  Psychiatric Specialty Exam:  Presentation  General Appearance: Appropriate for Environment  Eye Contact:Good  Speech:Clear and Coherent  Speech Volume:Normal  Handedness:Right   Mood and Affect  Mood:Anxious; Depressed  Affect:Blunt; Congruent   Thought Process   Thought Processes:Coherent  Descriptions of Associations:Intact  Orientation:Full (Time, Place and Person)  Thought Content:Logical  History of Schizophrenia/Schizoaffective disorder:No  Duration of Psychotic Symptoms:Less than six months  Hallucinations:No data recorded Ideas of Reference:Paranoia  Suicidal Thoughts:No data recorded Homicidal Thoughts:No data recorded  Sensorium  Memory:Immediate Fair; Recent Fair; Remote Fair  Judgment:Fair  Insight:Fair   Executive Functions  Concentration:Fair  Attention Span:Fair  Recall:Fair  Fund of Knowledge:Fair  Language:Fair   Psychomotor Activity  Psychomotor Activity:No data recorded  Assets  Assets:Communication Skills; Desire for Improvement; Intimacy; Leisure Time; Resilience; Social Support   Sleep  Sleep:No data recorded   Physical Exam: Physical Exam Vitals and nursing note reviewed.  Constitutional:      Appearance: Normal appearance. He is normal weight.  Neurological:     General: No focal deficit present.     Mental Status: He is alert and oriented to person, place, and time.  Psychiatric:        Attention and Perception: Attention and perception normal.        Mood and Affect: Mood is anxious and depressed. Affect is flat.        Speech: Speech normal.        Behavior: Behavior normal. Behavior is cooperative.        Thought Content: Thought content is paranoid.        Cognition and Memory: Cognition and memory normal.        Judgment: Judgment normal.   Review of Systems  Constitutional: Negative.   HENT: Negative.    Eyes: Negative.   Respiratory: Negative.    Cardiovascular: Negative.   Gastrointestinal: Negative.   Genitourinary: Negative.   Musculoskeletal: Negative.   Skin: Negative.   Neurological: Negative.   Endo/Heme/Allergies:  Negative.   Psychiatric/Behavioral:  Positive for depression and substance abuse.   Blood pressure (!) 166/118, pulse 88, temperature 97.7 F  (36.5 C), temperature source Oral, resp. rate 18, height 6' (1.829 m), weight 69.4 kg, SpO2 100 %. Body mass index is 20.75 kg/m.   COGNITIVE FEATURES THAT CONTRIBUTE TO RISK:  Closed-mindedness    SUICIDE RISK:   Mild:  Suicidal ideation of limited frequency, intensity, duration, and specificity.  There are no identifiable plans, no associated intent, mild dysphoria and related symptoms, good self-control (both objective and subjective assessment), few other risk factors, and identifiable protective factors, including available and accessible social support.  PLAN OF CARE: See orders  I certify that inpatient services furnished can reasonably be expected to improve the patient's condition.   Sarina Ill, DO 01/30/2021, 11:39 AM

## 2021-01-30 NOTE — [facility] Suicide Risk Assessment (Signed)
[facility] INPATIENT:  Family/Significant Other Suicide Prevention Education  Suicide Prevention Education:  Patient Refusal for Family/Significant Other Suicide Prevention Education: The patient Bradley Casey has refused to provide written consent for family/significant other to be provided Family/Significant Other Suicide Prevention Education during admission and/or prior to discharge.  Physician notified.  SPE completed with pt, as pt refused to consent to family contact. SPI pamphlet provided to pt and pt was encouraged to share information with support network, ask questions, and talk about any concerns relating to SPE. Pt denies access to guns/firearms and verbalized understanding of information provided. Mobile Crisis information also provided to pt.    Ileana Ladd Bradley Casey 01/30/2021, 5:37 PM

## 2021-01-30 NOTE — H&P (Signed)
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  He states that his cousin dropped him off after he was getting paranoid that his roommate was out to get him.  He has been staying with this roommate for a while along with his cousin.  He is not taking any medications and states that he does not need any help with his cocaine or marijuana use.  He states that he is depressed because of the feeling that someone is out to get him.  He was recently admitted to the adult inpatient unit.  He is agreeable to medication management.  He denies any suicidal ideation but states that he has homicidally ideation but to no one in particular.  PER RN INTAKE: Pt is a 30 yr old male with a hx of substance abuse. Pt denies being SI/AVH however endorse HI at this time. Pt give no details as to whom his HI towards or that he has a plan. Pt states he feels safe while he is here. Pt denies experiencing any pain at this time. Pt's skin assessment was preformed upon admission and has bruising on his lower back area along with abrasions on his left knee area. Pt shares that he was intimate with a witch a few times and then stopped and now things have been happening to him and people are after him. Pt states that they're trying to kill him and either they kill him or he kills them.    Note Details  Author Sharin Mons, RN File Time 01/29/2021  6:46 PM  Author Type Registered Nurse Status Signed  Last Editor Sharin Mons,  RN Service (none)  Hospital Acct # [id number] Admit Date 01/29/2021   Associated Signs/Symptoms: Depression Symptoms:  depressed mood, Duration of Depression Symptoms: Greater than two weeks  (Hypo) Manic Symptoms:  Delusions, Anxiety Symptoms:  Excessive Worry, Psychotic Symptoms:  Paranoia, PTSD Symptoms: NA Total Time spent with patient: 1 hour  Past Psychiatric History: Patient insists he has had no previous psychiatric treatment whatsoever.  Never been on any medication.  Never been seen by a therapist or psychiatrist in the past.  No history of suicide attempts.  Denies violence.  Is the patient at risk to self? No.  Has the patient been a risk to self in the past 6 months? No.  Has the patient been a risk to self within the distant past? Yes.    Is the patient a risk to others? Yes.    Has the patient been a risk to others in the past 6 months? Yes.    Has the patient been a risk to others within the distant past? Yes.     Prior Inpatient Therapy:   Prior Outpatient Therapy:    Alcohol Screening: 1. How often do you have a drink containing alcohol?: Monthly or less 2. How many drinks containing alcohol do you have on a typical day when you are drinking?: 1 or 2 3. How often do you have six or more drinks on one occasion?: Never AUDIT-C Score: 1 4. How often during the last year have  you found that you were not able to stop drinking once you had started?: Never 5. How often during the last year have you failed to do what was normally expected from you because of drinking?: Never 6. How often during the last year have you needed a first drink in the morning to get yourself going after a heavy drinking session?: Never 7. How often during the last year have you had a feeling of guilt of remorse after drinking?: Never 8. How often during the last year have you been unable to remember what happened the night before because you had been drinking?: Never 9. Have you or someone else  been injured as a result of your drinking?: No 10. Has a relative or friend or a doctor or another health worker been concerned about your drinking or suggested you cut down?: No Alcohol Use Disorder Identification Test Final Score (AUDIT): 1 Substance Abuse History in the last 12 months:  Yes.   Consequences of Substance Abuse: NA Previous Psychotropic Medications: Yes  Psychological Evaluations: No  Past Medical History: History reviewed. No pertinent past medical history. History reviewed. No pertinent surgical history. Family History: History reviewed. No pertinent family history. Family Psychiatric  History: Unremarkable Tobacco Screening:   Social History:  Social History   Substance and Sexual Activity  Alcohol Use Yes     Social History   Substance and Sexual Activity  Drug Use Yes   Types: Marijuana, Cocaine    Additional Social History: Marital status: Single Are you sexually active?: Yes What is your sexual orientation?: Heterosexual Has your sexual activity been affected by drugs, alcohol, medication, or emotional stress?: Patient denies Does patient have children?: Yes How many children?: 1 How is patient's relationship with their children?: "Good"                         Allergies:  No Known Allergies Lab Results: No results found for this or any previous visit (from the past 48 hour(s)).  Blood Alcohol level:  Lab Results  Component Value Date   ETH <10 01/29/2021   ETH <10 12/06/2020    Metabolic Disorder Labs:  No results found for: HGBA1C, MPG No results found for: PROLACTIN Lab Results  Component Value Date   CHOL 160 01/22/2019   TRIG 80 01/22/2019   HDL 61 01/22/2019   CHOLHDL 2.6 01/22/2019   VLDL 16 01/22/2019   LDLCALC 83 01/22/2019    Current Medications: Current Facility-Administered Medications  Medication Dose Route Frequency Provider Last Rate Last Admin   acetaminophen (TYLENOL) tablet 650 mg  650 mg Oral Q6H PRN  Charm Rings, NP       alum & mag hydroxide-simeth (MAALOX/MYLANTA) 200-200-20 MG/5ML suspension 30 mL  30 mL Oral Q4H PRN Charm Rings, NP       benztropine (COGENTIN) tablet 0.5 mg  0.5 mg Oral Daily Shaune Pollack, Jamison Y, NP   0.5 mg at 01/30/21 0825   feeding supplement (ENSURE ENLIVE / ENSURE PLUS) liquid 237 mL  237 mL Oral TID BM Charm Rings, NP   237 mL at 01/30/21 1014   gabapentin (NEURONTIN) capsule 300 mg  300 mg Oral TID Charm Rings, NP   300 mg at 01/30/21 0825   haloperidol (HALDOL) tablet 2 mg  2 mg Oral BID Charm Rings, NP   2 mg at 01/30/21 0825   magnesium hydroxide (MILK OF MAGNESIA) suspension 30 mL  30 mL Oral  Daily PRN Charm Rings, NP       multivitamin with minerals tablet 1 tablet  1 tablet Oral Daily Charm Rings, NP   1 tablet at 01/30/21 0240   PTA Medications: Medications Prior to Admission  Medication Sig Dispense Refill Last Dose   feeding supplement (ENSURE ENLIVE / ENSURE PLUS) LIQD Take 237 mLs by mouth 3 (three) times daily between meals. 237 mL 12    Multiple Vitamin (MULTIVITAMIN WITH MINERALS) TABS tablet Take 1 tablet by mouth once daily. (Patient not taking: Reported on 01/29/2021) 30 tablet 0    OLANZapine (ZYPREXA) 7.5 MG tablet Take 1 tablet (7.5 mg total) by mouth at bedtime. (Patient not taking: Reported on 01/29/2021) 30 tablet 1     Musculoskeletal: Strength & Muscle Tone: within normal limits Gait & Station: normal Patient leans: N/A            Psychiatric Specialty Exam:  Presentation  General Appearance: Appropriate for Environment  Eye Contact:Good  Speech:Clear and Coherent  Speech Volume:Normal  Handedness:Right   Mood and Affect  Mood:Anxious; Depressed  Affect:Blunt; Congruent   Thought Process  Thought Processes:Coherent  Duration of Psychotic Symptoms: Less than six months  Past Diagnosis of Schizophrenia or Psychoactive disorder: No  Descriptions of  Associations:Intact  Orientation:Full (Time, Place and Person)  Thought Content:Logical  Hallucinations:No data recorded Ideas of Reference:Paranoia  Suicidal Thoughts:No data recorded Homicidal Thoughts:No data recorded  Sensorium  Memory:Immediate Fair; Recent Fair; Remote Fair  Judgment:Fair  Insight:Fair   Executive Functions  Concentration:Fair  Attention Span:Fair  Recall:Fair  Fund of Knowledge:Fair  Language:Fair   Psychomotor Activity  Psychomotor Activity:No data recorded  Assets  Assets:Communication Skills; Desire for Improvement; Intimacy; Leisure Time; Resilience; Social Support   Sleep  Sleep:No data recorded   Physical Exam: Physical Exam Vitals and nursing note reviewed.  Constitutional:      Appearance: Normal appearance. He is normal weight.  HENT:     Head: Normocephalic and atraumatic.     Nose: Nose normal.     Mouth/Throat:     Pharynx: Oropharynx is clear.  Eyes:     Extraocular Movements: Extraocular movements intact.     Pupils: Pupils are equal, round, and reactive to light.  Cardiovascular:     Rate and Rhythm: Normal rate and regular rhythm.     Pulses: Normal pulses.     Heart sounds: Normal heart sounds.  Pulmonary:     Effort: Pulmonary effort is normal.     Breath sounds: Normal breath sounds.  Abdominal:     General: Abdomen is flat. Bowel sounds are normal.     Palpations: Abdomen is soft.  Musculoskeletal:        General: Normal range of motion.     Cervical back: Normal range of motion and neck supple.  Skin:    General: Skin is warm and dry.  Neurological:     General: No focal deficit present.     Mental Status: He is alert and oriented to person, place, and time.  Psychiatric:        Attention and Perception: Attention and perception normal.        Mood and Affect: Mood normal.        Speech: Speech normal.        Behavior: Behavior normal. Behavior is cooperative.        Thought Content: Thought  content is paranoid and delusional.        Cognition and Memory: Cognition normal.  Judgment: Judgment is impulsive.   Review of Systems  Constitutional: Negative.   HENT: Negative.    Eyes: Negative.   Respiratory: Negative.    Cardiovascular: Negative.   Gastrointestinal: Negative.   Genitourinary: Negative.   Musculoskeletal: Negative.   Skin: Negative.   Neurological: Negative.   Endo/Heme/Allergies: Negative.   Psychiatric/Behavioral:  Positive for depression and substance abuse.   Blood pressure (!) 166/118, pulse 88, temperature 97.7 F (36.5 C), temperature source Oral, resp. rate 18, height 6' (1.829 m), weight 69.4 kg, SpO2 100 %. Body mass index is 20.75 kg/m.  Treatment Plan Summary: Daily contact with patient to assess and evaluate symptoms and progress in treatment, Medication management, and Plan start Ativan 1 mg twice a day for possible withdrawal and hypertension.  Zyprexa 10 mg at bedtime and Prozac 20 mg in the morning.  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) Long Term Goal(s): Improvement in symptoms so as ready for discharge  Short Term Goals: Ability to identify changes in lifestyle to reduce recurrence of condition will improve, Ability to verbalize feelings will improve, Ability to disclose and discuss suicidal ideas, Ability to demonstrate self-control will improve, Ability to identify and develop effective coping behaviors will improve, Ability to maintain clinical measurements within normal limits will improve, Compliance with prescribed medications will improve, and Ability to identify triggers associated with substance abuse/mental health issues will improve  Physician Treatment  Plan for Secondary Diagnosis: Principal Problem:   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    Sarina Ill, DO 12/10/202211:42 AM

## 2021-01-30 NOTE — Group Note (Signed)
LCSW Group Therapy Note  Group Date: 01/30/2021 Start Time: 1320 End Time: 1420   Type of Therapy and Topic:  Group Therapy - Healthy vs Unhealthy Coping Skills  Participation Level:  Did Not Attend   Description of Group The focus of this group was to determine what unhealthy coping techniques typically are used by group members and what healthy coping techniques would be helpful in coping with various problems. Patients were guided in becoming aware of the differences between healthy and unhealthy coping techniques. Patients were asked to identify 2-3 healthy coping skills they would like to learn to use more effectively.  Therapeutic Goals Patients learned that coping is what human beings do all day long to deal with various situations in their lives Patients defined and discussed healthy vs unhealthy coping techniques Patients identified their preferred coping techniques and identified whether these were healthy or unhealthy Patients determined 2-3 healthy coping skills they would like to become more familiar with and use more often. Patients provided support and ideas to each other   Summary of Patient Progress: Patient did not attend group despite encouraged participation.    Therapeutic Modalities Cognitive Behavioral Therapy Motivational Interviewing  Marletta Lor 01/30/2021  4:37 PM

## 2021-01-31 NOTE — Progress Notes (Signed)
Patient compliant with medications denies SI/HI/A/VH at present. Support and encouragement provided. Q15 minutes safety checks ongoing. Patient remains safe.

## 2021-01-31 NOTE — Group Note (Signed)
LCSW Group Therapy Note  Group Date: 01/31/2021 Start Time: 1315 End Time: 1400   Type of Therapy and Topic:  Group Therapy - How To Cope with Nervousness about Discharge   Participation Level:  Did Not Attend   Description of Group This process group involved identification of patients' feelings about discharge. Some of them are scheduled to be discharged soon, while others are new admissions, but each of them was asked to share thoughts and feelings surrounding discharge from the hospital. One common theme was that they are excited at the prospect of going home, while another was that many of them are apprehensive about sharing why they were hospitalized. Patients were given the opportunity to discuss these feelings with their peers in preparation for discharge.  Therapeutic Goals  Patient will identify their overall feelings about pending discharge. Patient will think about how they might proactively address issues that they believe will once again arise once they get home (i.e. with parents). Patients will participate in discussion about having hope for change.   Summary of Patient Progress:  Patient did not attend group despite encouraged participation.   Therapeutic Modalities Cognitive Behavioral Therapy   Andrey Mccaskill K Mary-Ann Pennella, LCSWA 01/31/2021  4:54 PM   

## 2021-01-31 NOTE — Progress Notes (Signed)
[facility] MD Progress Note  01/31/2021 3:23 PM Bradley Casey  MRN:  [id number] Subjective: Bradley Casey presented after using cocaine marijuana and alcohol.  He presented paranoid and thought that his roommate was out to get him.  He is taking his medications as prescribed and denies any side effects.  There is no EPS or TD.  He denies any side effects. 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 No pertinent past medical history. History reviewed. No pertinent surgical history. Family History: History reviewed. No pertinent family history. Family Psychiatric  History: See H&P Social History:  Social History   Substance and Sexual Activity  Alcohol Use Yes     Social History   Substance and Sexual Activity  Drug Use Yes   Types: Marijuana, Cocaine    Social History   Socioeconomic History   Marital status: Single    Spouse name: Not on file   Number of children: Not on file   Years of education: Not on file   Highest education level: Not on file  Occupational History   Not on file  Tobacco Use   Smoking status: Some Days   Smokeless tobacco: Never  Vaping Use   Vaping Use: Never used  Substance and Sexual Activity   Alcohol use: Yes   Drug use: Yes    Types: Marijuana, Cocaine   Sexual activity: Yes  Other Topics Concern   Not on file  Social History Narrative   Not on file   Social Determinants of Health   Financial Resource Strain: Not on file  Food Insecurity: Not on file  Transportation Needs: Not on file  Physical Activity: Not on file  Stress: Not on file  Social Connections: Not on file   Additional Social History:                         Sleep: Good  Appetite:  Good  Current Medications: Current  Facility-Administered Medications  Medication Dose Route Frequency Provider Last Rate Last Admin   acetaminophen (TYLENOL) tablet 650 mg  650 mg Oral Q6H PRN Charm Rings, NP       alum & mag hydroxide-simeth (MAALOX/MYLANTA) 200-200-20 MG/5ML suspension 30 mL  30 mL Oral Q4H PRN Charm Rings, NP       benztropine (COGENTIN) tablet 0.5 mg  0.5 mg Oral Daily Shaune Pollack, Jamison Y, NP   0.5 mg at 01/31/21 0854   feeding supplement (ENSURE ENLIVE / ENSURE PLUS) liquid 237 mL  237 mL Oral TID BM Charm Rings, NP   237 mL at 01/31/21 1321   FLUoxetine (PROZAC) capsule 20 mg  20 mg Oral Daily Sarina Ill, DO   20 mg at 01/31/21 0854   gabapentin (NEURONTIN) capsule 300 mg  300 mg Oral TID Charm Rings, NP   300 mg at 01/31/21 1114   haloperidol (HALDOL) tablet 2 mg  2 mg Oral BID Charm Rings, NP   2 mg at 01/31/21 0854   LORazepam (ATIVAN) tablet 1 mg  1 mg Oral BID Sarina Ill, DO   1 mg at 01/31/21 0854   magnesium hydroxide (MILK OF MAGNESIA) suspension 30 mL  30 mL Oral Daily PRN Charm Rings, NP       multivitamin  with minerals tablet 1 tablet  1 tablet Oral Daily Charm Rings, NP   1 tablet at 01/31/21 0854   OLANZapine (ZYPREXA) tablet 10 mg  10 mg Oral QHS Sarina Ill, DO   10 mg at 01/30/21 2136    Lab Results: No results found for this or any previous visit (from the past 48 hour(s)).  Blood Alcohol level:  Lab Results  Component Value Date   ETH <10 01/29/2021   ETH <10 12/06/2020    Metabolic Disorder Labs: No results found for: HGBA1C, MPG No results found for: PROLACTIN Lab Results  Component Value Date   CHOL 160 01/22/2019   TRIG 80 01/22/2019   HDL 61 01/22/2019   CHOLHDL 2.6 01/22/2019   VLDL 16 01/22/2019   LDLCALC 83 01/22/2019    Physical Findings: AIMS:  , ,  ,  ,    CIWA:    COWS:     Musculoskeletal: Strength & Muscle Tone: within normal limits Gait & Station: normal Patient leans: N/A  Psychiatric  Specialty Exam:  Presentation  General Appearance: Appropriate for Environment  Eye Contact:Good  Speech:Clear and Coherent  Speech Volume:Normal  Handedness:Right   Mood and Affect  Mood:Anxious; Depressed  Affect:Blunt; Congruent   Thought Process  Thought Processes:Coherent  Descriptions of Associations:Intact  Orientation:Full (Time, Place and Person)  Thought Content:Logical  History of Schizophrenia/Schizoaffective disorder:No  Duration of Psychotic Symptoms:Less than six months  Hallucinations:No data recorded Ideas of Reference:Paranoia  Suicidal Thoughts:No data recorded Homicidal Thoughts:No data recorded  Sensorium  Memory:Immediate Fair; Recent Fair; Remote Fair  Judgment:Fair  Insight:Fair   Executive Functions  Concentration:Fair  Attention Span:Fair  Recall:Fair  Fund of Knowledge:Fair  Language:Fair   Psychomotor Activity  Psychomotor Activity:No data recorded  Assets  Assets:Communication Skills; Desire for Improvement; Intimacy; Leisure Time; Resilience; Social Support   Sleep  Sleep:No data recorded   Physical Exam: Physical Exam Vitals and nursing note reviewed.  Constitutional:      Appearance: Normal appearance. He is normal weight.  Neurological:     General: No focal deficit present.     Mental Status: He is alert and oriented to person, place, and time.  Psychiatric:        Attention and Perception: Attention and perception normal.        Mood and Affect: Mood is anxious.        Speech: Speech normal.        Behavior: Behavior is withdrawn. Behavior is cooperative.        Thought Content: Thought content is paranoid.        Cognition and Memory: Cognition and memory normal.        Judgment: Judgment is impulsive and inappropriate.   Review of Systems  Constitutional: Negative.   HENT: Negative.    Eyes: Negative.   Respiratory: Negative.    Cardiovascular: Negative.   Gastrointestinal: Negative.    Genitourinary: Negative.   Musculoskeletal: Negative.   Skin: Negative.   Neurological: Negative.   Endo/Heme/Allergies: Negative.   Psychiatric/Behavioral:  Positive for substance abuse. The patient is nervous/anxious.   Blood pressure (!) 143/98, pulse 98, temperature 97.9 F (36.6 C), temperature source Oral, resp. rate 16, height 6' (1.829 m), weight 69.4 kg, SpO2 100 %. Body mass index is 20.75 kg/m.   Treatment Plan Summary: Daily contact with patient to assess and evaluate symptoms and progress in treatment, Medication management, and Plan continue current medications.  Sarina Ill, DO 01/31/2021, 3:23 PM

## 2021-02-01 DIAGNOSIS — F2081 Schizophreniform disorder: Principal | ICD-10-CM

## 2021-02-01 MED ORDER — OLANZAPINE 5 MG PO TABS
15.0000 mg | ORAL_TABLET | Freq: Every day | ORAL | Status: DC
  Administered 2021-02-01 – 2021-02-02 (×2): 15 mg via ORAL
  Filled 2021-02-01 (×2): qty 1

## 2021-02-01 NOTE — Progress Notes (Signed)
Recreation Therapy Notes  INPATIENT RECREATION TR PLAN  Patient Details Name: [name] MRN: [id number] DOB: 29-Apr-1990 Today's Date: 02/01/2021  Rec Therapy Plan Is patient appropriate for Therapeutic Recreation?: Yes Treatment times per week: at least 3 Estimated Length of Stay: 5-7 days TR Treatment/Interventions: Group participation (Comment)  Discharge Criteria Pt will be discharged from therapy if:: Discharged Treatment plan/goals/alternatives discussed and agreed upon by:: Patient/family  Discharge Summary     Bradley Casey 02/01/2021, 12:29 PM

## 2021-02-01 NOTE — Progress Notes (Signed)
Recreation Therapy Notes   Date: 02/01/2021  Time: 10:30 am      Location: Craft room    Behavioral response: N/A   Intervention Topic: Time Management    Discussion/Intervention: Patient did not attend group.   Clinical Observations/Feedback:  Patient did not attend group.    Timofey Carandang LRT/CTRS        Sona Nations 02/01/2021 12:03 PM

## 2021-02-01 NOTE — Group Note (Signed)
[facility] LCSW Group Therapy Note    Group Date: 02/01/2021 Start Time: 1300 End Time: 1400  Type of Therapy and Topic:  Group Therapy:  Overcoming Obstacles  Participation Level:  [facility] PARTICIPATION LEVEL: Did Not Attend  Mood:  Description of Group:   In this group patients will be encouraged to explore what they see as obstacles to their own wellness and recovery. They will be guided to discuss their thoughts, feelings, and behaviors related to these obstacles. The group will process together ways to cope with barriers, with attention given to specific choices patients can make. Each patient will be challenged to identify changes they are motivated to make in order to overcome their obstacles. This group will be process-oriented, with patients participating in exploration of their own experiences as well as giving and receiving support and challenge from other group members.  Therapeutic Goals: 1. Patient will identify personal and current obstacles as they relate to admission. 2. Patient will identify barriers that currently interfere with their wellness or overcoming obstacles.  3. Patient will identify feelings, thought process and behaviors related to these barriers. 4. Patient will identify two changes they are willing to make to overcome these obstacles:    Summary of Patient Progress   X   Therapeutic Modalities:   Cognitive Behavioral Therapy Solution Focused Therapy Motivational Interviewing Relapse Prevention Therapy   Bradley Casey Bradley Natori Gudino, LCSW 

## 2021-02-01 NOTE — BH IP Treatment Plan (Signed)
Interdisciplinary Treatment and Diagnostic Plan Update  02/01/2021 Time of Session: 9:30AM Osceola Depaz MRN: [id number]  Principal Diagnosis: Cocaine abuse with cocaine-induced psychotic disorder [facility])  Secondary Diagnoses: Principal Problem:   Cocaine abuse with cocaine-induced psychotic disorder ([place])   Current Medications:  Current Facility-Administered Medications  Medication Dose Route Frequency Provider Last Rate Last Admin   acetaminophen (TYLENOL) tablet 650 mg  650 mg Oral Q6H PRN Patrecia Pour, NP       alum & mag hydroxide-simeth (MAALOX/MYLANTA) 200-200-20 MG/5ML suspension 30 mL  30 mL Oral Q4H PRN Patrecia Pour, NP       benztropine (COGENTIN) tablet 0.5 mg  0.5 mg Oral Daily Reita Cliche, Jamison Y, NP   0.5 mg at 02/01/21 0809   feeding supplement (ENSURE ENLIVE / ENSURE PLUS) liquid 237 mL  237 mL Oral TID BM Patrecia Pour, NP   237 mL at 01/31/21 2035   FLUoxetine (PROZAC) capsule 20 mg  20 mg Oral Daily Parks Ranger, DO   20 mg at 02/01/21 0809   gabapentin (NEURONTIN) capsule 300 mg  300 mg Oral TID Patrecia Pour, NP   300 mg at 02/01/21 0809   haloperidol (HALDOL) tablet 2 mg  2 mg Oral BID Patrecia Pour, NP   2 mg at 02/01/21 0809   LORazepam (ATIVAN) tablet 1 mg  1 mg Oral BID Parks Ranger, DO   1 mg at 02/01/21 0809   magnesium hydroxide (MILK OF MAGNESIA) suspension 30 mL  30 mL Oral Daily PRN Patrecia Pour, NP       multivitamin with minerals tablet 1 tablet  1 tablet Oral Daily Patrecia Pour, NP   1 tablet at 02/01/21 0809   OLANZapine (ZYPREXA) tablet 10 mg  10 mg Oral QHS Parks Ranger, DO   10 mg at 01/31/21 2105   PTA Medications: Medications Prior to Admission  Medication Sig Dispense Refill Last Dose   feeding supplement (ENSURE ENLIVE / ENSURE PLUS) LIQD Take 237 mLs by mouth 3 (three) times daily between meals. 237 mL 12    Multiple Vitamin (MULTIVITAMIN WITH MINERALS) TABS tablet Take 1 tablet by mouth once  daily. (Patient not taking: Reported on 01/29/2021) 30 tablet 0    OLANZapine (ZYPREXA) 7.5 MG tablet Take 1 tablet (7.5 mg total) by mouth at bedtime. (Patient not taking: Reported on 01/29/2021) 30 tablet 1     Patient Stressors: Loss of support system   Substance abuse    Patient Strengths: Motivation for treatment/growth  Physical Health   Treatment Modalities: Medication Management, Group therapy, Case management,  1 to 1 session with clinician, Psychoeducation, Recreational therapy.   Physician Treatment Plan for Primary Diagnosis: Cocaine abuse with cocaine-induced psychotic disorder ([place]) Long Term Goal(s): Improvement in symptoms so as ready for discharge   Short Term Goals: Ability to identify changes in lifestyle to reduce recurrence of condition will improve Ability to verbalize feelings will improve Ability to disclose and discuss suicidal ideas Ability to demonstrate self-control will improve Ability to identify and develop effective coping behaviors will improve Ability to maintain clinical measurements within normal limits will improve Compliance with prescribed medications will improve Ability to identify triggers associated with substance abuse/mental health issues will improve  Medication Management: Evaluate patient's response, side effects, and tolerance of medication regimen.  Therapeutic Interventions: 1 to 1 sessions, Unit Group sessions and Medication administration.  Evaluation of Outcomes: Not Met  Physician Treatment Plan for Secondary Diagnosis: Principal Problem:  Cocaine abuse with cocaine-induced psychotic disorder ([place])  Long Term Goal(s): Improvement in symptoms so as ready for discharge   Short Term Goals: Ability to identify changes in lifestyle to reduce recurrence of condition will improve Ability to verbalize feelings will improve Ability to disclose and discuss suicidal ideas Ability to demonstrate self-control will improve Ability to  identify and develop effective coping behaviors will improve Ability to maintain clinical measurements within normal limits will improve Compliance with prescribed medications will improve Ability to identify triggers associated with substance abuse/mental health issues will improve     Medication Management: Evaluate patient's response, side effects, and tolerance of medication regimen.  Therapeutic Interventions: 1 to 1 sessions, Unit Group sessions and Medication administration.  Evaluation of Outcomes: Not Met   RN Treatment Plan for Primary Diagnosis: Cocaine abuse with cocaine-induced psychotic disorder ([place]) Long Term Goal(s): Knowledge of disease and therapeutic regimen to maintain health will improve  Short Term Goals: Ability to demonstrate self-control, Ability to participate in decision making will improve, Ability to verbalize feelings will improve, Ability to disclose and discuss suicidal ideas, Ability to identify and develop effective coping behaviors will improve, and Compliance with prescribed medications will improve  Medication Management: RN will administer medications as ordered by provider, will assess and evaluate patient's response and provide education to patient for prescribed medication. RN will report any adverse and/or side effects to prescribing provider.  Therapeutic Interventions: 1 on 1 counseling sessions, Psychoeducation, Medication administration, Evaluate responses to treatment, Monitor vital signs and CBGs as ordered, Perform/monitor CIWA, COWS, AIMS and Fall Risk screenings as ordered, Perform wound care treatments as ordered.  Evaluation of Outcomes: Not Met   LCSW Treatment Plan for Primary Diagnosis: Cocaine abuse with cocaine-induced psychotic disorder ([place]) Long Term Goal(s): Safe transition to appropriate next level of care at discharge, Engage patient in therapeutic group addressing interpersonal concerns.  Short Term Goals: Engage patient in  aftercare planning with referrals and resources, Increase social support, Increase ability to appropriately verbalize feelings, Increase emotional regulation, Facilitate acceptance of mental health diagnosis and concerns, Facilitate patient progression through stages of change regarding substance use diagnoses and concerns, Identify triggers associated with mental health/substance abuse issues, and Increase skills for wellness and recovery  Therapeutic Interventions: Assess for all discharge needs, 1 to 1 time with Social worker, Explore available resources and support systems, Assess for adequacy in community support network, Educate family and significant other(s) on suicide prevention, Complete Psychosocial Assessment, Interpersonal group therapy.  Evaluation of Outcomes: Not Met   Progress in Treatment: Attending groups: No. Participating in groups: No. Taking medication as prescribed: Yes. Toleration medication: Yes. Family/Significant other contact made: No, will contact:  once permission is granted Patient understands diagnosis: No. Discussing patient identified problems/goals with staff: Yes. Medical problems stabilized or resolved: Yes. Denies suicidal/homicidal ideation: Yes. Issues/concerns per patient self-inventory: No. Other: none   New problem(s) identified: No, Describe:  none  New Short Term/Long Term Goal(s): detox, elimination of symptoms of psychosis, medication management for mood stabilization; elimination of SI thoughts; development of comprehensive mental wellness/sobriety plan.   Patient Goals:  "to get better and mentally prepared, I have no choice"   Discharge Plan or Barriers: Patient reports that he wants assistance in finding housing once discharged.  CSW will assist patient with development of appropriate discharge plans.   Reason for Continuation of Hospitalization: Aggression Anxiety Depression Medication stabilization Suicidal ideation  Estimated  Length of Stay:  1-7 days   Scribe for Treatment Team: Herschel Sen[place]  Valorie Roosevelt, LCSW 02/01/2021 10:32 AM

## 2021-02-01 NOTE — Progress Notes (Signed)
[facility] MD Progress Note  02/01/2021 2:45 PM Bradley Casey  MRN:  [id number] Subjective: Follow-up for this patient who has had multiple visits to hospitals with psychotic symptoms.  Patient today met with treatment team and also met with me in 1 on 1.  He was actually quite articulate in describing to me some of the things he has been experiencing.  He told me that he felt like "the matrix was glitching" but also was able to tell me that that was just a metaphor and that he did not necessarily believe he was living in a simulation.  He described multiple odd things that have happened to him and how they disturb him.  They all sound like either minor coincidences or somewhat meaningless observations when he describes them but he is attributing a lot of paranoid sinking to them.  He recognizes that he is paranoid and talks about how he wants to get his mind back to how it was when he was about 30 years old.  Denies suicidal or homicidal thoughts.  Does seem oversedated.  Napping too much during the day. Principal Problem: Cocaine abuse with cocaine-induced psychotic disorder ([place]) Diagnosis: Principal Problem:   Cocaine abuse with cocaine-induced psychotic disorder ([place])  Total Time spent with patient: 30 minutes  Past Psychiatric History: Past history of multiple presentations to hospitals with psychotic symptoms and behaviors.  In the past often considered as most likely to be substance-induced  Past Medical History: History reviewed. No pertinent past medical history. History reviewed. No pertinent surgical history. Family History: History reviewed. No pertinent family history. Family Psychiatric  History: See previous Social History:  Social History   Substance and Sexual Activity  Alcohol Use Yes     Social History   Substance and Sexual Activity  Drug Use Yes   Types: Marijuana, Cocaine    Social History   Socioeconomic History   Marital status: Single    Spouse name: Not on file    Number of children: Not on file   Years of education: Not on file   Highest education level: Not on file  Occupational History   Not on file  Tobacco Use   Smoking status: Some Days   Smokeless tobacco: Never  Vaping Use   Vaping Use: Never used  Substance and Sexual Activity   Alcohol use: Yes   Drug use: Yes    Types: Marijuana, Cocaine   Sexual activity: Yes  Other Topics Concern   Not on file  Social History Narrative   Not on file   Social Determinants of Health   Financial Resource Strain: Not on file  Food Insecurity: Not on file  Transportation Needs: Not on file  Physical Activity: Not on file  Stress: Not on file  Social Connections: Not on file   Additional Social History:                         Sleep: Fair  Appetite:  Fair  Current Medications: Current Facility-Administered Medications  Medication Dose Route Frequency Provider Last Rate Last Admin   acetaminophen (TYLENOL) tablet 650 mg  650 mg Oral Q6H PRN Patrecia Pour, NP       alum & mag hydroxide-simeth (MAALOX/MYLANTA) 200-200-20 MG/5ML suspension 30 mL  30 mL Oral Q4H PRN Patrecia Pour, NP       benztropine (COGENTIN) tablet 0.5 mg  0.5 mg Oral Daily Patrecia Pour, NP   0.5 mg at 02/01/21 [phone]  feeding supplement (ENSURE ENLIVE / ENSURE PLUS) liquid 237 mL  237 mL Oral TID BM Patrecia Pour, NP   237 mL at 02/01/21 1438   FLUoxetine (PROZAC) capsule 20 mg  20 mg Oral Daily Parks Ranger, DO   20 mg at 02/01/21 0809   magnesium hydroxide (MILK OF MAGNESIA) suspension 30 mL  30 mL Oral Daily PRN Patrecia Pour, NP       multivitamin with minerals tablet 1 tablet  1 tablet Oral Daily Patrecia Pour, NP   1 tablet at 02/01/21 0809   OLANZapine (ZYPREXA) tablet 15 mg  15 mg Oral QHS Deeanna Beightol, Madie Reno, MD        Lab Results: No results found for this or any previous visit (from the past 48 hour(s)).  Blood Alcohol level:  Lab Results  Component Value Date   ETH <10  01/29/2021   ETH <10 16/11/9602    Metabolic Disorder Labs: No results found for: HGBA1C, MPG No results found for: PROLACTIN Lab Results  Component Value Date   CHOL 160 01/22/2019   TRIG 80 01/22/2019   HDL 61 01/22/2019   CHOLHDL 2.6 01/22/2019   VLDL 16 01/22/2019   LDLCALC 83 01/22/2019    Physical Findings: AIMS: Facial and Oral Movements Muscles of Facial Expression: None, normal Lips and Perioral Area: None, normal Jaw: None, normal Tongue: None, normal,Extremity Movements Upper (arms, wrists, hands, fingers): None, normal Lower (legs, knees, ankles, toes): None, normal, Trunk Movements Neck, shoulders, hips: None, normal, Overall Severity Severity of abnormal movements (highest score from questions above): None, normal Incapacitation due to abnormal movements: None, normal Patient's awareness of abnormal movements (rate only patient's report): No Awareness, Dental Status Current problems with teeth and/or dentures?: No Does patient usually wear dentures?: No  CIWA:    COWS:     Musculoskeletal: Strength & Muscle Tone: within normal limits Gait & Station: normal Patient leans: N/A  Psychiatric Specialty Exam:  Presentation  General Appearance: Appropriate for Environment  Eye Contact:Good  Speech:Clear and Coherent  Speech Volume:Normal  Handedness:Right   Mood and Affect  Mood:Anxious; Depressed  Affect:Blunt; Congruent   Thought Process  Thought Processes:Coherent  Descriptions of Associations:Intact  Orientation:Full (Time, Place and Person)  Thought Content:Logical  History of Schizophrenia/Schizoaffective disorder:No  Duration of Psychotic Symptoms:Less than six months  Hallucinations:No data recorded Ideas of Reference:Paranoia  Suicidal Thoughts:No data recorded Homicidal Thoughts:No data recorded  Sensorium  Memory:Immediate Fair; Recent Fair; Remote Fair  Judgment:Fair  Insight:Fair   Executive Functions   Concentration:Fair  Attention Span:Fair  [place]   Psychomotor Activity  Psychomotor Activity:No data recorded  Assets  Assets:Communication Skills; Desire for Improvement; Intimacy; Leisure Time; Resilience; Social Support   Sleep  Sleep:No data recorded   Physical Exam: Physical Exam Vitals and nursing note reviewed.  Constitutional:      Appearance: Normal appearance.  HENT:     Head: Normocephalic and atraumatic.     Mouth/Throat:     Pharynx: Oropharynx is clear.  Eyes:     Pupils: Pupils are equal, round, and reactive to light.  Cardiovascular:     Rate and Rhythm: Normal rate and regular rhythm.  Pulmonary:     Effort: Pulmonary effort is normal.     Breath sounds: Normal breath sounds.  Abdominal:     General: Abdomen is flat.     Palpations: Abdomen is soft.  Musculoskeletal:        General: Normal  range of motion.  Skin:    General: Skin is warm and dry.  Neurological:     General: No focal deficit present.     Mental Status: He is alert. Mental status is at baseline.  Psychiatric:        Attention and Perception: Attention normal.        Mood and Affect: Mood is anxious.        Speech: Speech is tangential.        Behavior: Behavior is agitated. Behavior is not aggressive.        Thought Content: Thought content is paranoid and delusional.        Cognition and Memory: Memory is impaired.   Review of Systems  Constitutional: Negative.   HENT: Negative.    Eyes: Negative.   Respiratory: Negative.    Cardiovascular: Negative.   Gastrointestinal: Negative.   Musculoskeletal: Negative.   Skin: Negative.   Neurological: Negative.   Psychiatric/Behavioral:  Positive for hallucinations and substance abuse. Negative for depression and suicidal ideas. The patient is nervous/anxious and has insomnia.   Blood pressure (!) 135/106, pulse 90, temperature 97.9 F (36.6 C), temperature source Oral, resp. rate  18, height 6' (1.829 m), weight 69.4 kg, SpO2 98 %. Body mass index is 20.75 kg/m.   Treatment Plan Summary: Medication management and Plan patient psychotic episodes have often been written off as largely substance induced although the way that he is describing now without any delirium man with pretty good insight and how consistently it has been going on for years I think supports more of an assumption that there is a chronic psychotic disorder of a type of schizophrenia going on.  I reviewed his medicines and it looked like there were several things we could stop and clean up.  Discontinued the Ativan.  Discontinued Haldol and increased olanzapine at night.  Discontinued gabapentin has not meant being necessary either.  Tried to work with him forming some report telling him I understood what he was describing and encouraging him to keep talking with [place]  Alethia Berthold, MD 02/01/2021, 2:45 PM

## 2021-02-01 NOTE — Progress Notes (Signed)
Patient got really angry and teary this am after a peer told him " that he killed her brother" Patient eventually was redirectable and took his morning medication and went to his room. He denied SI/HI/A/VH. Support and encouragement provided as needed.

## 2021-02-01 NOTE — Plan of Care (Signed)
  Problem: Activity: Goal: Will verbalize the importance of balancing activity with adequate rest periods Outcome: Progressing   Problem: Education: Goal: Will be free of psychotic symptoms Outcome: Progressing Goal: Knowledge of the prescribed therapeutic regimen will improve Outcome: Progressing   Problem: Coping: Goal: Coping ability will improve Outcome: Progressing Goal: Will verbalize feelings Outcome: Progressing   Problem: Health Behavior/Discharge Planning: Goal: Compliance with prescribed medication regimen will improve Outcome: Progressing   

## 2021-02-01 NOTE — Progress Notes (Signed)
Recreation Therapy Notes  INPATIENT RECREATION THERAPY ASSESSMENT  Patient Details Name: Leomar Westberg MRN: [id number] DOB: 1990/03/08 Today's Date: 02/01/2021       Information Obtained From: Patient (Patient agreed to complete assessment, when writer began to ask question patient started fake snoring out loud.)  Able to Participate in Assessment/Interview:    Patient Presentation:    Reason for Admission (Per Patient):    Patient Stressors:    Coping Skills:      Leisure Interests (2+):     Frequency of Recreation/Participation:    Awareness of Community Resources:     [employer]:     Current Use:    If no, Barriers?:    Expressed Interest in [employer] Information:    [place] of Residence:     Patient Main Form of Transportation:    Patient Strengths:     Patient Identified Areas of Improvement:     Patient Goal for Hospitalization:     Current SI (including self-harm):     Current HI:     Current AVH:    Staff Intervention Plan:    Consent to Intern Participation:    Dempsy Damiano 02/01/2021, 12:28 PM

## 2021-02-01 NOTE — Progress Notes (Signed)
Patient less isolative this evening and spent a great amount of time in the dayroom watching tv and interacting with other patients. He did however come as soon as he could to get his QHS meds and went to his room right afterward.  He is med compliant and received his medication without issue. He denies si/hi/avh at this encounter.  Will continue to monitor with Q 15 minute safety checks.    Cleo Butler-Nicholson, LPN

## 2021-02-02 ENCOUNTER — Other Ambulatory Visit: Payer: Self-pay

## 2021-02-02 DIAGNOSIS — F2081 Schizophreniform disorder: Secondary | ICD-10-CM | POA: Diagnosis not present

## 2021-02-02 MED ORDER — BENZTROPINE MESYLATE 0.5 MG PO TABS
0.5000 mg | ORAL_TABLET | Freq: Every day | ORAL | 0 refills | Status: DC
  Filled 2021-02-02: qty 10, 10d supply, fill #0

## 2021-02-02 MED ORDER — OLANZAPINE 5 MG PO TABS
15.0000 mg | ORAL_TABLET | Freq: Every day | ORAL | 0 refills | Status: DC
  Filled 2021-02-02: qty 30, 10d supply, fill #0

## 2021-02-02 MED ORDER — FLUOXETINE HCL 20 MG PO CAPS
20.0000 mg | ORAL_CAPSULE | Freq: Every day | ORAL | 0 refills | Status: DC
  Filled 2021-02-02: qty 10, 10d supply, fill #0

## 2021-02-02 NOTE — Group Note (Signed)
[facility] LCSW Group Therapy Note ° ° °Group Date: 02/02/2021 °Start Time: 1300 °End Time: 1400 ° °Type of Therapy/Topic:  Group Therapy:  Feelings about Diagnosis ° °Participation Level:  Did Not Attend  ° °Mood: n/a  ° ° °Description of Group:   ° This group will allow patients to explore their thoughts and feelings about diagnoses they have received. Patients will be guided to explore their level of understanding and acceptance of these diagnoses. Facilitator will encourage patients to process their thoughts and feelings about the reactions of others to their diagnosis, and will guide patients in identifying ways to discuss their diagnosis with significant others in their lives. This group will be process-oriented, with patients participating in exploration of their own experiences as well as giving and receiving support and challenge from other group members. ° ° °Therapeutic Goals: °1. Patient will demonstrate understanding of diagnosis as evidence by identifying two or more symptoms of the disorder:  °2. Patient will be able to express two feelings regarding the diagnosis °3. Patient will demonstrate ability to communicate their needs through discussion and/or role plays ° °Summary of Patient Progress: °Patient did not attend group despite encouraged participation.   ° ° ° °Therapeutic Modalities:   °Cognitive Behavioral Therapy °Brief Therapy °Feelings Identification  ° ° °Nikala Walsworth W Lanita Stammen, LCSWA °

## 2021-02-02 NOTE — Progress Notes (Signed)
[facility] MD Progress Note  02/02/2021 5:12 PM Bradley Casey  MRN:  [id number] Subjective: Follow-up for this 30 year old man with recurrent psychotic symptoms.  Patient came to speak with me today and was quite pleasant.  He has been compliant with medicine.  He actually seemed more relaxed after our conversation yesterday.  He told me he was not feeling as nervous.  Indicated a desire to really cooperate with treatment.  Denied suicidal or homicidal thought. Principal Problem: Cocaine abuse with cocaine-induced psychotic disorder ([facility]) Diagnosis: Principal Problem:   Cocaine abuse with cocaine-induced psychotic disorder ([facility])  Total Time spent with patient: 30 minutes  Past Psychiatric History: Past history of frequent presentations with psychotic symptoms but often drops under the radar outside the hospital because of his substance use  Past Medical History: History reviewed. No pertinent past medical history. History reviewed. No pertinent surgical history. Family History: History reviewed. No pertinent family history. Family Psychiatric  History: None reported Social History:  Social History   Substance and Sexual Activity  Alcohol Use Yes     Social History   Substance and Sexual Activity  Drug Use Yes   Types: Marijuana, Cocaine    Social History   Socioeconomic History   Marital status: Single    Spouse name: Not on file   Number of children: Not on file   Years of education: Not on file   Highest education level: Not on file  Occupational History   Not on file  Tobacco Use   Smoking status: Some Days   Smokeless tobacco: Never  Vaping Use   Vaping Use: Never used  Substance and Sexual Activity   Alcohol use: Yes   Drug use: Yes    Types: Marijuana, Cocaine   Sexual activity: Yes  Other Topics Concern   Not on file  Social History Narrative   Not on file   Social Determinants of Health   Financial Resource Strain: Not on file  Food Insecurity: Not on file   Transportation Needs: Not on file  Physical Activity: Not on file  Stress: Not on file  Social Connections: Not on file   Additional Social History:                         Sleep: Fair  Appetite:  Fair  Current Medications: Current Facility-Administered Medications  Medication Dose Route Frequency Provider Last Rate Last Admin   acetaminophen (TYLENOL) tablet 650 mg  650 mg Oral Q6H PRN Charm Rings, NP       alum & mag hydroxide-simeth (MAALOX/MYLANTA) 200-200-20 MG/5ML suspension 30 mL  30 mL Oral Q4H PRN Charm Rings, NP       benztropine (COGENTIN) tablet 0.5 mg  0.5 mg Oral Daily Shaune Pollack, Jamison Y, NP   0.5 mg at 02/02/21 0801   feeding supplement (ENSURE ENLIVE / ENSURE PLUS) liquid 237 mL  237 mL Oral TID BM Charm Rings, NP   237 mL at 02/01/21 2035   FLUoxetine (PROZAC) capsule 20 mg  20 mg Oral Daily Sarina Ill, DO   20 mg at 02/02/21 0801   magnesium hydroxide (MILK OF MAGNESIA) suspension 30 mL  30 mL Oral Daily PRN Charm Rings, NP       multivitamin with minerals tablet 1 tablet  1 tablet Oral Daily Charm Rings, NP   1 tablet at 02/02/21 0801   OLANZapine (ZYPREXA) tablet 15 mg  15 mg Oral QHS Courtenay Hirth  T, MD   15 mg at 02/01/21 2133    Lab Results: No results found for this or any previous visit (from the past 48 hour(s)).  Blood Alcohol level:  Lab Results  Component Value Date   ETH <10 01/29/2021   ETH <10 12/06/2020    Metabolic Disorder Labs: No results found for: HGBA1C, MPG No results found for: PROLACTIN Lab Results  Component Value Date   CHOL 160 01/22/2019   TRIG 80 01/22/2019   HDL 61 01/22/2019   CHOLHDL 2.6 01/22/2019   VLDL 16 01/22/2019   LDLCALC 83 01/22/2019    Physical Findings: AIMS: Facial and Oral Movements Muscles of Facial Expression: None, normal Lips and Perioral Area: None, normal Jaw: None, normal Tongue: None, normal,Extremity Movements Upper (arms, wrists, hands, fingers):  None, normal Lower (legs, knees, ankles, toes): None, normal, Trunk Movements Neck, shoulders, hips: None, normal, Overall Severity Severity of abnormal movements (highest score from questions above): None, normal Incapacitation due to abnormal movements: None, normal Patient's awareness of abnormal movements (rate only patient's report): No Awareness, Dental Status Current problems with teeth and/or dentures?: No Does patient usually wear dentures?: No  CIWA:    COWS:     Musculoskeletal: Strength & Muscle Tone: within normal limits Gait & Station: normal Patient leans: N/A  Psychiatric Specialty Exam:  Presentation  General Appearance: Appropriate for Environment  Eye Contact:Good  Speech:Clear and Coherent  Speech Volume:Normal  Handedness:Right   Mood and Affect  Mood:Anxious; Depressed  Affect:Blunt; Congruent   Thought Process  Thought Processes:Coherent  Descriptions of Associations:Intact  Orientation:Full (Time, Place and Person)  Thought Content:Logical  History of Schizophrenia/Schizoaffective disorder:No  Duration of Psychotic Symptoms:Less than six months  Hallucinations:No data recorded Ideas of Reference:Paranoia  Suicidal Thoughts:No data recorded Homicidal Thoughts:No data recorded  Sensorium  Memory:Immediate Fair; Recent Fair; Remote Fair  Judgment:Fair  Insight:Fair   Executive Functions  Concentration:Fair  Attention Span:Fair  Recall:Fair  Fund of Knowledge:Fair  Language:Fair   Psychomotor Activity  Psychomotor Activity:No data recorded  Assets  Assets:Communication Skills; Desire for Improvement; Intimacy; Leisure Time; Resilience; Social Support   Sleep  Sleep:No data recorded   Physical Exam: Physical Exam Vitals and nursing note reviewed.  Constitutional:      Appearance: Normal appearance.  HENT:     Head: Normocephalic and atraumatic.     Mouth/Throat:     Pharynx: Oropharynx is clear.  Eyes:      Pupils: Pupils are equal, round, and reactive to light.  Cardiovascular:     Rate and Rhythm: Normal rate and regular rhythm.  Pulmonary:     Effort: Pulmonary effort is normal.     Breath sounds: Normal breath sounds.  Abdominal:     General: Abdomen is flat.     Palpations: Abdomen is soft.  Musculoskeletal:        General: Normal range of motion.  Skin:    General: Skin is warm and dry.  Neurological:     General: No focal deficit present.     Mental Status: He is alert. Mental status is at baseline.  Psychiatric:        Attention and Perception: Attention normal.        Mood and Affect: Mood is anxious.        Speech: Speech is tangential.        Behavior: Behavior is cooperative.        Thought Content: Thought content is paranoid.   Review of Systems  Constitutional: Negative.  HENT: Negative.    Eyes: Negative.   Respiratory: Negative.    Cardiovascular: Negative.   Gastrointestinal: Negative.   Musculoskeletal: Negative.   Skin: Negative.   Neurological: Negative.   Psychiatric/Behavioral:  Positive for hallucinations. Negative for depression, substance abuse and suicidal ideas. The patient is nervous/anxious.   Blood pressure (!) 147/102, pulse 94, temperature 97.9 F (36.6 C), temperature source Oral, resp. rate 18, height 6' (1.829 m), weight 69.4 kg, SpO2 98 %. Body mass index is 20.75 kg/m.   Treatment Plan Summary: Medication management and Plan tolerating antipsychotic.  Seems to be showing a little improvement.  Going to groups and interacting more.  Early in the day the patient agreed with my suggestion that he stay in the hospital until Thursday.  Later in the afternoon he came by asking if we could consider discharge tomorrow.  I told him that that was a possibility and we will consider it.  I will go ahead and put in orders to suggest a supply of medicine if that is what we are going to do.  Either way he will need to follow up with [facility] at  discharge.  Bradley Rasmussen, MD 02/02/2021, 5:12 PM

## 2021-02-02 NOTE — Progress Notes (Signed)
Pleasant and easy to engage in conversation. He received his meds without incident. Discussed and was receptive to the increase in Zyprexa from 10mg  to 15mg . Hoping it well help him sleep solidly through the night. Very open and good incite about his drug use and the consequences associated with his choices. Encouraged him to seek staff with any concerns.  Will continue to monitor with q 15 minute safety rounds.  C Butler-Nicholson, LPN

## 2021-02-02 NOTE — Progress Notes (Signed)
Pt denies depression, anxiety, SI, HI and AVH. Pt was educated on care plan and verbalizes understanding. Annaston Upham RN 

## 2021-02-02 NOTE — Progress Notes (Signed)
Recreation Therapy Notes   Date: 02/02/2021  Time: 10:15 am   Location:  Craft room    Behavioral response: N/A   Intervention Topic: Self-care      Discussion/Intervention: Patient did not attend group.  Clinical Observations/Feedback:  Patient did not attend group.   Idolina Mantell LRT/CTRS        Yannick Steuber 02/02/2021 12:06 PM

## 2021-02-03 ENCOUNTER — Other Ambulatory Visit: Payer: Self-pay

## 2021-02-03 DIAGNOSIS — F2081 Schizophreniform disorder: Secondary | ICD-10-CM | POA: Diagnosis not present

## 2021-02-03 MED ORDER — FLUOXETINE HCL 20 MG PO CAPS: 20.0000 mg | ORAL_CAPSULE | Freq: Every day | ORAL | 1 refills | Status: DC

## 2021-02-03 MED ORDER — BENZTROPINE MESYLATE 0.5 MG PO TABS: 0.5000 mg | ORAL_TABLET | Freq: Every day | ORAL | 1 refills | Status: DC

## 2021-02-03 MED ORDER — OLANZAPINE 15 MG PO TABS: 15.0000 mg | ORAL_TABLET | Freq: Every day | ORAL | 1 refills | Status: DC

## 2021-02-03 NOTE — Progress Notes (Signed)
Recreation Therapy Notes   Date: 02/03/2021  Time: 10:15am   Location:  Craft room    Behavioral response: N/A   Intervention Topic: Values   Discussion/Intervention: Patient did not attend group.  Clinical Observations/Feedback:  Patient did not attend group.   Nikodem Leadbetter LRT/CTRS          Moneisha Vosler 02/03/2021 11:18 AM

## 2021-02-03 NOTE — Progress Notes (Signed)
Recreation Therapy Notes  INPATIENT RECREATION TR PLAN  Patient Details Name: [name] MRN: [id number] DOB: 03-24-1990 Today's Date: 02/03/2021  Rec Therapy Plan Is patient appropriate for Therapeutic Recreation?: Yes Treatment times per week: at least 3 Estimated Length of Stay: 5-7 days TR Treatment/Interventions: Group participation (Comment)  Discharge Criteria Pt will be discharged from therapy if:: Discharged Treatment plan/goals/alternatives discussed and agreed upon by:: Patient/family  Discharge Summary Short term goals set: Patient will engage in groups without prompting or encouragement from LRT x3 group sessions within 5 recreation therapy group sessions Short term goals met: Not met Reason goals not met: Patient did not attend any groups Therapeutic equipment acquired: N/A Reason patient discharged from therapy: Discharge from hospital Pt/family agrees with progress & goals achieved: Yes Date patient discharged from therapy: 02/03/21   Norlan Rann 02/03/2021, 11:30 AM

## 2021-02-03 NOTE — Progress Notes (Signed)
Cooperative with treatment, medication compliant, no new behavioral issues to report on shift at this time. He denies SI,HI& AVH.

## 2021-02-03 NOTE — Progress Notes (Signed)
°  [facility] Adult Case Management Discharge Plan :  Will you be returning to the same living situation after discharge:  Yes,  Patient to return to cousin's residence.  At discharge, do you have transportation home?: Yes,  CSW to assist with transportation, Laban Emperor has been signed by patient and emailed to [employer].  Do you have the ability to pay for your medications: No.  Release of information consent forms completed and in the chart;  Patient's signature needed at discharge.  Patient to Follow up at:  Follow-up Information     [facility] Go to.   Why: Lorella Nimrod, peer support, will pick you up on Monday 19 December at 0700 am for your intake appointment. Contact information: [address] [place] [address] [phone]                 Next level of care provider has access to [facility] Link:no  Safety Planning and Suicide Prevention discussed: Yes,  SPE completed with patient. Patient refused for patient to reach collateral contact.     Has patient been referred to the Quitline?: Patient refused referral  Patient has been referred for addiction treatment: Pt. refused referral  Corky Crafts, LCSWA 02/03/2021, 12:02 PM

## 2021-02-03 NOTE — Progress Notes (Signed)
Pt was educated on dc plan and verbalizes understanding. Pt denies SI, HI and AVH. Pt received belongings. Torrie Mayers RN

## 2021-02-03 NOTE — Discharge Summary (Signed)
Physician Discharge Summary Note  Patient:  Bradley Casey is an 30 y.o., male MRN:  [id number] DOB:  12-Sep-1990 Patient phone:  [phone] (home)  Patient address:   [address] [place] [address],  Total Time spent with patient: 45 minutes  Date of Admission:  01/29/2021 Date of Discharge: 02/03/2021  Reason for Admission: Patient was admitted after presenting with paranoia and psychotic symptoms and poor self-care and substance abuse  Principal Problem: Schizophreniform disorder [facility]) Discharge Diagnoses: Principal Problem:   Schizophreniform disorder (HCC) Active Problems:   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  Past Medical History: History reviewed. No pertinent past medical history. History reviewed. No pertinent surgical history. Family History: History reviewed. No pertinent family history. Family Psychiatric  History: No family history Social History:  Social History   Substance and Sexual Activity  Alcohol Use Yes     Social History   Substance and Sexual Activity  Drug Use Yes   Types: Marijuana, Cocaine    Social History   Socioeconomic History   Marital status: Single    Spouse name: Not on file   Number of children: Not on file   Years of education: Not on file   Highest education level: Not on file  Occupational History   Not on file  Tobacco Use   Smoking status: Some Days   Smokeless tobacco: Never  Vaping Use   Vaping Use: Never used  Substance and Sexual Activity   Alcohol use: Yes   Drug use: Yes    Types: Marijuana, Cocaine   Sexual activity: Yes  Other Topics Concern   Not on file  Social History Narrative   Not on file   Social Determinants of Health   Financial  Resource Strain: Not on file  Food Insecurity: Not on file  Transportation Needs: Not on file  Physical Activity: Not on file  Stress: Not on file  Social Connections: Not on file    Hospital Course: Admitted to the psychiatric unit.  15-minute checks maintained.  Patient was not aggressive violent or threatening and did not display any signs of self-injury.  He was compliant with treatment.  He was forthcoming and pretty insightful in his conversation.  Tolerated medicine well.  At the time of discharge he is expressing improved insight and agrees to follow up with outpatient treatment meeting with a representative from [facility].  Current medication Prozac and olanzapine.  7-day supply plus prescriptions provided at discharge.  Patient at this time no longer meets commitment criteria and appears to be safe and appropriate for discharge  Physical Findings: AIMS: Facial and Oral Movements Muscles of Facial Expression: None, normal Lips and Perioral Area: None, normal Jaw: None, normal Tongue: None, normal,Extremity Movements Upper (arms, wrists, hands, fingers): None, normal Lower (legs, knees, ankles, toes): None, normal, Trunk Movements Neck, shoulders, hips: None, normal, Overall Severity Severity of abnormal movements (highest score from questions above): None, normal Incapacitation due to abnormal movements: None, normal Patient's awareness of abnormal movements (rate only patient's report): No Awareness, Dental Status Current problems with teeth and/or dentures?: No Does patient usually wear dentures?: No  CIWA:    COWS:     Musculoskeletal: Strength & Muscle Tone: within normal limits Gait & Station: normal Patient leans: N/A   Psychiatric Specialty Exam:  Presentation  General Appearance: Appropriate for Environment  Eye Contact:Good  Speech:Clear and Coherent  Speech Volume:Normal  Handedness:Right   Mood and Affect  Mood:Anxious; Depressed  Affect:Blunt;  Congruent   Thought Process  Thought Processes:Coherent  Descriptions of Associations:Intact  Orientation:Full (Time, Place and Person)  Thought Content:Logical  History of Schizophrenia/Schizoaffective disorder:No  Duration of Psychotic Symptoms:Less than six months  Hallucinations:No data recorded Ideas of Reference:Paranoia  Suicidal Thoughts:No data recorded Homicidal Thoughts:No data recorded  Sensorium  Memory:Immediate Fair; Recent Fair; Remote Fair  Judgment:Fair  Insight:Fair   Executive Functions  Concentration:Fair  Attention Span:Fair  Recall:Fair  Fund of Knowledge:Fair  Language:Fair   Psychomotor Activity  Psychomotor Activity:No data recorded  Assets  Assets:Communication Skills; Desire for Improvement; Intimacy; Leisure Time; Resilience; Social Support   Sleep  Sleep:No data recorded   Physical Exam: Physical Exam Vitals and nursing note reviewed.  Constitutional:      Appearance: Normal appearance.  HENT:     Head: Normocephalic and atraumatic.     Mouth/Throat:     Pharynx: Oropharynx is clear.  Eyes:     Pupils: Pupils are equal, round, and reactive to light.  Cardiovascular:     Rate and Rhythm: Normal rate and regular rhythm.  Pulmonary:     Effort: Pulmonary effort is normal.     Breath sounds: Normal breath sounds.  Abdominal:     General: Abdomen is flat.     Palpations: Abdomen is soft.  Musculoskeletal:        General: Normal range of motion.  Skin:    General: Skin is warm and dry.  Neurological:     General: No focal deficit present.     Mental Status: He is alert. Mental status is at baseline.  Psychiatric:        Mood and Affect: Mood normal.        Thought Content: Thought content normal.   Review of Systems  Constitutional: Negative.   HENT: Negative.    Eyes: Negative.   Respiratory: Negative.    Cardiovascular: Negative.   Gastrointestinal: Negative.   Musculoskeletal: Negative.   Skin:  Negative.   Neurological: Negative.   Psychiatric/Behavioral: Negative.    Blood pressure (!) 141/100, pulse 92, temperature 98 F (36.7 C), temperature source Oral, resp. rate 18, height 6' (1.829 m), weight 69.4 kg, SpO2 97 %. Body mass index is 20.75 kg/m.   Social History   Tobacco Use  Smoking Status Some Days  Smokeless Tobacco Never   Tobacco Cessation:  N/A, patient does not currently use tobacco products   Blood Alcohol level:  Lab Results  Component Value Date   ETH <10 01/29/2021   ETH <10 12/06/2020    Metabolic Disorder Labs:  No results found for: HGBA1C, MPG No results found for: PROLACTIN Lab Results  Component Value Date   CHOL 160 01/22/2019   TRIG 80 01/22/2019   HDL 61 01/22/2019   CHOLHDL 2.6 01/22/2019   VLDL 16 01/22/2019   LDLCALC 83 01/22/2019    See Psychiatric Specialty Exam and Suicide Risk Assessment completed by Attending Physician prior to discharge.  Discharge destination:  Home  Is patient on multiple antipsychotic therapies at discharge:  No   Has Patient had three or more failed trials of antipsychotic monotherapy by history:  No  Recommended Plan for Multiple Antipsychotic Therapies: NA  Discharge Instructions     Diet - low sodium heart healthy   Complete by: As directed    Increase activity slowly  Complete by: As directed       Allergies as of 02/03/2021   No Known Allergies      Medication List     STOP taking these medications    feeding supplement Liqd   multivitamin with minerals Tabs tablet       TAKE these medications      Indication  benztropine 0.5 MG tablet Commonly known as: COGENTIN Take 1 tablet (0.5 mg total) by mouth daily.  Indication: Extrapyramidal Reaction caused by Medications   FLUoxetine 20 MG capsule Commonly known as: PROZAC Take 1 capsule (20 mg total) by mouth daily.  Indication: Depression   OLANZapine 15 MG tablet Commonly known as: ZYPREXA Take 1 tablet (15 mg  total) by mouth at bedtime. What changed:  medication strength how much to take  Indication: Schizophrenia        Follow-up Information     [facility] Go to.   Why: Lorella Nimrod, peer support, will pick you up on Monday 19 December at 0700 am for your intake appointment. Contact information: [address] [place] [address] [phone]                 Follow-up recommendations: Agrees to follow up with [facility] and peers support.  Comments: Prescriptions and medications provided  Signed: Mordecai Rasmussen, MD 02/03/2021, 10:55 AM

## 2021-02-03 NOTE — Plan of Care (Signed)
Problem: Group Participation Goal: STG - Patient will engage in groups without prompting or encouragement from LRT x3 group sessions within 5 recreation therapy group sessions Description: STG - Patient will engage in groups without prompting or encouragement from LRT x3 group sessions within 5 recreation therapy group sessions 02/03/2021 1129 by Ernest Haber, LRT Outcome: Not Applicable [id number] 2003 by Ernest Haber, LRT Outcome: Not Met (add Reason) Note: Patient did not attend any groups.

## 2021-02-03 NOTE — Plan of Care (Signed)
Pt denies depression, anxiety, SI, HI and AVH. Pt was educated on care plan and verbalizes understanding. Torrie Mayers Rn Problem: Activity: Goal: Will verbalize the importance of balancing activity with adequate rest periods Outcome: Progressing   Problem: Education: Goal: Will be free of psychotic symptoms Outcome: Progressing Goal: Knowledge of the prescribed therapeutic regimen will improve Outcome: Progressing   Problem: Coping: Goal: Coping ability will improve Outcome: Progressing Goal: Will verbalize feelings Outcome: Progressing   Problem: Health Behavior/Discharge Planning: Goal: Compliance with prescribed medication regimen will improve Outcome: Progressing   Problem: Nutritional: Goal: Ability to achieve adequate nutritional intake will improve Outcome: Progressing   Problem: Role Relationship: Goal: Ability to communicate needs accurately will improve Outcome: Progressing Goal: Ability to interact with others will improve Outcome: Progressing   Problem: Safety: Goal: Ability to redirect hostility and anger into socially appropriate behaviors will improve Outcome: Progressing Goal: Ability to remain free from injury will improve Outcome: Progressing   Problem: Self-Care: Goal: Ability to participate in self-care as condition permits will improve Outcome: Progressing   Problem: Self-Concept: Goal: Will verbalize positive feelings about self Outcome: Progressing   Problem: Education: Goal: Knowledge of [facility] Education information/materials will improve Outcome: Progressing Goal: Emotional status will improve Outcome: Progressing Goal: Mental status will improve Outcome: Progressing Goal: Verbalization of understanding the information provided will improve Outcome: Progressing   Problem: Activity: Goal: Interest or engagement in activities will improve Outcome: Progressing Goal: Sleeping patterns will improve Outcome: Progressing    Problem: Coping: Goal: Ability to verbalize frustrations and anger appropriately will improve Outcome: Progressing Goal: Ability to demonstrate self-control will improve Outcome: Progressing   Problem: Health Behavior/Discharge Planning: Goal: Identification of resources available to assist in meeting health care needs will improve Outcome: Progressing Goal: Compliance with treatment plan for underlying cause of condition will improve Outcome: Progressing   Problem: Physical Regulation: Goal: Ability to maintain clinical measurements within normal limits will improve Outcome: Progressing   Problem: Safety: Goal: Periods of time without injury will increase Outcome: Progressing   Problem: Education: Goal: Ability to state activities that reduce stress will improve Outcome: Progressing   Problem: Coping: Goal: Ability to identify and develop effective coping behavior will improve Outcome: Progressing   Problem: Self-Concept: Goal: Ability to identify factors that promote anxiety will improve Outcome: Progressing Goal: Level of anxiety will decrease Outcome: Progressing Goal: Ability to modify response to factors that promote anxiety will improve Outcome: Progressing

## 2021-02-03 NOTE — [facility] Suicide Risk Assessment (Signed)
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)   Total Time spent with patient: 45 minutes  Musculoskeletal: Strength & Muscle Tone: within normal limits Gait & Station: normal Patient leans: N/A  Psychiatric Specialty Exam  Presentation  General Appearance: Appropriate for Environment  Eye Contact:Good  Speech:Clear and Coherent  Speech Volume:Normal  Handedness:Right   Mood and Affect  Mood:Anxious; Depressed  Duration of Depression Symptoms: Greater than two weeks  Affect:Blunt; Congruent   Thought Process  Thought Processes:Coherent  Descriptions of Associations:Intact  Orientation:Full (Time, Place and Person)  Thought Content:Logical  History of Schizophrenia/Schizoaffective disorder:No  Duration of Psychotic Symptoms:Less than six months  Hallucinations:No data recorded Ideas of Reference:Paranoia  Suicidal Thoughts:No data recorded Homicidal Thoughts:No data recorded  Sensorium  Memory:Immediate Fair; Recent Fair; Remote Fair  Judgment:Fair  Insight:Fair   Executive Functions  Concentration:Fair  Attention Span:Fair  Recall:Fair  Fund of Knowledge:Fair  Language:Fair   Psychomotor Activity  Psychomotor Activity:No data recorded  Assets  Assets:Communication Skills; Desire for Improvement; Intimacy; Leisure Time; Resilience; Social Support   Sleep  Sleep:No data recorded  Physical Exam: Physical Exam Vitals and nursing note reviewed.  Constitutional:      Appearance: Normal appearance.  HENT:     Head: Normocephalic and atraumatic.     Mouth/Throat:     Pharynx: Oropharynx is clear.  Eyes:     Pupils: Pupils are equal, round, and reactive to light.  Cardiovascular:     Rate and Rhythm: Normal rate and regular rhythm.  Pulmonary:     Effort:  Pulmonary effort is normal.     Breath sounds: Normal breath sounds.  Abdominal:     General: Abdomen is flat.     Palpations: Abdomen is soft.  Musculoskeletal:        General: Normal range of motion.  Skin:    General: Skin is warm and dry.  Neurological:     General: No focal deficit present.     Mental Status: He is alert. Mental status is at baseline.  Psychiatric:        Attention and Perception: Attention normal.        Mood and Affect: Mood normal.        Speech: Speech normal.        Behavior: Behavior normal.        Thought Content: Thought content normal.        Cognition and Memory: Cognition normal.        Judgment: Judgment normal.   Review of Systems  Constitutional: Negative.   HENT: Negative.    Eyes: Negative.   Respiratory: Negative.    Cardiovascular: Negative.   Gastrointestinal: Negative.   Musculoskeletal: Negative.   Skin: Negative.   Neurological: Negative.   Psychiatric/Behavioral: Negative.    Blood pressure (!) 141/100, pulse 92, temperature 98 F (36.7 C), temperature source Oral, resp. rate 18, height 6' (1.829 m), weight 69.4 kg, SpO2 97 %. Body mass index is 20.75 kg/m.  Mental Status Per Nursing Assessment::   On Admission:  NA  Demographic Factors:  Male and Living alone  Loss Factors: Financial problems/change in socioeconomic status  Historical Factors: Impulsivity  Risk Reduction Factors:   Positive social support, Positive therapeutic relationship, and Positive coping skills or problem solving skills  Continued Clinical Symptoms:  Alcohol/Substance Abuse/Dependencies Schizophrenia:   Less than 80 years old  Cognitive Features  That Contribute To Risk:  None    Suicide Risk:  Minimal: No identifiable suicidal ideation.  Patients presenting with no risk factors but with morbid ruminations; may be classified as minimal risk based on the severity of the depressive symptoms   Follow-up Information     [facility] Go to.   Why: Lorella Nimrod, peer support, will pick you up on Monday 19 December at 0700 am for your intake appointment. Contact information: [address] [place] [address] [phone]                 Plan Of Care/Follow-up recommendations:  Patient is agreeable to following up with [facility] services.  Continue current medication.  Patient denies suicidal thoughts and displays upbeat optimistic attitude.  Does not appear to be at elevated risk of self-harm.  Mordecai Rasmussen, MD 02/03/2021, 10:50 AM

## 2021-02-17 ENCOUNTER — Other Ambulatory Visit: Payer: Self-pay

## 2021-02-17 ENCOUNTER — Emergency Department
Admission: EM | Admit: 2021-02-17 | Discharge: 2021-02-19 | Disposition: A | Discharge status: Home / Self Care | Payer: 59 | Attending: Emergency Medicine | Admitting: Emergency Medicine

## 2021-02-17 ENCOUNTER — Encounter: Payer: Self-pay | Admitting: Emergency Medicine

## 2021-02-17 DIAGNOSIS — F1910 Other psychoactive substance abuse, uncomplicated: Secondary | ICD-10-CM | POA: Diagnosis present

## 2021-02-17 DIAGNOSIS — Z20822 Contact with and (suspected) exposure to covid-19: Secondary | ICD-10-CM | POA: Insufficient documentation

## 2021-02-17 DIAGNOSIS — R441 Visual hallucinations: Secondary | ICD-10-CM

## 2021-02-17 DIAGNOSIS — Y900 Blood alcohol level of less than 20 mg/100 ml: Secondary | ICD-10-CM | POA: Insufficient documentation

## 2021-02-17 DIAGNOSIS — F17200 Nicotine dependence, unspecified, uncomplicated: Secondary | ICD-10-CM | POA: Insufficient documentation

## 2021-02-17 DIAGNOSIS — F1510 Other stimulant abuse, uncomplicated: Secondary | ICD-10-CM

## 2021-02-17 DIAGNOSIS — F14151 Cocaine abuse with cocaine-induced psychotic disorder with hallucinations: Secondary | ICD-10-CM | POA: Insufficient documentation

## 2021-02-17 DIAGNOSIS — F22 Delusional disorders: Secondary | ICD-10-CM

## 2021-02-17 DIAGNOSIS — F14951 Cocaine use, unspecified with cocaine-induced psychotic disorder with hallucinations: Secondary | ICD-10-CM | POA: Diagnosis present

## 2021-02-17 DIAGNOSIS — Z79899 Other long term (current) drug therapy: Secondary | ICD-10-CM | POA: Insufficient documentation

## 2021-02-17 LAB — COMPREHENSIVE METABOLIC PANEL
ALT: 18 U/L (ref 0–44)
AST: 22 U/L (ref 15–41)
Albumin: 4.2 g/dL (ref 3.5–5.0)
Alkaline Phosphatase: 74 U/L (ref 38–126)
Anion gap: 5 (ref 5–15)
BUN: 21 mg/dL — ABNORMAL HIGH (ref 6–20)
CO2: 25 mmol/L (ref 22–32)
Calcium: 9.1 mg/dL (ref 8.9–10.3)
Chloride: 102 mmol/L (ref 98–111)
Creatinine, Ser: 1.25 mg/dL — ABNORMAL HIGH (ref 0.61–1.24)
GFR, Estimated: >60 mL/min (ref >60)
Glucose, Bld: 93 mg/dL (ref 70–99)
Potassium: 4.2 mmol/L (ref 3.5–5.1)
Sodium: 132 mmol/L — ABNORMAL LOW (ref 135–145)
Total Bilirubin: 0.7 mg/dL (ref 0.3–1.2)
Total Protein: 7.6 g/dL (ref 6.5–8.1)

## 2021-02-17 LAB — ETHANOL: Alcohol, Ethyl (B): <10 mg/dL (ref <10)

## 2021-02-17 LAB — URINE DRUG SCREEN, QUALITATIVE (ARMC ONLY)
Amphetamines, Ur Screen: POSITIVE — AB
Barbiturates, Ur Screen: NOT DETECTED
Benzodiazepine, Ur Scrn: NOT DETECTED
Cannabinoid 50 Ng, Ur ~~LOC~~: POSITIVE — AB
Cocaine Metabolite,Ur ~~LOC~~: POSITIVE — AB
MDMA (Ecstasy)Ur Screen: NOT DETECTED
Methadone Scn, Ur: NOT DETECTED
Opiate, Ur Screen: NOT DETECTED
Phencyclidine (PCP) Ur S: NOT DETECTED
Tricyclic, Ur Screen: NOT DETECTED

## 2021-02-17 LAB — CBC
HCT: 46.1 % (ref 39.0–52.0)
Hemoglobin: 15.7 g/dL (ref 13.0–17.0)
MCH: 29.6 pg (ref 26.0–34.0)
MCHC: 34.1 g/dL (ref 30.0–36.0)
MCV: 87 fL (ref 80.0–100.0)
Platelets: 269 10*3/uL (ref 150–400)
RBC: 5.3 MIL/uL (ref 4.22–5.81)
RDW: 12.7 % (ref 11.5–15.5)
WBC: 10.2 10*3/uL (ref 4.0–10.5)
nRBC: 0 % (ref 0.0–0.2)

## 2021-02-17 LAB — CK: Total CK: 83 U/L (ref 49–397)

## 2021-02-17 LAB — RESP PANEL BY RT-PCR (FLU A&B, COVID) ARPGX2
Influenza A by PCR: NEGATIVE
Influenza B by PCR: NEGATIVE
SARS Coronavirus 2 by RT PCR: NEGATIVE

## 2021-02-17 LAB — SALICYLATE LEVEL: Salicylate Lvl: <7 mg/dL — ABNORMAL LOW (ref 7.0–30.0)

## 2021-02-17 LAB — ACETAMINOPHEN LEVEL: Acetaminophen (Tylenol), Serum: <10 ug/mL — ABNORMAL LOW (ref 10–30)

## 2021-02-17 MED ORDER — LACTATED RINGERS IV BOLUS
1000.0000 mL | Freq: Once | INTRAVENOUS | Status: AC
  Administered 2021-02-17: 06:00:00 1000 mL via INTRAVENOUS

## 2021-02-17 MED ORDER — LORAZEPAM 1 MG PO TABS
1.0000 mg | ORAL_TABLET | Freq: Once | ORAL | Status: AC
  Administered 2021-02-17: 11:00:00 1 mg via ORAL
  Filled 2021-02-17: qty 1

## 2021-02-17 MED ORDER — GABAPENTIN 300 MG PO CAPS
300.0000 mg | ORAL_CAPSULE | Freq: Three times a day (TID) | ORAL | Status: DC
  Administered 2021-02-17 – 2021-02-19 (×5): 300 mg via ORAL
  Filled 2021-02-17 (×5): qty 1

## 2021-02-17 MED ORDER — LORAZEPAM 2 MG/ML IJ SOLN: 1.0000 mg | INTRAMUSCULAR | Status: DC | PRN

## 2021-02-17 MED ORDER — LORAZEPAM 2 MG/ML IJ SOLN
1.0000 mg | Freq: Once | INTRAMUSCULAR | Status: AC
  Administered 2021-02-17: 06:00:00 1 mg via INTRAVENOUS
  Filled 2021-02-17: qty 1

## 2021-02-17 MED ORDER — LORAZEPAM 1 MG PO TABS: 1.0000 mg | ORAL_TABLET | ORAL | Status: DC | PRN

## 2021-02-17 MED ORDER — THIAMINE HCL 100 MG PO TABS
100.0000 mg | ORAL_TABLET | Freq: Every day | ORAL | Status: DC
  Administered 2021-02-17 – 2021-02-19 (×3): 100 mg via ORAL
  Filled 2021-02-17 (×3): qty 1

## 2021-02-17 MED ORDER — HALOPERIDOL 0.5 MG PO TABS
2.0000 mg | ORAL_TABLET | Freq: Two times a day (BID) | ORAL | Status: DC
  Administered 2021-02-17 – 2021-02-19 (×4): 2 mg via ORAL
  Filled 2021-02-17: qty 4
  Filled 2021-02-17: qty 1
  Filled 2021-02-17: qty 4
  Filled 2021-02-17: qty 1
  Filled 2021-02-17: qty 4
  Filled 2021-02-17: qty 1

## 2021-02-17 MED ORDER — FOLIC ACID 1 MG PO TABS
1.0000 mg | ORAL_TABLET | Freq: Every day | ORAL | Status: DC
  Administered 2021-02-17 – 2021-02-19 (×3): 1 mg via ORAL
  Filled 2021-02-17 (×3): qty 1

## 2021-02-17 MED ORDER — THIAMINE HCL 100 MG/ML IJ SOLN
100.0000 mg | Freq: Every day | INTRAMUSCULAR | Status: DC
  Filled 2021-02-17 (×2): qty 1

## 2021-02-17 MED ORDER — ACETAMINOPHEN 325 MG PO TABS
650.0000 mg | ORAL_TABLET | Freq: Once | ORAL | Status: AC | PRN
  Administered 2021-02-17: 05:00:00 650 mg via ORAL
  Filled 2021-02-17: qty 2

## 2021-02-17 MED ORDER — ADULT MULTIVITAMIN W/MINERALS CH
1.0000 | ORAL_TABLET | Freq: Every day | ORAL | Status: DC
  Administered 2021-02-17 – 2021-02-19 (×3): 1 via ORAL
  Filled 2021-02-17 (×3): qty 1

## 2021-02-17 MED ORDER — LORAZEPAM 1 MG PO TABS
1.0000 mg | ORAL_TABLET | Freq: Four times a day (QID) | ORAL | Status: DC | PRN
  Administered 2021-02-17: 18:00:00 1 mg via ORAL
  Filled 2021-02-17: qty 1

## 2021-02-17 NOTE — ED Notes (Signed)
Pt given breakfast tray

## 2021-02-17 NOTE — ED Provider Notes (Signed)
[facility] Emergency Department Provider Note  ____________________________________________   Event Date/Time   First MD Initiated Contact with Patient 02/17/21 [phone]     (approximate)  I have reviewed the triage vital signs and the nursing notes.   HISTORY  Chief Complaint Psychiatric Evaluation  Level 5 caveat:  history/ROS limited by acute intoxication and/or acute psychosis.  HPI Bradley Casey is a 30 y.o. male who presents voluntarily stating that he needs a mental health exam.  He said that he is tired of people being after him.  He has been hearing voices recently the tell him to harm other people.  He said he has had thoughts of hurting other people but no one specifically at the moment.  He is not having thoughts of killing himself.  He keeps hearing voices even as I am talking to him.  His eyes are darting around and seem to be looking behind me at times.  He admits to snorting cocaine earlier but denies any other drug use.  He denies any pain.  He is not having any shortness of breath.  Symptoms are acute in onset and severe.  He states he would just wishes it would stop.     History reviewed. No pertinent past medical history.  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 No pertinent surgical history.  Prior to Admission medications   Medication Sig Start Date End Date Taking? Authorizing Provider  benztropine (COGENTIN) 0.5 MG tablet Take 1 tablet (0.5 mg total) by mouth daily. 02/03/21   Clapacs, [name], MD  FLUoxetine (PROZAC) 20 MG capsule Take 1 capsule (20 mg total) by mouth daily. 02/03/21   Clapacs, [name], MD  OLANZapine (ZYPREXA) 15 MG tablet Take 1 tablet (15 mg total) by mouth at bedtime. 02/03/21   Clapacs, [name], MD    Allergies Patient has no known allergies.  History reviewed. No pertinent family history.  Social History Social History   Tobacco Use   Smoking status: Some Days   Smokeless tobacco: Never  Vaping Use   Vaping Use: Never used  Substance Use Topics   Alcohol use: Yes   Drug use: Yes    Types: Marijuana, Cocaine    Review of Systems Level 5 caveat:  history/ROS limited by acute intoxication and/or acute psychosis.   ____________________________________________   PHYSICAL EXAM:  VITAL SIGNS: ED Triage Vitals  Enc Vitals Group     BP 02/17/21 0409 (!) 167/120     Pulse Rate 02/17/21 0409 (!) 111     Resp 02/17/21 0409 18     Temp 02/17/21 0409 98.2 F (36.8 C)     Temp Source 02/17/21 0409 Oral     SpO2 02/17/21 0409 96 %     Weight 02/17/21 0409 74.8 kg (165 lb)     Height 02/17/21 0409 1.829 m (6')  Head Circumference --      Peak Flow --      Pain Score 02/17/21 0408 0     Pain Loc --      Pain Edu? --      Excl. in [place]? --     Constitutional: Alert and oriented.  Eyes: Conjunctivae are normal.  Head: Atraumatic. Nose: No congestion/rhinnorhea. Mouth/Throat: Patient is wearing a mask. Neck: No stridor.  No meningeal signs.   Cardiovascular: Tachycardia, regular rhythm. Good peripheral circulation. Respiratory: Normal respiratory effort.  No retractions. Gastrointestinal: Soft and nontender. No distention.  Musculoskeletal: No lower extremity tenderness nor edema.  No gross deformities of extremities. Neurologic:  Normal speech and language. No gross focal neurologic deficits are appreciated.  Skin:  Skin is warm, dry and intact. Psychiatric: Mood and affect are relatively normal under the circumstances, but he is expressing paranoia and some flight of ideas and pressured speech.  He is also reporting some vague homicidal ideation.  Admits to drug abuse.  ____________________________________________   LABS (all labs ordered are listed, but only abnormal results are displayed)  Labs Reviewed  COMPREHENSIVE METABOLIC PANEL - Abnormal; Notable for the following components:      Result Value   Sodium 132 (*)    BUN 21 (*)    Creatinine, Ser 1.25 (*)    All other components within normal limits  SALICYLATE LEVEL - Abnormal; Notable for the following components:   Salicylate Lvl [id number] (*)    All other components within normal limits  ACETAMINOPHEN LEVEL - Abnormal; Notable for the following components:   Acetaminophen (Tylenol), Serum <10 (*)    All other components within normal limits  URINE DRUG SCREEN, QUALITATIVE ([facility] ONLY) - Abnormal; Notable for the following components:   Amphetamines, Ur Screen POSITIVE (*)    Cocaine Metabolite,Ur [place] POSITIVE (*)    Cannabinoid 50 Ng, Ur [place] POSITIVE (*)    All other components within normal limits  RESP PANEL BY RT-PCR (FLU A&B, COVID) ARPGX2  ETHANOL  CBC  CK   ____________________________________________   INITIAL IMPRESSION / MDM / ASSESSMENT AND PLAN / ED COURSE  As part of my medical decision making, I reviewed the following data within the electronic MEDICAL RECORD NUMBER Nursing notes reviewed and incorporated, Labs reviewed , Old chart reviewed, A consult was requested from this/these consultant(s) Psychiatry, and Notes from prior ED visits   Differential diagnosis includes, but is not limited to, substance-induced mood disorder, nonspecific substance intoxication, rhabdomyolysis, other  nonspecific psychosis.  Patient is tachycardic and is positive for, among other things, amphetamines and cocaine.  He has a very slight elevation of his CK which could be unremarkable but could be indicative of developing rhabdomyolysis.   I will order a peripheral IV, 1 L LR, and a CK level.  Otherwise his lab work is generally reassuring.  His CBC is unremarkable, respiratory viral panel is normal, acetaminophen and salicylate levels are within normal limits.  Patient will benefit from psychiatry consult after medical clearance.     Clinical Course as of 02/17/21 0755  Wed Feb 17, 2021  0720 Patient's heart rate has come down to 95, CK is normal, he is medically cleared at this point for psychiatric evaluation.  Discontinuing peripheral IV and consulting psychiatry.  The patient has been placed in psychiatric observation due to the need to provide a safe environment for the patient while obtaining psychiatric consultation and evaluation, as well as ongoing medical and medication management to treat the  patient's condition.  The patient has not been placed under full IVC at this time.   [CF]    Clinical Course User Index [CF] Hinda Kehr, MD     ____________________________________________  FINAL CLINICAL IMPRESSION(S) / ED DIAGNOSES  Final diagnoses:  Polysubstance abuse ([place])  Stimulant abuse ([place])  Visual hallucinations  Paranoia ([place])     MEDICATIONS GIVEN DURING THIS VISIT:  Medications  acetaminophen (TYLENOL) tablet 650 mg (650 mg Oral Given 02/17/21 0442)  lactated ringers bolus 1,000 mL (1,000 mLs Intravenous New Bag/Given 02/17/21 0615)  LORazepam (ATIVAN) injection 1 mg (1 mg Intravenous Given 02/17/21 0615)     ED Discharge Orders     None        Note:  This document was prepared using Dragon voice recognition software and may include unintentional dictation errors.   Hinda Kehr, MD 02/17/21 [phone]

## 2021-02-17 NOTE — ED Notes (Signed)
Pt given dinner tray and grape juice.

## 2021-02-17 NOTE — ED Notes (Signed)
VOL, re-eval this evening with possible D/C

## 2021-02-17 NOTE — ED Triage Notes (Signed)
Pt arrived via POV with reports of needing a mental health evaluation. Pt has odd-affect in triage. Pt denies any SI at this time, pt states "there is a lot going on"  When asked if he is hearing voices pt states "I might be"  then pt made the comment "I don't feel safe"  Pt admits to using cocaine and marijuana tonight.

## 2021-02-17 NOTE — Consult Note (Addendum)
[facility] Face-to-Face Psychiatry Consult   Reason for Consult:  AH/ substance abuse  Referring Physician:  EDP Patient Identification: Bradley Casey MRN:  [id number] Principal Diagnosis: Polysubstance abuse (HCC) Diagnosis:  Principal Problem:   Polysubstance abuse (HCC)   Total Time spent with patient: 45 minutes  Subjective:   Bradley Casey is a 30 y.o. male patient admitted with AH in context of substance abuse.  0900:  HPI:  Patient seen and chart reviewed. Patient is sleepy, but is able to articulate that he is not suicidal or homicidal. He states that he feels calmer than he did last night. Patient states that he heard voices that just "whisper" his name. These are not frightening to him, but he only feels like "killing the voices." No visual  hallucinations. No AH at this time.  Patient verbalizes that he realizes he needs treatment for substance abuse. UDS positive for amphetamines, cocaine, cannabinoids. He endorses depression as well. He is not working. Says he lives "with people" in [place]. States appetite and sleep are good. He was just recently discharged from our inpatient psychiatric unit and has not followed thru with recommendations.  Patient would like to be referred to inpatient substance use treatment if possible.    1530: Patient has been quite somnolent today. Gabapentin and haldol started by Nanine Means, NP. Will re-evaluate patient later this evening and could be discharged.   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   Risk to Self:   Risk to Others:   Prior Inpatient Therapy:   Prior Outpatient Therapy:    Past Medical History: History reviewed. No pertinent past medical history. History reviewed. No pertinent surgical history. Family History: History reviewed. No pertinent family history.  Family Psychiatric  History: none known Social History:  Social History   Substance and Sexual  Activity  Alcohol Use Yes     Social History   Substance and Sexual Activity  Drug Use Yes   Types: Marijuana, Cocaine    Social History   Socioeconomic History   Marital status: Single    Spouse name: Not on file   Number of children: Not on file   Years of education: Not on file   Highest education level: Not on file  Occupational History   Not on file  Tobacco Use   Smoking status: Some Days   Smokeless tobacco: Never  Vaping Use   Vaping Use: Never used  Substance and Sexual Activity   Alcohol use: Yes   Drug use: Yes    Types: Marijuana, Cocaine   Sexual activity: Yes  Other Topics Concern   Not on file  Social History Narrative   Not on file   Social Determinants of Health   Financial Resource Strain: Not on file  Food Insecurity: Not on file  Transportation Needs: Not on file  Physical Activity: Not on file  Stress: Not on file  Social Connections: Not on file   Additional Social History:    Allergies:  No Known Allergies  Labs:  Results for orders placed or performed during the hospital encounter of 02/17/21 (from the past 48 hour(s))  Comprehensive metabolic panel     Status: Abnormal   Collection Time: 02/17/21  4:18 AM  Result Value Ref Range   Sodium 132 (L) 135 - 145 mmol/L   Potassium 4.2 3.5 - 5.1 mmol/L   Chloride 102 98 - 111 mmol/L   CO2 25 22 - 32 mmol/L   Glucose, Bld  93 70 - 99 mg/dL    Comment: Glucose reference range applies only to samples taken after fasting for at least 8 hours.   BUN 21 (H) 6 - 20 mg/dL   Creatinine, Ser 4.401.25 (H) 0.61 - 1.24 mg/dL   Calcium 9.1 8.9 - 34.710.3 mg/dL   Total Protein 7.6 6.5 - 8.1 g/dL   Albumin 4.2 3.5 - 5.0 g/dL   AST 22 15 - 41 U/L   ALT 18 0 - 44 U/L   Alkaline Phosphatase 74 38 - 126 U/L   Total Bilirubin 0.7 0.3 - 1.2 mg/dL   GFR, Estimated >42>60 >59>60 mL/min    Comment: (NOTE) Calculated using the CKD-EPI Creatinine Equation (2021)    Anion gap 5 5 - 15    Comment: Performed at [facility], [address] [place], [place] [address]  Ethanol     Status: None   Collection Time: 02/17/21  4:18 AM  Result Value Ref Range   Alcohol, Ethyl (B) <10 <10 mg/dL    Comment: (NOTE) Lowest detectable limit for serum alcohol is 10 mg/dL.  For medical purposes only. Performed at [facility], [address] [place], [place] [address]   Salicylate level     Status: Abnormal   Collection Time: 02/17/21  4:18 AM  Result Value Ref Range   Salicylate Lvl <7.0 (L) 7.0 - 30.0 mg/dL    Comment: Performed at [facility], [address] [place], [place] [address]  Acetaminophen level     Status: Abnormal   Collection Time: 02/17/21  4:18 AM  Result Value Ref Range   Acetaminophen (Tylenol), Serum <10 (L) 10 - 30 ug/mL    Comment: (NOTE) Therapeutic concentrations vary significantly. A range of 10-30 ug/mL  may be an effective concentration for many patients. However, some  are best treated at concentrations outside of this range. Acetaminophen concentrations >150 ug/mL at 4 hours after ingestion  and >50 ug/mL at 12 hours after ingestion are often associated with  toxic reactions.  Performed at [facility], [address] [place], [place] [address]   cbc     Status: None   Collection Time: 02/17/21  4:18 AM  Result Value Ref Range   WBC 10.2 4.0 - 10.5 K/uL   RBC 5.30 4.22 - 5.81 MIL/uL   Hemoglobin 15.7 13.0 - 17.0 g/dL   HCT 06.346.1 01.639.0 - 01.052.0 %   MCV 87.0 80.0 - 100.0 fL   MCH 29.6 26.0 - 34.0 pg   MCHC 34.1 30.0 - 36.0 g/dL   RDW 93.212.7 35.511.5 - 73.215.5 %   Platelets 269 150 - 400 K/uL   nRBC 0.0 0.0 - 0.2 %    Comment: Performed at [facility], [address] [place], [place] [address]  Urine Drug Screen, Qualitative     Status: Abnormal   Collection Time: 02/17/21  4:18 AM  Result Value Ref Range   Tricyclic, Ur Screen NONE DETECTED NONE DETECTED   Amphetamines, Ur Screen POSITIVE (A) NONE DETECTED   MDMA (Ecstasy)Ur  Screen NONE DETECTED NONE DETECTED   Cocaine Metabolite,Ur [place] POSITIVE (A) NONE DETECTED   Opiate, Ur Screen NONE DETECTED NONE DETECTED   Phencyclidine (PCP) Ur S NONE DETECTED NONE DETECTED   Cannabinoid 50 Ng, Ur [place] POSITIVE (A) NONE DETECTED   Barbiturates, Ur Screen NONE DETECTED NONE DETECTED   Benzodiazepine, Ur Scrn NONE DETECTED NONE DETECTED   Methadone Scn, Ur NONE DETECTED NONE DETECTED  Comment: (NOTE) Tricyclics + metabolites, urine    Cutoff 1000 ng/mL Amphetamines + metabolites, urine  Cutoff 1000 ng/mL MDMA (Ecstasy), urine              Cutoff 500 ng/mL Cocaine Metabolite, urine          Cutoff 300 ng/mL Opiate + metabolites, urine        Cutoff 300 ng/mL Phencyclidine (PCP), urine         Cutoff 25 ng/mL Cannabinoid, urine                 Cutoff 50 ng/mL Barbiturates + metabolites, urine  Cutoff 200 ng/mL Benzodiazepine, urine              Cutoff 200 ng/mL Methadone, urine                   Cutoff 300 ng/mL  The urine drug screen provides only a preliminary, unconfirmed analytical test result and should not be used for non-medical purposes. Clinical consideration and professional judgment should be applied to any positive drug screen result due to possible interfering substances. A more specific alternate chemical method must be used in order to obtain a confirmed analytical result. Gas chromatography / mass spectrometry (GC/MS) is the preferred confirm atory method. Performed at [facility], [address] [place], [place] [address]   Resp Panel by RT-PCR (Flu A&B, Covid) Nasopharyngeal Swab     Status: None   Collection Time: 02/17/21  6:13 AM   Specimen: Nasopharyngeal Swab; Nasopharyngeal(NP) swabs in vial transport medium  Result Value Ref Range   SARS Coronavirus 2 by RT PCR NEGATIVE NEGATIVE    Comment: (NOTE) SARS-CoV-2 target nucleic acids are NOT DETECTED.  The SARS-CoV-2 RNA is generally detectable in upper respiratory specimens  during the acute phase of infection. The lowest concentration of SARS-CoV-2 viral copies this assay can detect is 138 copies/mL. A negative result does not preclude SARS-Cov-2 infection and should not be used as the sole basis for treatment or other patient management decisions. A negative result may occur with  improper specimen collection/handling, submission of specimen other than nasopharyngeal swab, presence of viral mutation(s) within the areas targeted by this assay, and inadequate number of viral copies(<138 copies/mL). A negative result must be combined with clinical observations, patient history, and epidemiological information. The expected result is Negative.  Fact Sheet for Patients:  EntrepreneurPulse.com.au  Fact Sheet for Healthcare Providers:  IncredibleEmployment.be  This test is no t yet approved or cleared by the [place] FDA and  has been authorized for detection and/or diagnosis of SARS-CoV-2 by FDA under an Emergency Use Authorization (EUA). This EUA will remain  in effect (meaning this test can be used) for the duration of the COVID-19 declaration under Section 564(b)(1) of the Act, 21 U.S.C.section 360bbb-3(b)(1), unless the authorization is terminated  or revoked sooner.       Influenza A by PCR NEGATIVE NEGATIVE   Influenza B by PCR NEGATIVE NEGATIVE    Comment: (NOTE) The Xpert Xpress SARS-CoV-2/FLU/RSV plus assay is intended as an aid in the diagnosis of influenza from Nasopharyngeal swab specimens and should not be used as a sole basis for treatment. Nasal washings and aspirates are unacceptable for Xpert Xpress SARS-CoV-2/FLU/RSV testing.  Fact Sheet for Patients: EntrepreneurPulse.com.au  Fact Sheet for Healthcare Providers: IncredibleEmployment.be  This test is not yet approved or cleared by the [place] FDA and has been authorized for detection and/or diagnosis  of SARS-CoV-2 by FDA  under an Emergency Use Authorization (EUA). This EUA will remain in effect (meaning this test can be used) for the duration of the COVID-19 declaration under Section 564(b)(1) of the Act, 21 U.S.C. section 360bbb-3(b)(1), unless the authorization is terminated or revoked.  Performed at [facility], [address] [place], [place] [address]   CK     Status: None   Collection Time: 02/17/21  6:30 AM  Result Value Ref Range   Total CK 83 49 - 397 U/L    Comment: Performed at [facility], [address] [place], [place] [address]    Current Facility-Administered Medications  Medication Dose Route Frequency Provider Last Rate Last Admin   folic acid (FOLVITE) tablet 1 mg  1 mg Oral Daily Lucrezia Starch, MD   1 mg at 02/17/21 1035   gabapentin (NEURONTIN) capsule 300 mg  300 mg Oral TID Patrecia Pour, NP   300 mg at 02/17/21 1609   haloperidol (HALDOL) tablet 2 mg  2 mg Oral BID Patrecia Pour, NP   2 mg at 02/17/21 1609   LORazepam (ATIVAN) tablet 1 mg  1 mg Oral Q6H PRN Patrecia Pour, NP       multivitamin with minerals tablet 1 tablet  1 tablet Oral Daily Lucrezia Starch, MD   1 tablet at 02/17/21 1035   thiamine tablet 100 mg  100 mg Oral Daily Lucrezia Starch, MD   100 mg at 02/17/21 1035   Or   thiamine (B-1) injection 100 mg  100 mg Intravenous Daily Lucrezia Starch, MD       Current Outpatient Medications  Medication Sig Dispense Refill   benztropine (COGENTIN) 0.5 MG tablet Take 1 tablet (0.5 mg total) by mouth daily. 30 tablet 1   FLUoxetine (PROZAC) 20 MG capsule Take 1 capsule (20 mg total) by mouth daily. 30 capsule 1   OLANZapine (ZYPREXA) 15 MG tablet Take 1 tablet (15 mg total) by mouth at bedtime. 30 tablet 1    Musculoskeletal: Strength & Muscle Tone: within normal limits Gait & Station: normal Patient leans: N/A            Psychiatric Specialty Exam:  Presentation  General Appearance:  Disheveled  Eye Contact:Fleeting (SLEEPY)  Speech:Clear and Coherent  Speech Volume:Decreased  Handedness:Right   Mood and Affect  Mood:Depressed  Affect:Blunt   Thought Process  Thought Processes:Coherent  Descriptions of Associations:Intact  Orientation:Full (Time, Place and Person)  Thought Content:WDL  History of Schizophrenia/Schizoaffective disorder:No  Duration of Psychotic Symptoms:Less than six months  Hallucinations:Hallucinations: Auditory Description of Auditory Hallucinations: "whispering my name"  Ideas of Reference:None  Suicidal Thoughts:Suicidal Thoughts: No  Homicidal Thoughts:Homicidal Thoughts: No   Sensorium  Memory:Immediate Poor  Judgment:Poor  Insight:Poor   Executive Functions  Concentration:Poor  Attention Span:Poor  Recall:Poor  Fund of Knowledge:Poor  Language:Poor   Psychomotor Activity  Psychomotor Activity:Psychomotor Activity: Decreased   Assets  Assets:Communication Skills; Desire for Improvement; Intimacy; Leisure Time; Resilience; Social Support   Sleep  Sleep:Sleep: Fair   Physical Exam: Physical Exam Vitals and nursing note reviewed.  Constitutional:      Comments: Sleepy   HENT:     Head: Normocephalic.     Nose: No congestion or rhinorrhea.  Eyes:     General:        Right eye: No discharge.        Left eye: No discharge.  Cardiovascular:     Rate and Rhythm: Normal rate.  Pulmonary:     Effort: Pulmonary effort is normal.  Musculoskeletal:        General: Normal range of motion.     Cervical back: Normal range of motion.  Skin:    General: Skin is dry.  Neurological:     Mental Status: He is oriented to person, place, and time.  Psychiatric:        Attention and Perception: He is inattentive.        Mood and Affect: Affect is blunt.        Speech: Speech normal.        Behavior: Behavior is slowed.        Thought Content: Thought content normal.        Cognition and Memory:  Cognition normal.        Judgment: Judgment is impulsive.     Comments: Sleepy, had ativan prior    Review of Systems  Constitutional: Negative.   HENT: Negative.    Eyes: Negative.   Respiratory: Negative.    Cardiovascular: Negative.   Gastrointestinal: Negative.   Genitourinary: Negative.   Musculoskeletal: Negative.   Skin: Negative.   Neurological: Negative.   Endo/Heme/Allergies: Negative.   Psychiatric/Behavioral:  Positive for depression. Negative for hallucinations, memory loss, substance abuse and suicidal ideas. The patient is not nervous/anxious and does not have insomnia.   Blood pressure (!) 156/108, pulse 82, temperature 98.2 F (36.8 C), temperature source Oral, resp. rate 18, height 6' (1.829 m), weight 74.8 kg, SpO2 100 %. Body mass index is 22.38 kg/m.  Treatment Plan Summary: Medication management. Refer to inpatient substance use treatment centers. Reviewed with EDP.   Disposition:  re-evaluate this evening and patient may discharge if he is less somnolent and remains free from suicidal or homicidal ideations or hallucinations.    Sherlon Handing, NP 02/17/2021 4:20 PM

## 2021-02-17 NOTE — ED Notes (Signed)
Crackers and peanut butter given 

## 2021-02-17 NOTE — ED Notes (Signed)
Patient transferred from ED to [facility] room 2 after screening for contraband. Report received from [place], RN including Situation, Background, Assessment and Recommendations. Pt oriented to unit including Q15 minute rounds as well as the security cameras for their protection. Patient is alert and oriented, warm and dry in no acute distress. Patient denies SI, HI, and AVH. Pt. Encouraged to let this nurse know if needs arise.

## 2021-02-17 NOTE — ED Notes (Signed)
Pt belongings:  Cell phone, tennis shoes, 1 pair of sweat pants, black coat, t-shirt.

## 2021-02-17 NOTE — ED Notes (Signed)
Pt is asleep at this time, will ask pt for snack later

## 2021-02-17 NOTE — ED Notes (Signed)
Pt requesting medication for HA - given tylenol.

## 2021-02-17 NOTE — ED Notes (Signed)
Pt voluntary 

## 2021-02-17 NOTE — BH Assessment (Signed)
Comprehensive Clinical Assessment (CCA) Screening, Triage and Referral Note  02/17/2021 Bradley Casey [id number]  Bradley Casey, 30 year old male who presents to [facility] ED involuntarily for treatment. Per triage note, Pt arrived via POV with reports needing a mental health evaluation. Pt has odd affect in triage. Pt denies any SI at this time, pt states "there is a lot going on" when asked if he is hearing voices pt states "I might be" then pt made the comment "I don't feel safe". Pt admits to using cocaine and marijuana tonight.   During TTS assessment pt presents alert and oriented x 4, anxious but cooperative, and mood-congruent with affect. The pt does not appear to be responding to internal or external stimuli. Neither is the pt presenting with any delusional thinking. Pt verified the information provided to triage RN.   Pt identifies his main complaint as hearing whispers in his ear. Patient reports having auditory hallucinations where someone with a threatening tone is calling his name. Patient reports he is non-compliant with his medications and did not follow up when he was discharged a few weeks ago from inpatient treatment. Patient reports he is not currently working and lives with family members. Patient admits to current drug use with cocaine and marijuana and some occasional drinking. I smoke a couple of times a week. Patient has good insight and realizes that he needs help with substance use but has yet to act on any detox treatment. 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 Pt denies current SI/HI/AH/VH.    Per Sallye Ober, NP, pt is recommended for overnight observation to be reassessed in the morning.    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  Family/Friend  What Is the Reason for Your Visit/Call Today? Patient endorsing auditory hallucinations. Patient reports hearing whispers in his ear.  How Long Has This Been Causing You Problems? <Week  What Do You Feel Would Help You the Most Today? Medication(s) (Assessment)   Have You Recently Had Any Thoughts About Hurting Yourself? No  Are You Planning to Commit Suicide/Harm Yourself At This time? No   Have you Recently Had Thoughts About Hurting Someone Bradley Casey? No  Are You Planning to Harm Someone at This Time? No  Explanation: No data recorded  Have You Used Any Alcohol or Drugs in the Past 24 Hours? Yes  How Long Ago Did You Use Drugs or Alcohol? No data recorded What Did You Use and How Much? Cocaine and marijuana   Do You Currently Have a Therapist/Psychiatrist? No  Name of Therapist/Psychiatrist: No data recorded  Have You Been Recently Discharged From Any Office Practice or Programs? No  Explanation of Discharge From Practice/Program: No data recorded   CCA Screening Triage Referral Assessment Type of Contact: Face-to-Face  Telemedicine Service Delivery:   Is this Initial or Reassessment? No data recorded Date Telepsych consult ordered in [facility]:  No data recorded Time Telepsych consult ordered in [facility]:  No data recorded Location of Assessment: [facility] ED  Provider Location: [facility] ED   Collateral Involvement: None provided   Does Patient Have a Court Appointed Legal Guardian? No data recorded Name and Contact of Legal Guardian: No data recorded If Minor and Not Living with Parent(s), Who has Custody? n/a  Is CPS involved or ever been  involved? Never  Is APS involved or ever been involved? Never   Patient Determined To Be At Risk for Harm To Self or Others Based on Review of Patient Reported Information or Presenting Complaint? No  Method: No data recorded Availability of Means: No data recorded Intent: No data recorded Notification Required: No data  recorded Additional Information for Danger to Others Potential: No data recorded Additional Comments for Danger to Others Potential: No data recorded Are There Guns or Other Weapons in Your Home? No data recorded Types of Guns/Weapons: No data recorded Are These Weapons Safely Secured?                            No data recorded Who Could Verify You Are Able To Have These Secured: No data recorded Do You Have any Outstanding Charges, Pending Court Dates, Parole/Probation? No data recorded Contacted To Inform of Risk of Harm To Self or Others: No data recorded  Does Patient Present under Involuntary Commitment? No  IVC Papers Initial File Date: No data recorded  [place] of Residence: [place]   Patient Currently Receiving the Following Services: Not Receiving Services   Determination of Need: Urgent (48 hours)   Options For Referral: Medication Management; Intensive Outpatient Therapy; Chemical Dependency Intensive Outpatient Therapy (CDIOP)   Discharge Disposition:     Clerance Lav, Counselor, LCAS-A

## 2021-02-18 NOTE — Consult Note (Signed)
°  No inpatient substance abuse treatment programs have been found per TTS. Patient is insistent that he will be killed by someone if he is discharged. He states people are after him and he is in danger, and if he leaves "we will find him dead in the parking lot." Patient is very difficult to follow in conversation. He is irritable, insisting that he is not safe to leave. He denies suicidal or homicidal; ideations. Denies auditory or visual hallucinations. Will re-evaluate tomorrow.

## 2021-02-18 NOTE — ED Provider Notes (Signed)
Emergency Medicine Observation Re-evaluation Note  Bradley Casey is a 30 y.o. male, seen on rounds today.  Pt initially presented to the ED for complaints of Psychiatric Evaluation Currently, the patient is resting comfortably.   Physical Exam  BP (!) 147/80 (BP Location: Left Arm)    Pulse 86    Temp 98.5 F (36.9 C) (Oral)    Resp 17    Ht 6' (1.829 m)    Wt 74.8 kg    SpO2 100%    BMI 22.38 kg/m  Physical Exam General: no distress  Lungs: no resp distress Psych: no agitation   ED Course / MDM  EKG:   I have reviewed the labs performed to date as well as medications administered while in observation.  Recent changes in the last 24 hours include none  Plan  Current plan is for TOC/SW placement  Bradley Casey is not under involuntary commitment.     Georga Hacking, MD 02/18/21 Windy Fast

## 2021-02-18 NOTE — ED Notes (Signed)
Psych NP at bedside

## 2021-02-18 NOTE — Consult Note (Addendum)
[facility] Face-to-Face Psychiatry Consult   Reason for Consult:  reassessment Referring Physician:  EDP Patient Identification: Bradley Casey MRN:  [id number] Principal Diagnosis: Polysubstance abuse (HCC) Diagnosis:  Principal Problem:   Polysubstance abuse (HCC)   Total Time spent with patient: 20 minutes  Subjective:   Bradley Casey is a 30 y.o. male patient admitted with see previous consult .  HPI: See previous consult note:  Patient is seen face-to-face. He is alert and oriented x 4. He denies suicidal or homicidal ideation, although endorses depression. Patient is denying current auditory or visual hallucinations. Denies paranoia. Speaks in linear sentences. He states that he has not followed up with [facility] and isn't taking the medication that was prescribed at discharge a few weeks ago because he doesn't want to. Patient is requesting discharge to substance use treatment facility, preferably inpatient.   Past Psychiatric History: see previous  Risk to Self:   Risk to Others:   Prior Inpatient Therapy:   Prior Outpatient Therapy:    Past Medical History: History reviewed. No pertinent past medical history. History reviewed. No pertinent surgical history. Family History: History reviewed. No pertinent family history. Family Psychiatric  History: see previous Social History:  Social History   Substance and Sexual Activity  Alcohol Use Yes     Social History   Substance and Sexual Activity  Drug Use Yes   Types: Marijuana, Cocaine    Social History   Socioeconomic History   Marital status: Single    Spouse name: Not on file   Number of children: Not on file   Years of education: Not on file   Highest education level: Not on file  Occupational History   Not on file  Tobacco Use   Smoking status: Some Days   Smokeless tobacco: Never  Vaping Use   Vaping Use: Never used  Substance and Sexual Activity   Alcohol use: Yes   Drug use: Yes    Types: Marijuana, Cocaine    Sexual activity: Yes  Other Topics Concern   Not on file  Social History Narrative   Not on file   Social Determinants of Health   Financial Resource Strain: Not on file  Food Insecurity: Not on file  Transportation Needs: Not on file  Physical Activity: Not on file  Stress: Not on file  Social Connections: Not on file   Additional Social History:    Allergies:  No Known Allergies  Labs:  Results for orders placed or performed during the hospital encounter of 02/17/21 (from the past 48 hour(s))  Comprehensive metabolic panel     Status: Abnormal   Collection Time: 02/17/21  4:18 AM  Result Value Ref Range   Sodium 132 (L) 135 - 145 mmol/L   Potassium 4.2 3.5 - 5.1 mmol/L   Chloride 102 98 - 111 mmol/L   CO2 25 22 - 32 mmol/L   Glucose, Bld 93 70 - 99 mg/dL    Comment: Glucose reference range applies only to samples taken after fasting for at least 8 hours.   BUN 21 (H) 6 - 20 mg/dL   Creatinine, Ser 9.14 (H) 0.61 - 1.24 mg/dL   Calcium 9.1 8.9 - 78.2 mg/dL   Total Protein 7.6 6.5 - 8.1 g/dL   Albumin 4.2 3.5 - 5.0 g/dL   AST 22 15 - 41 U/L   ALT 18 0 - 44 U/L   Alkaline Phosphatase 74 38 - 126 U/L   Total Bilirubin 0.7 0.3 - 1.2 mg/dL  GFR, Estimated >60 >60 mL/min    Comment: (NOTE) Calculated using the CKD-EPI Creatinine Equation (2021)    Anion gap 5 5 - 15    Comment: Performed at [facility], [address] [place], [place] [address]  Ethanol     Status: None   Collection Time: 02/17/21  4:18 AM  Result Value Ref Range   Alcohol, Ethyl (B) <10 <10 mg/dL    Comment: (NOTE) Lowest detectable limit for serum alcohol is 10 mg/dL.  For medical purposes only. Performed at [facility], [address] [place], [place] [address]   Salicylate level     Status: Abnormal   Collection Time: 02/17/21  4:18 AM  Result Value Ref Range   Salicylate Lvl <7.0 (L) 7.0 - 30.0 mg/dL    Comment: Performed at [facility], [address] [place], [place] [address]  Acetaminophen level     Status: Abnormal   Collection Time: 02/17/21  4:18 AM  Result Value Ref Range   Acetaminophen (Tylenol), Serum <10 (L) 10 - 30 ug/mL    Comment: (NOTE) Therapeutic concentrations vary significantly. A range of 10-30 ug/mL  may be an effective concentration for many patients. However, some  are best treated at concentrations outside of this range. Acetaminophen concentrations >150 ug/mL at 4 hours after ingestion  and >50 ug/mL at 12 hours after ingestion are often associated with  toxic reactions.  Performed at [facility], [address] [place], [place] [address]   cbc     Status: None   Collection Time: 02/17/21  4:18 AM  Result Value Ref Range   WBC 10.2 4.0 - 10.5 K/uL   RBC 5.30 4.22 - 5.81 MIL/uL   Hemoglobin 15.7 13.0 - 17.0 g/dL   HCT 68.1 27.5 - 17.0 %   MCV 87.0 80.0 - 100.0 fL   MCH 29.6 26.0 - 34.0 pg   MCHC 34.1 30.0 - 36.0 g/dL   RDW 01.7 49.4 - 49.6 %   Platelets 269 150 - 400 K/uL   nRBC 0.0 0.0 - 0.2 %    Comment: Performed at [facility], [address] [place], [place] [address]  Urine Drug Screen, Qualitative     Status: Abnormal   Collection Time: 02/17/21  4:18 AM  Result Value Ref Range   Tricyclic, Ur Screen NONE DETECTED NONE DETECTED   Amphetamines, Ur Screen POSITIVE (A) NONE DETECTED   MDMA (Ecstasy)Ur Screen NONE DETECTED NONE DETECTED   Cocaine Metabolite,Ur [place] POSITIVE (A) NONE DETECTED   Opiate, Ur Screen NONE DETECTED NONE DETECTED   Phencyclidine (PCP) Ur S NONE DETECTED NONE DETECTED   Cannabinoid 50 Ng, Ur [place] POSITIVE (A) NONE DETECTED   Barbiturates, Ur Screen NONE DETECTED NONE DETECTED   Benzodiazepine, Ur Scrn NONE DETECTED NONE DETECTED   Methadone Scn, Ur NONE DETECTED NONE DETECTED    Comment: (NOTE) Tricyclics + metabolites, urine    Cutoff 1000 ng/mL Amphetamines + metabolites, urine  Cutoff 1000 ng/mL MDMA (Ecstasy), urine              Cutoff 500  ng/mL Cocaine Metabolite, urine          Cutoff 300 ng/mL Opiate + metabolites, urine        Cutoff 300 ng/mL Phencyclidine (PCP), urine         Cutoff 25 ng/mL Cannabinoid, urine                 Cutoff 50 ng/mL  Barbiturates + metabolites, urine  Cutoff 200 ng/mL Benzodiazepine, urine              Cutoff 200 ng/mL Methadone, urine                   Cutoff 300 ng/mL  The urine drug screen provides only a preliminary, unconfirmed analytical test result and should not be used for non-medical purposes. Clinical consideration and professional judgment should be applied to any positive drug screen result due to possible interfering substances. A more specific alternate chemical method must be used in order to obtain a confirmed analytical result. Gas chromatography / mass spectrometry (GC/MS) is the preferred confirm atory method. Performed at [facility], [address] [place], [place] [address]   Resp Panel by RT-PCR (Flu A&B, Covid) Nasopharyngeal Swab     Status: None   Collection Time: 02/17/21  6:13 AM   Specimen: Nasopharyngeal Swab; Nasopharyngeal(NP) swabs in vial transport medium  Result Value Ref Range   SARS Coronavirus 2 by RT PCR NEGATIVE NEGATIVE    Comment: (NOTE) SARS-CoV-2 target nucleic acids are NOT DETECTED.  The SARS-CoV-2 RNA is generally detectable in upper respiratory specimens during the acute phase of infection. The lowest concentration of SARS-CoV-2 viral copies this assay can detect is 138 copies/mL. A negative result does not preclude SARS-Cov-2 infection and should not be used as the sole basis for treatment or other patient management decisions. A negative result may occur with  improper specimen collection/handling, submission of specimen other than nasopharyngeal swab, presence of viral mutation(s) within the areas targeted by this assay, and inadequate number of viral copies(<138 copies/mL). A negative result must be combined  with clinical observations, patient history, and epidemiological information. The expected result is Negative.  Fact Sheet for Patients:  BloggerCourse.com  Fact Sheet for Healthcare Providers:  SeriousBroker.it  This test is no t yet approved or cleared by the [place] FDA and  has been authorized for detection and/or diagnosis of SARS-CoV-2 by FDA under an Emergency Use Authorization (EUA). This EUA will remain  in effect (meaning this test can be used) for the duration of the COVID-19 declaration under Section 564(b)(1) of the Act, 21 U.S.C.section 360bbb-3(b)(1), unless the authorization is terminated  or revoked sooner.       Influenza A by PCR NEGATIVE NEGATIVE   Influenza B by PCR NEGATIVE NEGATIVE    Comment: (NOTE) The Xpert Xpress SARS-CoV-2/FLU/RSV plus assay is intended as an aid in the diagnosis of influenza from Nasopharyngeal swab specimens and should not be used as a sole basis for treatment. Nasal washings and aspirates are unacceptable for Xpert Xpress SARS-CoV-2/FLU/RSV testing.  Fact Sheet for Patients: BloggerCourse.com  Fact Sheet for Healthcare Providers: SeriousBroker.it  This test is not yet approved or cleared by the [place] FDA and has been authorized for detection and/or diagnosis of SARS-CoV-2 by FDA under an Emergency Use Authorization (EUA). This EUA will remain in effect (meaning this test can be used) for the duration of the COVID-19 declaration under Section 564(b)(1) of the Act, 21 U.S.C. section 360bbb-3(b)(1), unless the authorization is terminated or revoked.  Performed at [facility], [address] [place], [place] [address]   CK     Status: None   Collection Time: 02/17/21  6:30 AM  Result Value Ref Range   Total CK 83 49 - 397 U/L    Comment: Performed at [facility], [address]  [place], [place] [address]  Current Facility-Administered Medications  Medication Dose Route Frequency Provider Last Rate Last Admin   folic acid (FOLVITE) tablet 1 mg  1 mg Oral Daily Gilles Chiquito, MD   1 mg at 02/18/21 0841   gabapentin (NEURONTIN) capsule 300 mg  300 mg Oral TID Charm Rings, NP   300 mg at 02/18/21 0840   haloperidol (HALDOL) tablet 2 mg  2 mg Oral BID Charm Rings, NP   2 mg at 02/18/21 0840   LORazepam (ATIVAN) tablet 1 mg  1 mg Oral Q6H PRN Charm Rings, NP   1 mg at 02/17/21 1809   multivitamin with minerals tablet 1 tablet  1 tablet Oral Daily Gilles Chiquito, MD   1 tablet at 02/18/21 0840   thiamine tablet 100 mg  100 mg Oral Daily Gilles Chiquito, MD   100 mg at 02/18/21 0840   Or   thiamine (B-1) injection 100 mg  100 mg Intravenous Daily Gilles Chiquito, MD       Current Outpatient Medications  Medication Sig Dispense Refill   benztropine (COGENTIN) 0.5 MG tablet Take 1 tablet (0.5 mg total) by mouth daily. 30 tablet 1   FLUoxetine (PROZAC) 20 MG capsule Take 1 capsule (20 mg total) by mouth daily. 30 capsule 1   OLANZapine (ZYPREXA) 15 MG tablet Take 1 tablet (15 mg total) by mouth at bedtime. 30 tablet 1    Musculoskeletal: Strength & Muscle Tone: within normal limits Gait & Station: normal Patient leans: N/A            Psychiatric Specialty Exam:  Presentation  General Appearance: Appropriate for Environment  Eye Contact:Fair  Speech:Clear and Coherent  Speech Volume:Normal  Handedness:Right   Mood and Affect  Mood:Depressed  Affect:Appropriate   Thought Process  Thought Processes:Coherent  Descriptions of Associations:Intact  Orientation:Full (Time, Place and Person)  Thought Content:WDL  History of Schizophrenia/Schizoaffective disorder:No  Duration of Psychotic Symptoms:Less than six months  Hallucinations:Hallucinations: None Description of Auditory Hallucinations: "whispering my name"  Ideas of  Reference:None  Suicidal Thoughts:Suicidal Thoughts: No  Homicidal Thoughts:Homicidal Thoughts: No   Sensorium  Memory:Immediate Good  Judgment:Poor  Insight:Fair   Executive Functions  Concentration:Fair  Attention Span:Fair  Recall:Fair  Fund of Knowledge:Fair  Language:Fair   Psychomotor Activity  Psychomotor Activity:Psychomotor Activity: Normal   Assets  Assets:Communication Skills; Desire for Improvement; Intimacy; Leisure Time; Resilience; Social Support   Sleep  Sleep:Sleep: Good   Physical Exam: Physical Exam Vitals and nursing note reviewed.  HENT:     Head: Normocephalic.     Nose: No congestion or rhinorrhea.  Eyes:     General:        Right eye: No discharge.        Left eye: No discharge.  Musculoskeletal:        General: Normal range of motion.     Cervical back: Normal range of motion.  Skin:    General: Skin is dry.  Neurological:     Mental Status: He is alert and oriented to person, place, and time.  Psychiatric:        Attention and Perception: Attention normal.        Mood and Affect: Mood is depressed.        Speech: Speech normal.        Behavior: Behavior normal.        Thought Content: Thought content normal. Thought content does not include homicidal or suicidal ideation.  Cognition and Memory: Cognition normal.   Review of Systems  Psychiatric/Behavioral:  Positive for depression (stable). Negative for hallucinations, memory loss, substance abuse and suicidal ideas. The patient is not nervous/anxious and does not have insomnia.   All other systems reviewed and are negative. Blood pressure (!) 147/80, pulse 86, temperature 98.5 F (36.9 C), temperature source Oral, resp. rate 17, height 6' (1.829 m), weight 74.8 kg, SpO2 100 %. Body mass index is 22.38 kg/m.  Treatment Plan Summary: Patient is a 30 year old male who presented to the ED under influence of substances. Patient has had several similar presentations  and is requesting substance use rehab, preferably inpatient. He is denying SI/HI/AVH at this time. Reviewed with TTS for appropriate referrals.   Disposition: No evidence of imminent risk to self or others at present.   Supportive therapy provided about ongoing stressors. Discussed crisis plan, support from social network, calling 911, coming to the Emergency Department, and calling Suicide Hotline.  Vanetta Mulders, NP 02/18/2021 10:17 AM

## 2021-02-18 NOTE — ED Notes (Signed)
VOL/pending re-eval w/possible D/C

## 2021-02-18 NOTE — ED Notes (Signed)
Report to include Situation, Background, Assessment, and Recommendations received from Amy RN. Patient alert and oriented, warm and dry, in no acute distress. Patient denies SI, HI, AVH and pain. Patient made aware of Q15 minute rounds and security cameras for their safety. Patient instructed to come to me with needs or concerns.  

## 2021-02-18 NOTE — BH Assessment (Signed)
Referral information for Psychiatric Hospitalization faxed to;   [facility] [phone])  [facility] [phone]), pending review.  [place] [phone]), declined

## 2021-02-18 NOTE — Progress Notes (Signed)
Bradley Casey is a 30 y.o. male who presents voluntarily stating that he needs a mental health exam. The patient has been sleeping most of the night. It would be safer for the patient to be d/c in the morning. The patient is not voicing suicidal ideation.

## 2021-02-19 DIAGNOSIS — F14951 Cocaine use, unspecified with cocaine-induced psychotic disorder with hallucinations: Secondary | ICD-10-CM | POA: Diagnosis not present

## 2021-02-19 NOTE — BH Assessment (Addendum)
Referral Checks:  [facility] [phone]) Nurse reports referral not received, referral re-faxed at 3:19am 02/19/21. Nurse also reports that patient's positive UDS for amphetamines, cocaine and marijuana does not meet detox criteria but is still willing to review patient  Update 02/19/21 4:15am: [facility] nurse contacts TTS and reports a bed is available for patient after 7am but patient has to bring any outside medications with him prior to arrival

## 2021-02-19 NOTE — ED Notes (Signed)
VS not taken, patient asleep 

## 2021-02-19 NOTE — ED Notes (Signed)
Called [facility] left message,  Ephriam Knuckles from [place]  called transport set up  will be here shortly  [phone]

## 2021-02-19 NOTE — BH Assessment (Signed)
Writer spoke with [facility] [phone]), and informed him he does not take any medications. They stated he is still good to come and expecting him.   Writer updated ER [facility])  Address: [address], [place],  [place] [address]

## 2021-02-19 NOTE — Consult Note (Signed)
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 male patient admitted with stimulant abuse with psychosis.  Patient is seen face-to-face. He is alert and oriented x 4. He denies suicidal or homicidal ideation, although endorses mild depression related to people in the drug arena being after him. Patient is denying current auditory or visual hallucinations. Denies paranoia. Speaks in linear sentences. He requested rehab and [facility] accepted him, transferring later today.  Psychiatrically cleared.  Past Psychiatric History: see previous  Risk to Self:  none Risk to Others:  none Prior Inpatient Therapy:  none Prior Outpatient Therapy:  none  Past Medical History: History reviewed. No pertinent past medical history. History reviewed. No pertinent surgical history. Family History: History reviewed. No pertinent family history. Family Psychiatric  History: see previous Social History:  Social History   Substance and Sexual Activity  Alcohol Use Yes     Social History   Substance and Sexual Activity  Drug Use Yes   Types: Marijuana, Cocaine    Social History   Socioeconomic History   Marital status: Single    Spouse name: Not on file   Number of children: Not on file   Years of education: Not on file   Highest education level: Not on file  Occupational History   Not on file  Tobacco Use   Smoking status: Some Days   Smokeless tobacco: Never  Vaping Use   Vaping Use: Never used  Substance and Sexual Activity   Alcohol use: Yes   Drug use: Yes    Types:  Marijuana, Cocaine   Sexual activity: Yes  Other Topics Concern   Not on file  Social History Narrative   Not on file   Social Determinants of Health   Financial Resource Strain: Not on file  Food Insecurity: Not on file  Transportation Needs: Not on file  Physical Activity: Not on file  Stress: Not on file  Social Connections: Not on file   Additional Social History:    Allergies:  No Known Allergies  Labs:  No results found for this or any previous visit (from the past 48 hour(s)).   Current Facility-Administered Medications  Medication Dose Route Frequency Provider Last Rate Last Admin   gabapentin (NEURONTIN) capsule 300 mg  300 mg Oral TID Patrecia Pour, NP   300 mg at 02/19/21 0913   haloperidol (HALDOL) tablet 2 mg  2 mg Oral BID Patrecia Pour, NP   2 mg at 02/19/21 [id number]   Current Outpatient Medications  Medication Sig Dispense Refill   benztropine (COGENTIN) 0.5 MG tablet Take 1 tablet (0.5 mg total) by mouth daily. 30 tablet 1   FLUoxetine (PROZAC) 20 MG capsule Take 1 capsule (20 mg total) by mouth daily. 30 capsule 1   OLANZapine (ZYPREXA) 15 MG tablet Take 1 tablet (15 mg total) by mouth at bedtime. 30 tablet 1    Musculoskeletal: Strength & Muscle Tone: within normal limits Gait & Station: normal Patient leans: N/A  Psychiatric Specialty Exam: Physical Exam Vitals and nursing note reviewed.  HENT:     Head: Normocephalic.     Nose: Nose normal. No congestion  or rhinorrhea.  Eyes:     General:        Right eye: No discharge.        Left eye: No discharge.  Pulmonary:     Effort: Pulmonary effort is normal.  Musculoskeletal:        General: Normal range of motion.     Cervical back: Normal range of motion.  Neurological:     General: No focal deficit present.     Mental Status: He is alert and oriented to person, place, and time.  Psychiatric:        Attention and Perception: Attention normal.        Mood and Affect: Mood is anxious.         Speech: Speech normal.        Behavior: Behavior normal.        Thought Content: Thought content normal. Thought content does not include homicidal or suicidal ideation.        Cognition and Memory: Cognition and memory normal.        Judgment: Judgment normal.    Review of Systems  Psychiatric/Behavioral:  Positive for substance abuse. Negative for hallucinations, memory loss and suicidal ideas. Depression: stable.The patient is nervous/anxious. The patient does not have insomnia.   All other systems reviewed and are negative.  Blood pressure 131/86, pulse 88, temperature 98.3 F (36.8 C), temperature source Oral, resp. rate 18, height 6' (1.829 m), weight 74.8 kg, SpO2 96 %.Body mass index is 22.38 kg/m.  General Appearance: Casual  Eye Contact:  Good  Speech:  Normal Rate  Volume:  Normal  Mood:  Anxious  Affect:  Congruent  Thought Process:  Coherent and Descriptions of Associations: Intact  Orientation:  Full (Time, Place, and Person)  Thought Content:  WDL and Logical  Suicidal Thoughts:  No  Homicidal Thoughts:  No  Memory:  Immediate;   Good Recent;   Good Remote;   Good  Judgement:  Fair  Insight:  Fair  Psychomotor Activity:  Normal  Concentration:  Concentration: Good and Attention Span: Good  Recall:  Good  Fund of Knowledge:  Fair  Language:  Good  Akathisia:  No  Handed:  Right  AIMS (if indicated):     Assets:  Leisure Time Physical Health Resilience Social Support  ADL's:  Intact  Cognition:  WNL  Sleep:         Physical Exam: Physical Exam Vitals and nursing note reviewed.  HENT:     Head: Normocephalic.     Nose: Nose normal. No congestion or rhinorrhea.  Eyes:     General:        Right eye: No discharge.        Left eye: No discharge.  Pulmonary:     Effort: Pulmonary effort is normal.  Musculoskeletal:        General: Normal range of motion.     Cervical back: Normal range of motion.  Neurological:     General: No focal deficit  present.     Mental Status: He is alert and oriented to person, place, and time.  Psychiatric:        Attention and Perception: Attention normal.        Mood and Affect: Mood is anxious.        Speech: Speech normal.        Behavior: Behavior normal.        Thought Content: Thought content normal. Thought content does not include homicidal or  suicidal ideation.        Cognition and Memory: Cognition and memory normal.        Judgment: Judgment normal.   Review of Systems  Psychiatric/Behavioral:  Positive for substance abuse. Negative for hallucinations, memory loss and suicidal ideas. Depression: stable.The patient is nervous/anxious. The patient does not have insomnia.   All other systems reviewed and are negative. Blood pressure 131/86, pulse 88, temperature 98.3 F (36.8 C), temperature source Oral, resp. rate 18, height 6' (1.829 m), weight 74.8 kg, SpO2 96 %. Body mass index is 22.38 kg/m.  Treatment Plan Summary: Patient is a 30 year old male who presented to the ED under influence of substances. Patient has had several similar presentations and is requesting substance use rehab, preferably inpatient. He is denying SI/HI/AVH at this time. Reviewed with TTS for appropriate referrals.  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   Supportive therapy provided about ongoing stressors. Discussed crisis plan, support from social network, calling 911, coming to the Emergency Department, and calling Suicide Hotline.  Nanine Means, NP 02/19/2021 10:18 AM

## 2021-02-19 NOTE — ED Provider Notes (Signed)
Patient seen and cleared for discharge by psychiatry.  Discharged with plan to follow-up at [facility].   Gilles Chiquito, MD 02/19/21 [phone]

## 2021-02-19 NOTE — ED Notes (Addendum)
Snack and beverage given. 

## 2021-03-09 ENCOUNTER — Encounter: Payer: Self-pay | Admitting: Emergency Medicine

## 2021-03-09 ENCOUNTER — Emergency Department
Admission: EM | Admit: 2021-03-09 | Discharge: 2021-03-09 | Disposition: A | Discharge status: Home / Self Care | Payer: 59 | Attending: Emergency Medicine | Admitting: Emergency Medicine

## 2021-03-09 ENCOUNTER — Other Ambulatory Visit: Payer: Self-pay

## 2021-03-09 DIAGNOSIS — F1290 Cannabis use, unspecified, uncomplicated: Secondary | ICD-10-CM

## 2021-03-09 DIAGNOSIS — F14951 Cocaine use, unspecified with cocaine-induced psychotic disorder with hallucinations: Secondary | ICD-10-CM | POA: Diagnosis not present

## 2021-03-09 DIAGNOSIS — F1410 Cocaine abuse, uncomplicated: Secondary | ICD-10-CM

## 2021-03-09 DIAGNOSIS — R000 Tachycardia, unspecified: Secondary | ICD-10-CM | POA: Insufficient documentation

## 2021-03-09 DIAGNOSIS — F14151 Cocaine abuse with cocaine-induced psychotic disorder with hallucinations: Secondary | ICD-10-CM | POA: Insufficient documentation

## 2021-03-09 LAB — URINE DRUG SCREEN, QUALITATIVE (ARMC ONLY)
Amphetamines, Ur Screen: NOT DETECTED
Barbiturates, Ur Screen: NOT DETECTED
Benzodiazepine, Ur Scrn: NOT DETECTED
Cannabinoid 50 Ng, Ur ~~LOC~~: POSITIVE — AB
Cocaine Metabolite,Ur ~~LOC~~: POSITIVE — AB
MDMA (Ecstasy)Ur Screen: NOT DETECTED
Methadone Scn, Ur: NOT DETECTED
Opiate, Ur Screen: NOT DETECTED
Phencyclidine (PCP) Ur S: NOT DETECTED
Tricyclic, Ur Screen: NOT DETECTED

## 2021-03-09 LAB — COMPREHENSIVE METABOLIC PANEL
ALT: 107 U/L — ABNORMAL HIGH (ref 0–44)
AST: 42 U/L — ABNORMAL HIGH (ref 15–41)
Albumin: 4.6 g/dL (ref 3.5–5.0)
Alkaline Phosphatase: 79 U/L (ref 38–126)
Anion gap: 8 (ref 5–15)
BUN: 16 mg/dL (ref 6–20)
CO2: 29 mmol/L (ref 22–32)
Calcium: 9.8 mg/dL (ref 8.9–10.3)
Chloride: 97 mmol/L — ABNORMAL LOW (ref 98–111)
Creatinine, Ser: 1.15 mg/dL (ref 0.61–1.24)
GFR, Estimated: >60 mL/min (ref >60)
Glucose, Bld: 116 mg/dL — ABNORMAL HIGH (ref 70–99)
Potassium: 4.1 mmol/L (ref 3.5–5.1)
Sodium: 134 mmol/L — ABNORMAL LOW (ref 135–145)
Total Bilirubin: 1 mg/dL (ref 0.3–1.2)
Total Protein: 7.8 g/dL (ref 6.5–8.1)

## 2021-03-09 LAB — CBC WITH DIFFERENTIAL/PLATELET
Abs Immature Granulocytes: 0.03 10*3/uL (ref 0.00–0.07)
Basophils Absolute: 0 10*3/uL (ref 0.0–0.1)
Basophils Relative: 0 %
Eosinophils Absolute: 0 10*3/uL (ref 0.0–0.5)
Eosinophils Relative: 0 %
HCT: 46 % (ref 39.0–52.0)
Hemoglobin: 15.8 g/dL (ref 13.0–17.0)
Immature Granulocytes: 0 %
Lymphocytes Relative: 24 %
Lymphs Abs: 1.8 10*3/uL (ref 0.7–4.0)
MCH: 30 pg (ref 26.0–34.0)
MCHC: 34.3 g/dL (ref 30.0–36.0)
MCV: 87.3 fL (ref 80.0–100.0)
Monocytes Absolute: 0.4 10*3/uL (ref 0.1–1.0)
Monocytes Relative: 5 %
Neutro Abs: 5.3 10*3/uL (ref 1.7–7.7)
Neutrophils Relative %: 71 %
Platelets: 270 10*3/uL (ref 150–400)
RBC: 5.27 MIL/uL (ref 4.22–5.81)
RDW: 12.8 % (ref 11.5–15.5)
WBC: 7.6 10*3/uL (ref 4.0–10.5)
nRBC: 0 % (ref 0.0–0.2)

## 2021-03-09 LAB — ETHANOL: Alcohol, Ethyl (B): <10 mg/dL (ref <10)

## 2021-03-09 MED ORDER — NICOTINE 21 MG/24HR TD PT24: 21.0000 mg | MEDICATED_PATCH | Freq: Every day | TRANSDERMAL | Status: DC

## 2021-03-09 MED ORDER — LORAZEPAM 1 MG PO TABS
1.0000 mg | ORAL_TABLET | Freq: Once | ORAL | Status: AC
Start: 2021-03-09 — End: 2021-03-09
  Administered 2021-03-09: 1 mg via ORAL
  Filled 2021-03-09: qty 1

## 2021-03-09 MED ORDER — LORAZEPAM 1 MG PO TABS: 1.0000 mg | ORAL_TABLET | Freq: Once | ORAL | Status: DC
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
## 2021-03-09 NOTE — ED Notes (Signed)
EDP made aware of pt's BP (165/ 121).

## 2021-03-09 NOTE — ED Notes (Signed)
Pt asking to restart his Zyprexa.  Pt states, "It may help me feel better."   RN will have pharmacy complete med rec.

## 2021-03-09 NOTE — ED Notes (Signed)
VOL  PENDING  PLACEMENT 

## 2021-03-09 NOTE — ED Notes (Signed)
Meal tray and drink given °

## 2021-03-09 NOTE — ED Provider Notes (Signed)
Patient states has received a phone call but he has a bed available at a detox facility.  He said he is calling a friend for a ride.  He is here voluntary.  [facility] for hospitalization.  Appropriate for outpatient follow-up   Merlyn Lot, MD 03/09/21 1819
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
## 2021-03-09 NOTE — BH Assessment (Addendum)
Referral information for detox/substance abuse treatment faxed to:  [facility] [phone]- [phone]),   [facility] [phone])  [facility] [phone])  [facility] [phone])  [facility] [phone])    [facility] [phone])- Spoke with representative Lourdes Sledge who reported that pt was denied due to being recently discharged from rehab. Dois Davenport advised that most facilities won't take pt's until the lapse of 30 days to prevent abuse of funds.  Mannie Stabile [phone])
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
## 2021-03-09 NOTE — ED Notes (Signed)
Pt told RN he had found a bed at a detox facility.  EDP made aware. Pt will be discharged.  Pt aware the hospital will not provide him transportation.

## 2021-03-09 NOTE — ED Notes (Signed)
Psych team at bedside .

## 2021-03-09 NOTE — BH Assessment (Signed)
Writer faxed patient referral to [facility] to be reviewed.Fax# [phone].
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
## 2021-03-09 NOTE — ED Notes (Signed)
Pt stated he has found a ride.

## 2021-03-09 NOTE — TOC Progression Note (Signed)
Transition of Care [facility]) - Progression Note    Patient Details  Name: Bradley Casey MRN: [id number] Date of Birth: 1991/01/09  Transition of Care [facility]) CM/SW Contact  Allayne Butcher, RN Phone Number: 03/09/2021, 3:15 PM  Clinical Narrative:    Behavioral health counselor working with patient.  No TOC need identified at this time.  Please re-consult if needed.          Expected Discharge Plan and Services                                                 Social Determinants of Health (SDOH) Interventions    Readmission Risk Interventions No flowsheet data found.

## 2021-03-16 ENCOUNTER — Emergency Department (HOSPITAL_COMMUNITY)
Admission: EM | Admit: 2021-03-16 | Discharge: 2021-03-17 | Disposition: A | Discharge status: Home / Self Care | Payer: Self-pay | Attending: Student | Admitting: Student

## 2021-03-16 ENCOUNTER — Telehealth: Payer: Self-pay | Admitting: Pharmacy Technician

## 2021-03-16 ENCOUNTER — Other Ambulatory Visit: Payer: Self-pay

## 2021-03-16 ENCOUNTER — Encounter (HOSPITAL_COMMUNITY): Payer: Self-pay

## 2021-03-16 DIAGNOSIS — F1914 Other psychoactive substance abuse with psychoactive substance-induced mood disorder: Secondary | ICD-10-CM | POA: Insufficient documentation

## 2021-03-16 DIAGNOSIS — Z79899 Other long term (current) drug therapy: Secondary | ICD-10-CM | POA: Insufficient documentation

## 2021-03-16 DIAGNOSIS — Y900 Blood alcohol level of less than 20 mg/100 ml: Secondary | ICD-10-CM | POA: Insufficient documentation

## 2021-03-16 DIAGNOSIS — R440 Auditory hallucinations: Secondary | ICD-10-CM | POA: Insufficient documentation

## 2021-03-16 DIAGNOSIS — Z760 Encounter for issue of repeat prescription: Secondary | ICD-10-CM | POA: Insufficient documentation

## 2021-03-16 DIAGNOSIS — F600 Paranoid personality disorder: Secondary | ICD-10-CM | POA: Insufficient documentation

## 2021-03-16 DIAGNOSIS — R4789 Other speech disturbances: Secondary | ICD-10-CM | POA: Insufficient documentation

## 2021-03-16 DIAGNOSIS — Z20822 Contact with and (suspected) exposure to covid-19: Secondary | ICD-10-CM | POA: Insufficient documentation

## 2021-03-16 DIAGNOSIS — F1910 Other psychoactive substance abuse, uncomplicated: Secondary | ICD-10-CM

## 2021-03-16 DIAGNOSIS — Z046 Encounter for general psychiatric examination, requested by authority: Secondary | ICD-10-CM | POA: Insufficient documentation

## 2021-03-16 HISTORY — DX: Schizophrenia, unspecified: F20.9

## 2021-03-16 LAB — ETHANOL: Alcohol, Ethyl (B): <10 mg/dL (ref <10)

## 2021-03-16 LAB — CBC WITH DIFFERENTIAL/PLATELET
Abs Immature Granulocytes: 0.01 10*3/uL (ref 0.00–0.07)
Basophils Absolute: 0 10*3/uL (ref 0.0–0.1)
Basophils Relative: 1 %
Eosinophils Absolute: 0.1 10*3/uL (ref 0.0–0.5)
Eosinophils Relative: 2 %
HCT: 49.6 % (ref 39.0–52.0)
Hemoglobin: 16.2 g/dL (ref 13.0–17.0)
Immature Granulocytes: 0 %
Lymphocytes Relative: 42 %
Lymphs Abs: 1.9 10*3/uL (ref 0.7–4.0)
MCH: 30.1 pg (ref 26.0–34.0)
MCHC: 32.7 g/dL (ref 30.0–36.0)
MCV: 92 fL (ref 80.0–100.0)
Monocytes Absolute: 0.5 10*3/uL (ref 0.1–1.0)
Monocytes Relative: 11 %
Neutro Abs: 2 10*3/uL (ref 1.7–7.7)
Neutrophils Relative %: 44 %
Platelets: 224 10*3/uL (ref 150–400)
RBC: 5.39 MIL/uL (ref 4.22–5.81)
RDW: 13 % (ref 11.5–15.5)
WBC: 4.6 10*3/uL (ref 4.0–10.5)
nRBC: 0 % (ref 0.0–0.2)

## 2021-03-16 LAB — COMPREHENSIVE METABOLIC PANEL
ALT: 30 U/L (ref 0–44)
AST: 27 U/L (ref 15–41)
Albumin: 4.5 g/dL (ref 3.5–5.0)
Alkaline Phosphatase: 69 U/L (ref 38–126)
Anion gap: 7 (ref 5–15)
BUN: 14 mg/dL (ref 6–20)
CO2: 30 mmol/L (ref 22–32)
Calcium: 9.5 mg/dL (ref 8.9–10.3)
Chloride: 101 mmol/L (ref 98–111)
Creatinine, Ser: 1.18 mg/dL (ref 0.61–1.24)
GFR, Estimated: >60 mL/min (ref >60)
Glucose, Bld: 92 mg/dL (ref 70–99)
Potassium: 4.2 mmol/L (ref 3.5–5.1)
Sodium: 138 mmol/L (ref 135–145)
Total Bilirubin: 0.7 mg/dL (ref 0.3–1.2)
Total Protein: 7.5 g/dL (ref 6.5–8.1)

## 2021-03-16 LAB — RAPID URINE DRUG SCREEN, HOSP PERFORMED
Amphetamines: NOT DETECTED
Barbiturates: NOT DETECTED
Benzodiazepines: NOT DETECTED
Cocaine: NOT DETECTED
Opiates: NOT DETECTED
Tetrahydrocannabinol: POSITIVE — AB

## 2021-03-16 LAB — SALICYLATE LEVEL: Salicylate Lvl: <7 mg/dL — ABNORMAL LOW (ref 7.0–30.0)

## 2021-03-16 LAB — ACETAMINOPHEN LEVEL: Acetaminophen (Tylenol), Serum: <10 ug/mL — ABNORMAL LOW (ref 10–30)

## 2021-03-16 LAB — RESP PANEL BY RT-PCR (FLU A&B, COVID) ARPGX2
Influenza A by PCR: NEGATIVE
Influenza B by PCR: NEGATIVE
SARS Coronavirus 2 by RT PCR: NEGATIVE

## 2021-03-16 MED ORDER — FLUOXETINE HCL 20 MG PO CAPS
20.0000 mg | ORAL_CAPSULE | Freq: Every day | ORAL | Status: DC
  Administered 2021-03-17: 10:00:00 20 mg via ORAL
  Filled 2021-03-16: qty 1

## 2021-03-16 MED ORDER — OLANZAPINE 5 MG PO TABS
15.0000 mg | ORAL_TABLET | Freq: Every day | ORAL | Status: DC
  Administered 2021-03-16: 23:00:00 15 mg via ORAL
  Filled 2021-03-16: qty 1

## 2021-03-16 MED ORDER — BENZTROPINE MESYLATE 0.5 MG PO TABS
0.5000 mg | ORAL_TABLET | Freq: Every day | ORAL | Status: DC
  Administered 2021-03-17: 10:00:00 0.5 mg via ORAL
  Filled 2021-03-16: qty 1
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
## 2021-03-16 NOTE — ED Triage Notes (Signed)
Patient states "I want to get my medicine." Benztropine, fluoxetine. Olanzapine.  Patient states, "things have been getting different today." Patient states, People are acting funny and feels like people are out to get him."    Patient denies any SI/HI. Patient states he is hearing voices that are whispering. Patient states, "If I do what I'm suppose to do, people are going to get hurt.  Patient denies any alcohol or drug use.

## 2021-03-16 NOTE — BH Assessment (Addendum)
@  1740, called the TTS machine, no answer. @1745 , called the TTS machine, no answer. Notified the nurse , RN) of the attempt(s) to cal the TTS machine.

## 2021-03-16 NOTE — BH Assessment (Addendum)
@  1745, requested the ED providers to assist in moving the TTS machine to patient's room [name], MD and [place], [place]).

## 2021-03-16 NOTE — Telephone Encounter (Signed)
Patient failed to provide 2023 proof of income.  No additional medication assistance will be provided by [facility] without the required proof of income documentation.  Patient notified by letter.  Bradley Casey Care Manager [facility]    [address] [place], [place]  [address]  March 15, 2021    Bradley Casey [address] [place], [place]  [address]  Dear Bradley Casey:  This is to inform you that you are no longer eligible to receive medication assistance at [facility].  The reason(s) are:    _____Your total gross monthly household income exceeds 300% of the Federal Poverty Level.   _____Tangible assets (savings, checking, stocks/bonds, pension, retirement, etc.) exceeds our limit  _____You are eligible to receive benefits from [facility], [facility] or HIV Medication              Assistance Program _____You are eligible to receive benefits from a Medicare Part D plan _____You have prescription insurance  _____You are not an [facility] resident __X__Failure to provide all requested documentation (proof of income for 2023, and/or Patient Intake Application, DOH Attestation, Contract, etc).    Medication assistance will resume once all requested documentation has been returned to our clinic.  If you have questions, please contact our clinic at [phone].    Thank you,  [facility]

## 2021-03-16 NOTE — ED Notes (Signed)
Unable to change pt out into burgundy scrubs due to not having any available.

## 2021-03-16 NOTE — ED Provider Notes (Signed)
[facility]-EMERGENCY DEPT Provider Note   CSN: [id number] Arrival date & time: 03/16/21  1602     History  Chief Complaint  Patient presents with   Medication Refill    Bradley Casey is a 31 y.o. male presenting for psychiatric evaluation and medication refill.  Patient states he has been out of his Zyprexa.  He has not been able to get to the behavioral urgent care.  He was going to get his medications refilled by [facility] today, however felt like like he was having increased auditory hallucinations, is having whispering and tells me go certain past.  He is worried if he follows this path he will hurt somebody.  However he denies overt SI or HI.  No visual hallucinations.  HPI     Home Medications Prior to Admission medications   Medication Sig Start Date End Date Taking? Authorizing Provider  benztropine (COGENTIN) 0.5 MG tablet Take 1 tablet (0.5 mg total) by mouth daily. 02/03/21   Clapacs, [name], MD  FLUoxetine (PROZAC) 20 MG capsule Take 1 capsule (20 mg total) by mouth daily. 02/03/21   Clapacs, [name], MD  OLANZapine (ZYPREXA) 15 MG tablet Take 1 tablet (15 mg total) by mouth at bedtime. 02/03/21   Clapacs, [name], MD      Allergies    Patient has no known allergies.    Review of Systems   Review of Systems  Psychiatric/Behavioral:  Positive for hallucinations.    Physical Exam Updated Vital Signs BP (!) 144/82 (BP Location: Left Arm)    Pulse 86    Temp 98 F (36.7 C) (Oral)    Resp 20    Ht 6' (1.829 m)    Wt 79.4 kg    SpO2 100%    BMI 23.73 kg/m  Physical Exam Vitals and nursing note reviewed.  Constitutional:      General: He is not in acute distress.    Appearance: He is well-developed.     Comments: In the chair in NAD  HENT:     Head: Normocephalic and atraumatic.  Eyes:     Extraocular Movements: Extraocular movements intact.  Cardiovascular:     Rate and Rhythm: Normal rate and regular rhythm.  Pulmonary:     Effort:  Pulmonary effort is normal.  Abdominal:     General: There is no distension.     Palpations: Abdomen is soft.  Musculoskeletal:        General: Normal range of motion.     Cervical back: Normal range of motion.  Skin:    General: Skin is warm.     Findings: No rash.  Neurological:     Mental Status: He is alert and oriented to person, place, and time.  Psychiatric:        Attention and Perception: He perceives auditory hallucinations.        Mood and Affect: Mood is anxious. Affect is flat.        Speech: Speech is tangential.        Thought Content: Thought content is paranoid.     Comments: Patient appears paranoid.  As such, he is anxious.  Very flat affect.  Tangential thoughts, difficulty completing a full fault or phrase.  Reports auditory hallucinations.  He is concerned he may hurt somebody but is not homicidal at this time    ED Results / Procedures / Treatments   Labs (all labs ordered are listed, but only abnormal results are displayed) Labs Reviewed  RESP PANEL BY RT-PCR (FLU A&B, COVID) [id number]  COMPREHENSIVE METABOLIC PANEL  ETHANOL  RAPID URINE DRUG SCREEN, HOSP PERFORMED  CBC WITH DIFFERENTIAL/PLATELET  ACETAMINOPHEN LEVEL  SALICYLATE LEVEL    EKG None  Radiology No results found.  Procedures Procedures    Medications Ordered in ED Medications - No data to display  ED Course/ Medical Decision Making/ A&P                           Medical Decision Making Amount and/or Complexity of Data Reviewed Labs: ordered.    This patient presents to the ED for concern of psych condition and being out of medications. This involves a number of treatment options, and is a complaint that carries with it a moderate risk of complications and morbidity.  The differential diagnosis includes med refill, paranoia, si/hi.   Additional history: H/o schizophrenia, substance induced mood disorder.   Lab Tests:  I ordered, and personally interpreted labs.  The  pertinent results include:  normal labs,. Uds positive only for thc. Medically cleared for tts eval.    Consults:  I requested consultation with the behavioral health team. They recommend overnight obvs and reassessment in AM. Will restart pt's meds.   Disposition:  Pt to board overnight.    The patient has been placed in psychiatric observation due to the need to provide a safe environment for the patient while obtaining psychiatric consultation and evaluation, as well as ongoing medical and medication management to treat the patient's condition.  The patient has not been placed under full IVC at this time.   Final Clinical Impression(s) / ED Diagnoses Final diagnoses:  None    Rx / DC Orders ED Discharge Orders     None         Alveria Apley, PA-C 03/16/21 2242    Gl[place]ora Score, MD 03/17/21 0004

## 2021-03-16 NOTE — BH Assessment (Signed)
Clinician attempted to make contact with pt's team at 2006 in an effort to complete pt's MH Assessment. At 2035 clinician had not received a response so replied that TTS would attempt again at a later time.

## 2021-03-16 NOTE — BH Assessment (Signed)
@  1737, requested patient's nurse Freda Munro, RN) to place the TTS machine in patient's room.

## 2021-03-17 MED ORDER — BENZTROPINE MESYLATE 0.5 MG PO TABS: 0.5000 mg | ORAL_TABLET | Freq: Every day | ORAL | 1 refills | Status: DC

## 2021-03-17 MED ORDER — OLANZAPINE 15 MG PO TABS: 15.0000 mg | ORAL_TABLET | Freq: Every day | ORAL | 1 refills | Status: DC

## 2021-03-17 MED ORDER — FLUOXETINE HCL 20 MG PO CAPS: 20.0000 mg | ORAL_CAPSULE | Freq: Every day | ORAL | 1 refills | Status: DC

## 2021-03-17 NOTE — ED Provider Notes (Signed)
Emergency Medicine Observation Re-evaluation Note  Bradley Casey is a 31 y.o. male, seen on rounds today.  Pt initially presented to the ED for complaints of Medication Refill, Addiction Problem, Anxiety, and Delusional Currently, the patient is sleeping.  Physical Exam  BP 118/71 (BP Location: Left Arm)    Pulse 77    Temp 97.9 F (36.6 C) (Oral)    Resp 16    Ht 6' (1.829 m)    Wt 79.4 kg    SpO2 96%    BMI 23.73 kg/m  Physical Exam General: Calm, nondistressed Cardiac: Extremities well perfused Lungs: Unlabored breathing Psych: Deferred  ED Course / MDM  EKG:EKG Interpretation  Date/Time:  Tuesday March 16 2021 19:16:59 EST Ventricular Rate:  78 PR Interval:  175 QRS Duration: 88 QT Interval:  368 QTC Calculation: 420 R Axis:   106 Text Interpretation: Sinus rhythm Borderline right axis deviation When compared with ECG of 03/09/2021, No significant change was found Confirmed by Dione Booze ([id number]) on 03/17/2021 7:38:13 AM  I have reviewed the labs performed to date as well as medications administered while in observation.  Recent changes in the last 24 hours include patient presented to the ED yesterday due to being out of his Zyprexa and having worsening auditory hallucinations.  He endorsed concern that this may cause him to hurt somebody.  He has been evaluated by TTS who plans on reassessment this morning.  Zyprexa has been restarted.  Plan  Current plan is for reassessment this morning.  Bradley Casey is not under involuntary commitment.  Following psychiatry assessment, patient was cleared from a psychiatry standpoint.  He was instructed to follow-up with [facility] in [place].  He was discharged in stable condition.     Gloris Manchester, MD 03/17/21 [phone]

## 2021-03-17 NOTE — BH Assessment (Signed)
[facility] Assessment Progress Note   Per Caryn Bee, NP , this voluntary pt does not require psychiatric hospitalization at this time.  Pt is psychiatrically cleared.  Discharge instructions advise pt to follow up with [facility] in [place].  EDP Gloris Manchester, MD and pt's nurse, Deon Pilling, have been notified.  Doylene Canning, MA Triage Specialist [phone]

## 2021-03-17 NOTE — Discharge Instructions (Addendum)
Please continue to take medications as directed. If your symptoms return, worsen, or persist please call your 911, report to local ER, or contact crisis hotline. Please do not drink alcohol or use any illegal substances while taking prescription medications.  For your behavioral health needs you are advised to follow up with [facility] at your earliest opportunity:      [facility]      [address]      [place], [place] [address]      [phone]       New clients are seen during walk-in hours, Monday, Wednesday and Friday from 8:00 am - 2:00 pm.  Walk-ins are seen first come, first served, so try to arrive as early as possible for the best chance of being seen the same day.

## 2021-03-19 ENCOUNTER — Other Ambulatory Visit: Payer: Self-pay

## 2021-03-31 ENCOUNTER — Other Ambulatory Visit: Payer: Self-pay

## 2021-04-07 ENCOUNTER — Ambulatory Visit (HOSPITAL_COMMUNITY)
Admission: EM | Admit: 2021-04-07 | Discharge: 2021-04-08 | Disposition: A | Discharge status: Home / Self Care | Payer: Federal, State, Local not specified - Other | Attending: Psychiatry | Admitting: Psychiatry

## 2021-04-07 ENCOUNTER — Other Ambulatory Visit: Payer: Self-pay

## 2021-04-07 ENCOUNTER — Ambulatory Visit (HOSPITAL_COMMUNITY)
Admission: AD | Admit: 2021-04-07 | Discharge: 2021-04-07 | Disposition: A | Discharge status: Home / Self Care | Payer: Federal, State, Local not specified - Other | Attending: Psychiatry | Admitting: Psychiatry

## 2021-04-07 DIAGNOSIS — F1910 Other psychoactive substance abuse, uncomplicated: Secondary | ICD-10-CM | POA: Insufficient documentation

## 2021-04-07 DIAGNOSIS — F1091 Alcohol use, unspecified, in remission: Secondary | ICD-10-CM | POA: Insufficient documentation

## 2021-04-07 DIAGNOSIS — F1410 Cocaine abuse, uncomplicated: Secondary | ICD-10-CM | POA: Insufficient documentation

## 2021-04-07 DIAGNOSIS — G4700 Insomnia, unspecified: Secondary | ICD-10-CM | POA: Insufficient documentation

## 2021-04-07 DIAGNOSIS — R45850 Homicidal ideations: Secondary | ICD-10-CM | POA: Insufficient documentation

## 2021-04-07 DIAGNOSIS — F29 Unspecified psychosis not due to a substance or known physiological condition: Secondary | ICD-10-CM | POA: Insufficient documentation

## 2021-04-07 DIAGNOSIS — F22 Delusional disorders: Secondary | ICD-10-CM | POA: Insufficient documentation

## 2021-04-07 DIAGNOSIS — F1291 Cannabis use, unspecified, in remission: Secondary | ICD-10-CM | POA: Insufficient documentation

## 2021-04-07 DIAGNOSIS — F32A Depression, unspecified: Secondary | ICD-10-CM | POA: Insufficient documentation

## 2021-04-07 DIAGNOSIS — R450 Nervousness: Secondary | ICD-10-CM | POA: Insufficient documentation

## 2021-04-07 DIAGNOSIS — Z79899 Other long term (current) drug therapy: Secondary | ICD-10-CM | POA: Insufficient documentation

## 2021-04-07 DIAGNOSIS — Z20822 Contact with and (suspected) exposure to covid-19: Secondary | ICD-10-CM | POA: Insufficient documentation

## 2021-04-07 DIAGNOSIS — F14151 Cocaine abuse with cocaine-induced psychotic disorder with hallucinations: Secondary | ICD-10-CM | POA: Insufficient documentation

## 2021-04-07 DIAGNOSIS — F2081 Schizophreniform disorder: Secondary | ICD-10-CM | POA: Insufficient documentation

## 2021-04-07 DIAGNOSIS — F209 Schizophrenia, unspecified: Secondary | ICD-10-CM | POA: Insufficient documentation

## 2021-04-07 DIAGNOSIS — R45851 Suicidal ideations: Secondary | ICD-10-CM | POA: Insufficient documentation

## 2021-04-07 LAB — LIPID PANEL
Cholesterol: 190 mg/dL (ref 0–200)
HDL: 46 mg/dL (ref >40)
LDL Cholesterol: 108 mg/dL — ABNORMAL HIGH (ref 0–99)
Total CHOL/HDL Ratio: 4.1 RATIO
Triglycerides: 181 mg/dL — ABNORMAL HIGH (ref <150)
VLDL: 36 mg/dL (ref 0–40)

## 2021-04-07 LAB — CBC WITH DIFFERENTIAL/PLATELET
Abs Immature Granulocytes: 0.02 10*3/uL (ref 0.00–0.07)
Basophils Absolute: 0 10*3/uL (ref 0.0–0.1)
Basophils Relative: 0 %
Eosinophils Absolute: 0.1 10*3/uL (ref 0.0–0.5)
Eosinophils Relative: 1 %
HCT: 44.8 % (ref 39.0–52.0)
Hemoglobin: 15.1 g/dL (ref 13.0–17.0)
Immature Granulocytes: 0 %
Lymphocytes Relative: 48 %
Lymphs Abs: 2.3 10*3/uL (ref 0.7–4.0)
MCH: 29.8 pg (ref 26.0–34.0)
MCHC: 33.7 g/dL (ref 30.0–36.0)
MCV: 88.5 fL (ref 80.0–100.0)
Monocytes Absolute: 0.5 10*3/uL (ref 0.1–1.0)
Monocytes Relative: 10 %
Neutro Abs: 2 10*3/uL (ref 1.7–7.7)
Neutrophils Relative %: 41 %
Platelets: 228 10*3/uL (ref 150–400)
RBC: 5.06 MIL/uL (ref 4.22–5.81)
RDW: 12.5 % (ref 11.5–15.5)
WBC: 5 10*3/uL (ref 4.0–10.5)
nRBC: 0 % (ref 0.0–0.2)

## 2021-04-07 LAB — POCT URINE DRUG SCREEN - MANUAL ENTRY (I-SCREEN)
POC Amphetamine UR: NOT DETECTED
POC Buprenorphine (BUP): POSITIVE — AB
POC Cocaine UR: POSITIVE — AB
POC Marijuana UR: NOT DETECTED
POC Methadone UR: NOT DETECTED
POC Methamphetamine UR: NOT DETECTED
POC Morphine: NOT DETECTED
POC Oxazepam (BZO): NOT DETECTED
POC Oxycodone UR: NOT DETECTED
POC Secobarbital (BAR): NOT DETECTED

## 2021-04-07 LAB — TSH: TSH: 2.036 u[IU]/mL (ref 0.350–4.500)

## 2021-04-07 LAB — COMPREHENSIVE METABOLIC PANEL
ALT: 25 U/L (ref 0–44)
AST: 25 U/L (ref 15–41)
Albumin: 4.1 g/dL (ref 3.5–5.0)
Alkaline Phosphatase: 79 U/L (ref 38–126)
Anion gap: 9 (ref 5–15)
BUN: 8 mg/dL (ref 6–20)
CO2: 30 mmol/L (ref 22–32)
Calcium: 9.7 mg/dL (ref 8.9–10.3)
Chloride: 98 mmol/L (ref 98–111)
Creatinine, Ser: 1.32 mg/dL — ABNORMAL HIGH (ref 0.61–1.24)
GFR, Estimated: >60 mL/min (ref >60)
Glucose, Bld: 103 mg/dL — ABNORMAL HIGH (ref 70–99)
Potassium: 4.2 mmol/L (ref 3.5–5.1)
Sodium: 137 mmol/L (ref 135–145)
Total Bilirubin: 0.3 mg/dL (ref 0.3–1.2)
Total Protein: 6.8 g/dL (ref 6.5–8.1)

## 2021-04-07 LAB — HEMOGLOBIN A1C
Hgb A1c MFr Bld: 5.8 % — ABNORMAL HIGH (ref 4.8–5.6)
Mean Plasma Glucose: 119.76 mg/dL

## 2021-04-07 LAB — POC SARS CORONAVIRUS 2 AG -  ED: SARS Coronavirus 2 Ag: NEGATIVE

## 2021-04-07 LAB — POC SARS CORONAVIRUS 2 AG: SARSCOV2ONAVIRUS 2 AG: NEGATIVE

## 2021-04-07 LAB — ETHANOL: Alcohol, Ethyl (B): <10 mg/dL (ref <10)

## 2021-04-07 LAB — RESP PANEL BY RT-PCR (FLU A&B, COVID) ARPGX2
Influenza A by PCR: NEGATIVE
Influenza B by PCR: NEGATIVE
SARS Coronavirus 2 by RT PCR: NEGATIVE

## 2021-04-07 MED ORDER — ALUM & MAG HYDROXIDE-SIMETH 200-200-20 MG/5ML PO SUSP: 30.0000 mL | ORAL | Status: DC | PRN

## 2021-04-07 MED ORDER — OLANZAPINE 7.5 MG PO TABS
15.0000 mg | ORAL_TABLET | Freq: Every day | ORAL | Status: DC
  Administered 2021-04-07: 15 mg via ORAL
  Filled 2021-04-07: qty 2

## 2021-04-07 MED ORDER — HYDROXYZINE HCL 25 MG PO TABS
25.0000 mg | ORAL_TABLET | Freq: Three times a day (TID) | ORAL | Status: DC | PRN
  Administered 2021-04-07 (×2): 25 mg via ORAL
  Filled 2021-04-07 (×3): qty 1

## 2021-04-07 MED ORDER — BENZTROPINE MESYLATE 0.5 MG PO TABS
0.5000 mg | ORAL_TABLET | Freq: Once | ORAL | Status: AC
  Administered 2021-04-07: 0.5 mg via ORAL
  Filled 2021-04-07: qty 1

## 2021-04-07 MED ORDER — FLUOXETINE HCL 20 MG PO CAPS
20.0000 mg | ORAL_CAPSULE | Freq: Once | ORAL | Status: AC
  Administered 2021-04-07: 20 mg via ORAL
  Filled 2021-04-07: qty 1

## 2021-04-07 MED ORDER — FLUOXETINE HCL 20 MG PO CAPS
20.0000 mg | ORAL_CAPSULE | Freq: Every day | ORAL | Status: DC
  Administered 2021-04-08: 20 mg via ORAL
  Filled 2021-04-07: qty 1

## 2021-04-07 MED ORDER — ACETAMINOPHEN 325 MG PO TABS: 650.0000 mg | ORAL_TABLET | Freq: Four times a day (QID) | ORAL | Status: DC | PRN

## 2021-04-07 MED ORDER — OLANZAPINE 7.5 MG PO TABS
15.0000 mg | ORAL_TABLET | Freq: Once | ORAL | Status: AC
  Administered 2021-04-07: 15 mg via ORAL
  Filled 2021-04-07: qty 2

## 2021-04-07 MED ORDER — BENZTROPINE MESYLATE 0.5 MG PO TABS
0.5000 mg | ORAL_TABLET | Freq: Every day | ORAL | Status: DC
  Administered 2021-04-08: 0.5 mg via ORAL
  Filled 2021-04-07: qty 1

## 2021-04-07 MED ORDER — MAGNESIUM HYDROXIDE 400 MG/5ML PO SUSP: 30.0000 mL | Freq: Every day | ORAL | Status: DC | PRN
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
## 2021-04-07 NOTE — ED Notes (Signed)
Pt sleeping at present, no distress noted, respirations even & unlabored.  Monitoring for safety. ?

## 2021-04-07 NOTE — ED Provider Notes (Signed)
Behavioral Health Admission H&P [facility] OBS)  Date: 04/07/21 Patient Name: Bradley Casey MRN: [id number] Chief Complaint:  Chief Complaint  Patient presents with   Medication Problem   Hallucinations   Homicidal      Diagnoses:  Final diagnoses:  Schizophreniform disorder ([place])  Cocaine use disorder ([place])    HPI: Bradley Casey is a 31 y.o. male schizophreniform disorder, psychosis, cocaine abuse, cocaine-induced psychotic disorder with hallucinations, polysubstance abuse who presented to [facility] as a walk-in. He was transferred to [facility] for continuous assessment.   On evaluation patient is alert and oriented x 4, pleasant, and cooperative. Speech is clear and coherent. Mood is anxious and affect is congruent with mood. Thought process is coherent and thought content is logical. Denies current auditory and visual hallucinations. No indication that patient is responding to internal stimuli. Reports feelings of paranoia. Endorses passive SI without intent/plan. Endorses homicidal ideations towards no one specific and without a plan.  [facility] Provider assessment: Bradley Casey is a 31 y.o. male with documented past psychiatric history significant for schizophreniform disorder, psychosis, cocaine abuse, cocaine-induced psychotic disorder with hallucinations, polysubstance abuse, and no significant past medical history, who presents to the [facility]) unaccompanied as a voluntary walk-in for HI and auditory hallucinations.  Patient reports that he is currently living at [facility] in [place] and that one of the [facility] employees dropped him off at [facility].  Patient states that he asked this sober [facility] employee to drive him to [facility] because "I was having some bad thoughts and some things were overwhelming".  When patient is asked to provide further details regarding these "bad thoughts", patient states "more like homicidal  thoughts. I'm not saying I'm suicidal, but I guess you can mix a little bit of that in there too".  Patient endorses passive SI currently on exam without plan or intent.  Patient denies history of any previous suicide attempts.  He denies history of self-injurious behavior via intentionally cutting or burning himself.  Patient endorses active HI with intent, but without plan.  Patient initially reports that his HI is not directed towards any specific individual.  When patient is asked to provide further details regarding his HI, he reports that some of his roommates at [facility] [place] will occasionally "move funny, act funny", which he states will make him irritated.  When patient is asked if his HI is directed towards his roommates at [facility], patient states that sometimes he has thoughts that he should hurt them before they hurt him and also states "when certain things come together, the only thing to get me out of it would be to hurt somebody", but he does not provide further details regarding his HI.  He denies being a part of any verbal or physical altercations between him and his [facility] roommates.  Patient denies AVH currently on exam.  He reports having intermittent auditory hallucinations (occasionally command auditory hallucinations as well), but the patient is unable to recall how often he experiences them.  He describes his auditory hallucinations as occasionally hearing a single male voice whispering, that is different from the patient's own voice.  Patient also states that this voice will occasionally tell him to hurt other people before they hurt him, but patient does not provide further details regarding this statement.  Patient also states that occasionally while he is in the kitchen at his current living situation at  [facility] [place], he will hear this voice tell him to hurt his roommates with a knife before they hurt him. Patient does report that he  will occasionally hear his own voice in his head, but he states that this is his own internal dialogue and that this is not auditory hallucinations.  He reports that he last experienced these auditory hallucinations earlier today on 04/06/2021.  He denies visual hallucinations.  Patient also endorses intermittent episodes of paranoia, but patient does not provide details regarding if his paranoia is directed towards a particular person.  Patient describes his sleep as good, about 8 hours per night.  He reports that his appetite has been increased over the past few months and he reports that he has gained about 15 pounds over the past few months.  Per chart review, patient was psychiatrically hospitalized at [facility] from 01/29/2021 to 02/03/2021 and was discharged on Zyprexa 15 mg p.o. at bedtime, Cogentin 0.5 mg p.o. daily, and Prozac 20 mg p.o. daily.  Chart review also shows that after patient's psychiatric hospitalization discharged on 02/03/2021, patient presented to [facility] emergency department on 02/17/2021 for psychiatric evaluation, specifically paranoia, command auditory hallucinations, and thoughts of harming other people.  Per chart review, patient was followed by psychiatry in the emergency department was ultimately psychiatrically cleared on 02/19/2021 and discharged from the ED on 02/19/2021 with plan to follow up at [place].  Patient then presented back to [facility] ED on 03/09/2021 for detox from cocaine, was evaluated by psychiatry and was ultimately psychiatrically cleared and discharged from the ED on 03/09/2021 with plan to go to a detox facility.  [facility] then presented to [facility] on 03/16/2021 for psychiatric evaluation and med refill.  Patient was evaluated by TTS on 03/16/2021, was observed in the emergency department overnight, and was ultimately psychiatrically cleared on 03/17/2028 and discharged from the ED on 03/17/2021 with prescriptions for Zyprexa 15 mg p.o. at bedtime, Cogentin 0.5 mg  p.o. daily, and Prozac 20 mg p.o. daily, and with instructions to follow up with [facility].  Chart review also shows that patient was also psychiatrically hospitalized at [facility] from 01/21/2019 to 01/25/2019 as well.  Patient denies history of any additional inpatient psychiatric hospitalizations   Patient reports that he does not have an outpatient psychiatric provider or therapist at this time.  Patient reports that his current psychotropic medication regimen consists of Zyprexa 15 mg p.o. daily, Cogentin 0.5 mg p.o. daily, and Prozac 20 mg p.o. daily.  Patient reports that he takes these 3 prescriptions as prescribed and states that he typically takes all 3 of these medications in the morning before he goes to sleep due to him working third shift.  Patient's home prescription medication bottles were retrieved from patient's locked belongings and were personally reviewed by this provider with the patient.  Upon review of the patient's home medication prescription bottles with the patient, it was confirmed that patient has prescriptions for Zyprexa 15 mg p.o. daily that was filled on 03/19/2021, Cogentin 0.5 mg p.o. daily that was filled on 03/19/2021, and Prozac 20 mg p.o. daily that was filled on 03/19/2021.  After review of patient's prescriptions with the patient, patient's prescription bottles were returned to patient's locked belongings and locked up with the rest of patient's belongings without issue. Patient reports that these medications are prescribed by his provider at [facility]).  Patient reports that he has not taken his medications since the morning of Monday,  04/05/2021.  Patient denies taking any additional home medications at this time.   Patient reports that he relapsed on cocaine (about 0.5 g worth) a few days ago and denies any cocaine use since this relapse.  He reports that prior to this relapse a few days ago, he had been completely sober from cocaine since December  2022 and he states that prior to this period of sobriety, he had been using 0.5 to 1 g of cocaine via intranasal route.  Patient reports that he has not consumed any alcohol since December 2022.  Patient reports that prior to December 2022, he was drinking about 2 25 ounce cans of beer 3-4 times per week.  He denies history of alcohol-related withdrawal symptoms or seizures.  Patient also reports that he has not used any marijuana since December 2022.  He reports that prior to December 2022, he was using marijuana a few times per week.  Patient denies current or recent use of any other substances.   Patient reports that he is currently living at [facility] and states that he has been living there for the past month.  Patient reports that sober living is willing to accept him back at their facility upon future discharge.  He reports that prior to him living at [facility], he was homeless and intermittently living with "friends and females".  Patient reports that he was incarcerated about 2 years ago for a fraud charge and a charge for possession of cocaine.  Patient denies any current pending legal charges.  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  Patient reports that he is not currently in school at this time.  He reports that his highest level of education is partial college completion.   Patient is unable to contract for safety at this time.  PHQ 2-9:  [facility] ED from 03/16/2021 in [place] DEPT  Thoughts that you would be better off dead, or of hurting yourself in some way Not at all  PHQ-9 Total Score 6       [place] ED from 04/07/2021 in [facility] ED from 03/16/2021 in [place] DEPT ED from 03/09/2021 in [place] No Risk No Risk No Risk        Total Time spent with patient: 20 minutes  Musculoskeletal  Strength & Muscle Tone: within normal limits Gait & Station: normal Patient leans: N/A  Psychiatric Specialty Exam  Presentation General Appearance: Appropriate for Environment; Fairly Groomed  Eye Contact:Good  Speech:Clear and Coherent; Normal Rate  Speech Volume:Normal  Handedness:Right   Mood and Affect  Mood:Anxious  Affect:Congruent   Thought Process  Thought Processes:Coherent; Goal Directed; Linear  Descriptions of Associations:Intact  Orientation:Full (Time, Place and Person)  Thought Content:Paranoid Ideation  Diagnosis of Schizophrenia or Schizoaffective disorder in past: No  Duration of Psychotic Symptoms: Greater than six months  Hallucinations:Hallucinations: None  Ideas of Reference:Paranoia  Suicidal Thoughts:Suicidal Thoughts: Yes, Passive SI Passive Intent and/or Plan: Without Intent; Without Plan  Homicidal Thoughts:Homicidal Thoughts: Yes, Active HI Active Intent and/or Plan: Without Intent; Without Plan   Sensorium  Memory:Immediate Good; Recent Good; Remote Good  Judgment:Fair  Insight:Fair   Executive Functions  Concentration:Good  Attention Span:Good  [place]  Language:Good   Psychomotor Activity  Psychomotor Activity:Psychomotor Activity:  Normal   Assets  Assets:Communication Skills; Desire for Improvement; Physical Health   Sleep  Sleep:Sleep: Good   Nutritional Assessment (For OBS and FBC admissions only) Has the patient had a weight loss or gain of 10 pounds or more in the last 3 months?: No Has the patient had a decrease in food intake/or appetite?: No Does the patient have dental problems?: No Does the patient have eating habits or behaviors that may be indicators of an eating disorder including binging or inducing vomiting?: No Has the patient recently lost weight without  trying?: 0 Has the patient been eating poorly because of a decreased appetite?: 0 Malnutrition Screening Tool Score: 0    Physical Exam Constitutional:      General: He is not in acute distress.    Appearance: He is not ill-appearing, toxic-appearing or diaphoretic.  HENT:     Head: Normocephalic.     Right Ear: External ear normal.     Left Ear: External ear normal.  Eyes:     Pupils: Pupils are equal, round, and reactive to light.  Cardiovascular:     Rate and Rhythm: Normal rate.  Pulmonary:     Effort: Pulmonary effort is normal. No respiratory distress.  Musculoskeletal:        General: Normal range of motion.  Skin:    General: Skin is warm and dry.  Neurological:     Mental Status: He is alert and oriented to person, place, and time.  Psychiatric:        Mood and Affect: Mood is anxious.        Speech: Speech normal.        Behavior: Behavior is cooperative.        Thought Content: Thought content is paranoid. Thought content includes homicidal and suicidal ideation. Thought content does not include suicidal plan.   Review of Systems  Psychiatric/Behavioral:  Positive for depression and suicidal ideas. Negative for hallucinations and memory loss. The patient is nervous/anxious and has insomnia.    Blood pressure 127/87, pulse 80, temperature 98.7 F (37.1 C), temperature source Oral, resp. rate 18, SpO2 96 %. There is no height or weight on file to calculate BMI.  Past Psychiatric History: See above  Is the patient at risk to self? No  Has the patient been a risk to self in the past 6 months? No .    Has the patient been a risk to self within the distant past? No   Is the patient a risk to others? No   Has the patient been a risk to others in the past 6 months? No   Has the patient been a risk to others within the distant past? No   Past Medical History:  Past Medical History:  Diagnosis Date   Schizophrenia ([place])    No past surgical history on  file.  Family History:  Family History  Family history unknown: Yes    Social History:  Social History   Socioeconomic History   Marital status: Single    Spouse name: Not on file   Number of children: Not on file   Years of education: Not on file   Highest education level: Not on file  Occupational History   Not on file  Tobacco Use   Smoking status: Some Days    Types: Cigars   Smokeless tobacco: Never  Vaping Use   Vaping Use: Some days   Substances: Nicotine, Flavoring  Substance and Sexual Activity   Alcohol use:  Not Currently   Drug use: Not Currently    Types: Marijuana, Cocaine   Sexual activity: Yes  Other Topics Concern   Not on file  Social History Narrative   Not on file   Social Determinants of Health   Financial Resource Strain: Not on file  Food Insecurity: Not on file  Transportation Needs: Not on file  Physical Activity: Not on file  Stress: Not on file  Social Connections: Not on file  Intimate Partner Violence: Not on file    SDOH:  SDOH Screenings   Alcohol Screen: Low Risk    Last Alcohol Screening Score (AUDIT): 1  Depression (PHQ2-9): Medium Risk   PHQ-2 Score: 6  Financial Resource Strain: Not on file  Food Insecurity: Not on file  Housing: Not on file  Physical Activity: Not on file  Social Connections: Not on file  Stress: Not on file  Tobacco Use: High Risk   Smoking Tobacco Use: Some Days   Smokeless Tobacco Use: Never   Passive Exposure: Not on file  Transportation Needs: Not on file    Last Labs:  Admission on 04/07/2021  Component Date Value Ref Range Status   SARS Coronavirus 2 by RT PCR 04/07/2021 NEGATIVE  NEGATIVE Final   Comment: (NOTE) SARS-CoV-2 target nucleic acids are NOT DETECTED.  The SARS-CoV-2 RNA is generally detectable in upper respiratory specimens during the acute phase of infection. The lowest concentration of SARS-CoV-2 viral copies this assay can detect is 138 copies/mL. A negative result  does not preclude SARS-Cov-2 infection and should not be used as the sole basis for treatment or other patient management decisions. A negative result may occur with  improper specimen collection/handling, submission of specimen other than nasopharyngeal swab, presence of viral mutation(s) within the areas targeted by this assay, and inadequate number of viral copies(<138 copies/mL). A negative result must be combined with clinical observations, patient history, and epidemiological information. The expected result is Negative.  Fact Sheet for Patients:  EntrepreneurPulse.com.au  Fact Sheet for Healthcare Providers:  IncredibleEmployment.be  This test is no                          t yet approved or cleared by the [place] FDA and  has been authorized for detection and/or diagnosis of SARS-CoV-2 by FDA under an Emergency Use Authorization (EUA). This EUA will remain  in effect (meaning this test can be used) for the duration of the COVID-19 declaration under Section 564(b)(1) of the Act, 21 U.S.C.section 360bbb-3(b)(1), unless the authorization is terminated  or revoked sooner.       Influenza A by PCR 04/07/2021 NEGATIVE  NEGATIVE Final   Influenza B by PCR 04/07/2021 NEGATIVE  NEGATIVE Final   Comment: (NOTE) The Xpert Xpress SARS-CoV-2/FLU/RSV plus assay is intended as an aid in the diagnosis of influenza from Nasopharyngeal swab specimens and should not be used as a sole basis for treatment. Nasal washings and aspirates are unacceptable for Xpert Xpress SARS-CoV-2/FLU/RSV testing.  Fact Sheet for Patients: EntrepreneurPulse.com.au  Fact Sheet for Healthcare Providers: IncredibleEmployment.be  This test is not yet approved or cleared by the [place] FDA and has been authorized for detection and/or diagnosis of SARS-CoV-2 by FDA under an Emergency Use Authorization (EUA). This EUA will  remain in effect (meaning this test can be used) for the duration of the COVID-19 declaration under Section 564(b)(1) of the Act, 21 U.S.C. section 360bbb-3(b)(1), unless the authorization is terminated or  revoked.  Performed at [facility], [place] [address], [place], [place] [address]    SARS Coronavirus 2 Ag 04/07/2021 Negative  Negative Preliminary   WBC 04/07/2021 5.0  4.0 - 10.5 K/uL Final   RBC 04/07/2021 5.06  4.22 - 5.81 MIL/uL Final   Hemoglobin 04/07/2021 15.1  13.0 - 17.0 g/dL Final   HCT 04/07/2021 44.8  39.0 - 52.0 % Final   MCV 04/07/2021 88.5  80.0 - 100.0 fL Final   MCH 04/07/2021 29.8  26.0 - 34.0 pg Final   MCHC 04/07/2021 33.7  30.0 - 36.0 g/dL Final   RDW 04/07/2021 12.5  11.5 - 15.5 % Final   Platelets 04/07/2021 228  150 - 400 K/uL Final   nRBC 04/07/2021 0.0  0.0 - 0.2 % Final   Neutrophils Relative % 04/07/2021 41  % Final   Neutro Abs 04/07/2021 2.0  1.7 - 7.7 K/uL Final   Lymphocytes Relative 04/07/2021 48  % Final   Lymphs Abs 04/07/2021 2.3  0.7 - 4.0 K/uL Final   Monocytes Relative 04/07/2021 10  % Final   Monocytes Absolute 04/07/2021 0.5  0.1 - 1.0 K/uL Final   Eosinophils Relative 04/07/2021 1  % Final   Eosinophils Absolute 04/07/2021 0.1  0.0 - 0.5 K/uL Final   Basophils Relative 04/07/2021 0  % Final   Basophils Absolute 04/07/2021 0.0  0.0 - 0.1 K/uL Final   Immature Granulocytes 04/07/2021 0  % Final   Abs Immature Granulocytes 04/07/2021 0.02  0.00 - 0.07 K/uL Final   Performed at [facility], [place] [address] [place], [place] [address]   Sodium 04/07/2021 137  135 - 145 mmol/L Final   Potassium 04/07/2021 4.2  3.5 - 5.1 mmol/L Final   Chloride 04/07/2021 98  98 - 111 mmol/L Final   CO2 04/07/2021 30  22 - 32 mmol/L Final   Glucose, Bld 04/07/2021 103 (H)  70 - 99 mg/dL Final   Glucose reference range applies only to samples taken after fasting for at least 8 hours.   BUN 04/07/2021 8  6 - 20 mg/dL Final   Creatinine, Ser  04/07/2021 1.32 (H)  0.61 - 1.24 mg/dL Final   Calcium 04/07/2021 9.7  8.9 - 10.3 mg/dL Final   Total Protein 04/07/2021 6.8  6.5 - 8.1 g/dL Final   Albumin 04/07/2021 4.1  3.5 - 5.0 g/dL Final   AST 04/07/2021 25  15 - 41 U/L Final   ALT 04/07/2021 25  0 - 44 U/L Final   Alkaline Phosphatase 04/07/2021 79  38 - 126 U/L Final   Total Bilirubin 04/07/2021 0.3  0.3 - 1.2 mg/dL Final   GFR, Estimated 04/07/2021 >60  >60 mL/min Final   Comment: (NOTE) Calculated using the CKD-EPI Creatinine Equation (2021)    Anion gap 04/07/2021 9  5 - 15 Final   Performed at [facility], [place] [address] [place], [place] [address]   Hgb A1c MFr Bld 04/07/2021 5.8 (H)  4.8 - 5.6 % Final   Comment: (NOTE) Pre diabetes:          5.7%-6.4%  Diabetes:              >6.4%  Glycemic control for   <7.0% adults with diabetes    Mean Plasma Glucose 04/07/2021 119.76  mg/dL Final   Performed at [facility], [place] [address] [place], [place] [address]   Alcohol, Ethyl (B) 04/07/2021 <10  <10 mg/dL  Final   Comment: (NOTE) Lowest detectable limit for serum alcohol is 10 mg/dL.  For medical purposes only. Performed at [facility], [place] [address], [place], [place] [address]    Cholesterol 04/07/2021 190  0 - 200 mg/dL Final   Triglycerides 04/07/2021 181 (H)  <150 mg/dL Final   HDL 04/07/2021 46  >40 mg/dL Final   Total CHOL/HDL Ratio 04/07/2021 4.1  RATIO Final   VLDL 04/07/2021 36  0 - 40 mg/dL Final   LDL Cholesterol 04/07/2021 108 (H)  0 - 99 mg/dL Final   Comment:        Total Cholesterol/HDL:CHD Risk Coronary Heart Disease Risk Table                     Men   Women  1/2 Average Risk   3.4   3.3  Average Risk       5.0   4.4  2 X Average Risk   9.6   7.1  3 X Average Risk  23.4   11.0        Use the calculated Patient Ratio above and the CHD Risk Table to determine the patient's CHD Risk.        ATP III CLASSIFICATION (LDL):  <100     mg/dL   Optimal  100-129  mg/dL   Near  or Above                    Optimal  130-159  mg/dL   Borderline  160-189  mg/dL   High  >190     mg/dL   Very High Performed at [place] [address] [place],  [address]    TSH 04/07/2021 2.036  0.350 - 4.500 uIU/mL Final   Comment: Performed by a 3rd Generation assay with a functional sensitivity of <=0.01 uIU/mL. Performed at [facility], [place] [address] [place], [place] [address]    POC Amphetamine UR 04/07/2021 None Detected  NONE DETECTED (Cut Off Level 1000 ng/mL) Final   POC Secobarbital (BAR) 04/07/2021 None Detected  NONE DETECTED (Cut Off Level 300 ng/mL) Final   POC Buprenorphine (BUP) 04/07/2021 Positive (A)  NONE DETECTED (Cut Off Level 10 ng/mL) Final   POC Oxazepam (BZO) 04/07/2021 None Detected  NONE DETECTED (Cut Off Level 300 ng/mL) Final   POC Cocaine UR 04/07/2021 Positive (A)  NONE DETECTED (Cut Off Level 300 ng/mL) Final   POC Methamphetamine UR 04/07/2021 None Detected  NONE DETECTED (Cut Off Level 1000 ng/mL) Final   POC Morphine 04/07/2021 None Detected  NONE DETECTED (Cut Off Level 300 ng/mL) Final   POC Oxycodone UR 04/07/2021 None Detected  NONE DETECTED (Cut Off Level 100 ng/mL) Final   POC Methadone UR 04/07/2021 None Detected  NONE DETECTED (Cut Off Level 300 ng/mL) Final   POC Marijuana UR 04/07/2021 None Detected  NONE DETECTED (Cut Off Level 50 ng/mL) Final   SARSCOV2ONAVIRUS 2 AG 04/07/2021 NEGATIVE  NEGATIVE Final   Comment: (NOTE) SARS-CoV-2 antigen NOT DETECTED.   Negative results are presumptive.  Negative results do not preclude SARS-CoV-2 infection and should not be used as the sole basis for treatment or other patient management decisions, including infection  control decisions, particularly in the presence of clinical signs and  symptoms consistent with COVID-19, or in those who have been in contact with the virus.  Negative results must be combined with clinical observations, patient history, and  epidemiological information. The  expected result is Negative.  Fact Sheet for Patients: HandmadeRecipes.com.cy  Fact Sheet for Healthcare Providers: FuneralLife.at  This test is not yet approved or cleared by the [place] FDA and  has been authorized for detection and/or diagnosis of SARS-CoV-2 by FDA under an Emergency Use Authorization (EUA).  This EUA will remain in effect (meaning this test can be used) for the duration of  the COV                          ID-19 declaration under Section 564(b)(1) of the Act, 21 U.S.C. section 360bbb-3(b)(1), unless the authorization is terminated or revoked sooner.    Admission on 03/16/2021, Discharged on 03/17/2021  Component Date Value Ref Range Status   SARS Coronavirus 2 by RT PCR 03/16/2021 NEGATIVE  NEGATIVE Final   Comment: (NOTE) SARS-CoV-2 target nucleic acids are NOT DETECTED.  The SARS-CoV-2 RNA is generally detectable in upper respiratory specimens during the acute phase of infection. The lowest concentration of SARS-CoV-2 viral copies this assay can detect is 138 copies/mL. A negative result does not preclude SARS-Cov-2 infection and should not be used as the sole basis for treatment or other patient management decisions. A negative result may occur with  improper specimen collection/handling, submission of specimen other than nasopharyngeal swab, presence of viral mutation(s) within the areas targeted by this assay, and inadequate number of viral copies(<138 copies/mL). A negative result must be combined with clinical observations, patient history, and epidemiological information. The expected result is Negative.  Fact Sheet for Patients:  EntrepreneurPulse.com.au  Fact Sheet for Healthcare Providers:  IncredibleEmployment.be  This test is no                          t yet approved or cleared by the [place] FDA and  has been  authorized for detection and/or diagnosis of SARS-CoV-2 by FDA under an Emergency Use Authorization (EUA). This EUA will remain  in effect (meaning this test can be used) for the duration of the COVID-19 declaration under Section 564(b)(1) of the Act, 21 U.S.C.section 360bbb-3(b)(1), unless the authorization is terminated  or revoked sooner.       Influenza A by PCR 03/16/2021 NEGATIVE  NEGATIVE Final   Influenza B by PCR 03/16/2021 NEGATIVE  NEGATIVE Final   Comment: (NOTE) The Xpert Xpress SARS-CoV-2/FLU/RSV plus assay is intended as an aid in the diagnosis of influenza from Nasopharyngeal swab specimens and should not be used as a sole basis for treatment. Nasal washings and aspirates are unacceptable for Xpert Xpress SARS-CoV-2/FLU/RSV testing.  Fact Sheet for Patients: EntrepreneurPulse.com.au  Fact Sheet for Healthcare Providers: IncredibleEmployment.be  This test is not yet approved or cleared by the [place] FDA and has been authorized for detection and/or diagnosis of SARS-CoV-2 by FDA under an Emergency Use Authorization (EUA). This EUA will remain in effect (meaning this test can be used) for the duration of the COVID-19 declaration under Section 564(b)(1) of the Act, 21 U.S.C. section 360bbb-3(b)(1), unless the authorization is terminated or revoked.  Performed at [facility], [place] [address], [place], [place] [address]    Sodium 03/16/2021 138  135 - 145 mmol/L Final   Potassium 03/16/2021 4.2  3.5 - 5.1 mmol/L Final   Chloride 03/16/2021 101  98 - 111 mmol/L Final   CO2 03/16/2021 30  22 - 32 mmol/L Final   Glucose, Bld 03/16/2021 92  70 - 99 mg/dL  Final   Glucose reference range applies only to samples taken after fasting for at least 8 hours.   BUN 03/16/2021 14  6 - 20 mg/dL Final   Creatinine, Ser 03/16/2021 1.18  0.61 - 1.24 mg/dL Final   Calcium 03/16/2021 9.5  8.9 - 10.3 mg/dL Final   Total  Protein 03/16/2021 7.5  6.5 - 8.1 g/dL Final   Albumin 03/16/2021 4.5  3.5 - 5.0 g/dL Final   AST 03/16/2021 27  15 - 41 U/L Final   ALT 03/16/2021 30  0 - 44 U/L Final   Alkaline Phosphatase 03/16/2021 69  38 - 126 U/L Final   Total Bilirubin 03/16/2021 0.7  0.3 - 1.2 mg/dL Final   GFR, Estimated 03/16/2021 >60  >60 mL/min Final   Comment: (NOTE) Calculated using the CKD-EPI Creatinine Equation (2021)    Anion gap 03/16/2021 7  5 - 15 Final   Performed at [facility], [place] [address] [place], [place] [address]   Alcohol, Ethyl (B) 03/16/2021 <10  <10 mg/dL Final   Comment: (NOTE) Lowest detectable limit for serum alcohol is 10 mg/dL.  For medical purposes only. Performed at [facility], [place] [address], [place], [place] [address]    Opiates 03/16/2021 NONE DETECTED  NONE DETECTED Final   Cocaine 03/16/2021 NONE DETECTED  NONE DETECTED Final   Benzodiazepines 03/16/2021 NONE DETECTED  NONE DETECTED Final   Amphetamines 03/16/2021 NONE DETECTED  NONE DETECTED Final   Tetrahydrocannabinol 03/16/2021 POSITIVE (A)  NONE DETECTED Final   Barbiturates 03/16/2021 NONE DETECTED  NONE DETECTED Final   Comment: (NOTE) DRUG SCREEN FOR MEDICAL PURPOSES ONLY.  IF CONFIRMATION IS NEEDED FOR ANY PURPOSE, NOTIFY LAB WITHIN 5 DAYS.  LOWEST DETECTABLE LIMITS FOR URINE DRUG SCREEN Drug Class                     Cutoff (ng/mL) Amphetamine and metabolites    1000 Barbiturate and metabolites    200 Benzodiazepine                 [id number] Tricyclics and metabolites     300 Opiates and metabolites        300 Cocaine and metabolites        300 THC                            50 Performed at [facility], [place] [address], [place], [place] [address]    WBC 03/16/2021 4.6  4.0 - 10.5 K/uL Final   RBC 03/16/2021 5.39  4.22 - 5.81 MIL/uL Final   Hemoglobin 03/16/2021 16.2  13.0 - 17.0 g/dL Final   HCT 03/16/2021 49.6  39.0 - 52.0 % Final   MCV  03/16/2021 92.0  80.0 - 100.0 fL Final   MCH 03/16/2021 30.1  26.0 - 34.0 pg Final   MCHC 03/16/2021 32.7  30.0 - 36.0 g/dL Final   RDW 03/16/2021 13.0  11.5 - 15.5 % Final   Platelets 03/16/2021 224  150 - 400 K/uL Final   nRBC 03/16/2021 0.0  0.0 - 0.2 % Final   Neutrophils Relative % 03/16/2021 44  % Final   Neutro Abs 03/16/2021 2.0  1.7 - 7.7 K/uL Final   Lymphocytes Relative 03/16/2021 42  % Final   Lymphs Abs 03/16/2021 1.9  0.7 - 4.0 K/uL Final   Monocytes Relative 03/16/2021 11  % Final   Monocytes Absolute  03/16/2021 0.5  0.1 - 1.0 K/uL Final   Eosinophils Relative 03/16/2021 2  % Final   Eosinophils Absolute 03/16/2021 0.1  0.0 - 0.5 K/uL Final   Basophils Relative 03/16/2021 1  % Final   Basophils Absolute 03/16/2021 0.0  0.0 - 0.1 K/uL Final   Immature Granulocytes 03/16/2021 0  % Final   Abs Immature Granulocytes 03/16/2021 0.01  0.00 - 0.07 K/uL Final   Performed at [facility], [place] [address], [place], [place] [address]   Acetaminophen (Tylenol), Serum 03/16/2021 <10 (L)  10 - 30 ug/mL Final   Comment: (NOTE) Therapeutic concentrations vary significantly. A range of 10-30 ug/mL  may be an effective concentration for many patients. However, some  are best treated at concentrations outside of this range. Acetaminophen concentrations >150 ug/mL at 4 hours after ingestion  and >50 ug/mL at 12 hours after ingestion are often associated with  toxic reactions.  Performed at [facility], [place] [address], [place], [place] [id number]    Salicylate Lvl 123456 <7.0 (L)  7.0 - 30.0 mg/dL Final   Performed at [place] [address], [place], [place] [address]  Admission on 03/09/2021, Discharged on 03/09/2021  Component Date Value Ref Range Status   WBC 03/09/2021 7.6  4.0 - 10.5 K/uL Final   RBC 03/09/2021 5.27  4.22 - 5.81 MIL/uL Final   Hemoglobin 03/09/2021 15.8  13.0 - 17.0 g/dL Final   HCT 03/09/2021 46.0   39.0 - 52.0 % Final   MCV 03/09/2021 87.3  80.0 - 100.0 fL Final   MCH 03/09/2021 30.0  26.0 - 34.0 pg Final   MCHC 03/09/2021 34.3  30.0 - 36.0 g/dL Final   RDW 03/09/2021 12.8  11.5 - 15.5 % Final   Platelets 03/09/2021 270  150 - 400 K/uL Final   nRBC 03/09/2021 0.0  0.0 - 0.2 % Final   Neutrophils Relative % 03/09/2021 71  % Final   Neutro Abs 03/09/2021 5.3  1.7 - 7.7 K/uL Final   Lymphocytes Relative 03/09/2021 24  % Final   Lymphs Abs 03/09/2021 1.8  0.7 - 4.0 K/uL Final   Monocytes Relative 03/09/2021 5  % Final   Monocytes Absolute 03/09/2021 0.4  0.1 - 1.0 K/uL Final   Eosinophils Relative 03/09/2021 0  % Final   Eosinophils Absolute 03/09/2021 0.0  0.0 - 0.5 K/uL Final   Basophils Relative 03/09/2021 0  % Final   Basophils Absolute 03/09/2021 0.0  0.0 - 0.1 K/uL Final   Immature Granulocytes 03/09/2021 0  % Final   Abs Immature Granulocytes 03/09/2021 0.03  0.00 - 0.07 K/uL Final   Performed at [facility], [address] [place], [place] [address]   Sodium 03/09/2021 134 (L)  135 - 145 mmol/L Final   Potassium 03/09/2021 4.1  3.5 - 5.1 mmol/L Final   Chloride 03/09/2021 97 (L)  98 - 111 mmol/L Final   CO2 03/09/2021 29  22 - 32 mmol/L Final   Glucose, Bld 03/09/2021 116 (H)  70 - 99 mg/dL Final   Glucose reference range applies only to samples taken after fasting for at least 8 hours.   BUN 03/09/2021 16  6 - 20 mg/dL Final   Creatinine, Ser 03/09/2021 1.15  0.61 - 1.24 mg/dL Final   Calcium 03/09/2021 9.8  8.9 - 10.3 mg/dL Final   Total Protein 03/09/2021 7.8  6.5 - 8.1 g/dL Final   Albumin 03/09/2021 4.6  3.5 -  5.0 g/dL Final   AST 03/09/2021 42 (H)  15 - 41 U/L Final   ALT 03/09/2021 107 (H)  0 - 44 U/L Final   Alkaline Phosphatase 03/09/2021 79  38 - 126 U/L Final   Total Bilirubin 03/09/2021 1.0  0.3 - 1.2 mg/dL Final   GFR, Estimated 03/09/2021 >60  >60 mL/min Final   Comment: (NOTE) Calculated using the CKD-EPI Creatinine Equation (2021)    Anion  gap 03/09/2021 8  5 - 15 Final   Performed at [facility], [address] [place], [place] [address]   Alcohol, Ethyl (B) 03/09/2021 <10  <10 mg/dL Final   Comment: (NOTE) Lowest detectable limit for serum alcohol is 10 mg/dL.  For medical purposes only. Performed at [facility], [place] [address] [place], [place] [id number]    Tricyclic, Ur Screen [id number] NONE DETECTED  NONE DETECTED Final   Amphetamines, Ur Screen 03/09/2021 NONE DETECTED  NONE DETECTED Final   MDMA (Ecstasy)Ur Screen 03/09/2021 NONE DETECTED  NONE DETECTED Final   Cocaine Metabolite,Ur [place] 03/09/2021 POSITIVE (A)  NONE DETECTED Final   Opiate, Ur Screen 03/09/2021 NONE DETECTED  NONE DETECTED Final   Phencyclidine (PCP) Ur S 03/09/2021 NONE DETECTED  NONE DETECTED Final   Cannabinoid 50 Ng, Ur [place] 03/09/2021 POSITIVE (A)  NONE DETECTED Final   Barbiturates, Ur Screen 03/09/2021 NONE DETECTED  NONE DETECTED Final   Benzodiazepine, Ur Scrn 03/09/2021 NONE DETECTED  NONE DETECTED Final   Methadone Scn, Ur 03/09/2021 NONE DETECTED  NONE DETECTED Final   Comment: (NOTE) Tricyclics + metabolites, urine    Cutoff 1000 ng/mL Amphetamines + metabolites, urine  Cutoff 1000 ng/mL MDMA (Ecstasy), urine              Cutoff 500 ng/mL Cocaine Metabolite, urine          Cutoff 300 ng/mL Opiate + metabolites, urine        Cutoff 300 ng/mL Phencyclidine (PCP), urine         Cutoff 25 ng/mL Cannabinoid, urine                 Cutoff 50 ng/mL Barbiturates + metabolites, urine  Cutoff 200 ng/mL Benzodiazepine, urine              Cutoff 200 ng/mL Methadone, urine                   Cutoff 300 ng/mL  The urine drug screen provides only a preliminary, unconfirmed analytical test result and should not be used for non-medical purposes. Clinical consideration and professional judgment should be applied to any positive drug screen result due to possible interfering substances. A more specific alternate chemical  method must be used in order to obtain a confirmed analytical result. Gas chromatography / mass spectrometry (GC/MS) is the preferred confirm                          atory method. Performed at [facility], [address] [place], [place] [address]   Admission on 02/17/2021, Discharged on 02/19/2021  Component Date Value Ref Range Status   Sodium 02/17/2021 132 (L)  135 - 145 mmol/L Final   Potassium 02/17/2021 4.2  3.5 - 5.1 mmol/L Final   Chloride 02/17/2021 102  98 - 111 mmol/L Final   CO2 02/17/2021 25  22 - 32 mmol/L Final   Glucose, Bld 02/17/2021 93  70 - 99 mg/dL Final   Glucose  reference range applies only to samples taken after fasting for at least 8 hours.   BUN 02/17/2021 21 (H)  6 - 20 mg/dL Final   Creatinine, Ser 02/17/2021 1.25 (H)  0.61 - 1.24 mg/dL Final   Calcium 02/17/2021 9.1  8.9 - 10.3 mg/dL Final   Total Protein 02/17/2021 7.6  6.5 - 8.1 g/dL Final   Albumin 02/17/2021 4.2  3.5 - 5.0 g/dL Final   AST 02/17/2021 22  15 - 41 U/L Final   ALT 02/17/2021 18  0 - 44 U/L Final   Alkaline Phosphatase 02/17/2021 74  38 - 126 U/L Final   Total Bilirubin 02/17/2021 0.7  0.3 - 1.2 mg/dL Final   GFR, Estimated 02/17/2021 >60  >60 mL/min Final   Comment: (NOTE) Calculated using the CKD-EPI Creatinine Equation (2021)    Anion gap 02/17/2021 5  5 - 15 Final   Performed at [facility], [place] [address] [place], [place] [address]   Alcohol, Ethyl (B) 02/17/2021 <10  <10 mg/dL Final   Comment: (NOTE) Lowest detectable limit for serum alcohol is 10 mg/dL.  For medical purposes only. Performed at [facility] [address] [place], [place] [id number]    Salicylate Lvl [id number] <7.0 (L)  7.0 - 30.0 mg/dL Final   Performed at [facility], [place], [place] [address]   Acetaminophen (Tylenol), Serum 02/17/2021 <10 (L)  10 - 30 ug/mL Final   Comment: (NOTE) Therapeutic concentrations vary significantly. A range of  10-30 ug/mL  may be an effective concentration for many patients. However, some  are best treated at concentrations outside of this range. Acetaminophen concentrations >150 ug/mL at 4 hours after ingestion  and >50 ug/mL at 12 hours after ingestion are often associated with  toxic reactions.  Performed at [facility], [address] [place], [place] [address]    WBC 02/17/2021 10.2  4.0 - 10.5 K/uL Final   RBC 02/17/2021 5.30  4.22 - 5.81 MIL/uL Final   Hemoglobin 02/17/2021 15.7  13.0 - 17.0 g/dL Final   HCT 02/17/2021 46.1  39.0 - 52.0 % Final   MCV 02/17/2021 87.0  80.0 - 100.0 fL Final   MCH 02/17/2021 29.6  26.0 - 34.0 pg Final   MCHC 02/17/2021 34.1  30.0 - 36.0 g/dL Final   RDW 02/17/2021 12.7  11.5 - 15.5 % Final   Platelets 02/17/2021 269  150 - 400 K/uL Final   nRBC 02/17/2021 0.0  0.0 - 0.2 % Final   Performed at [facility], [address] [place], [place] [id number]   Tricyclic, Ur Screen [id number] NONE DETECTED  NONE DETECTED Final   Amphetamines, Ur Screen 02/17/2021 POSITIVE (A)  NONE DETECTED Final   MDMA (Ecstasy)Ur Screen 02/17/2021 NONE DETECTED  NONE DETECTED Final   Cocaine Metabolite,Ur [place] 02/17/2021 POSITIVE (A)  NONE DETECTED Final   Opiate, Ur Screen 02/17/2021 NONE DETECTED  NONE DETECTED Final   Phencyclidine (PCP) Ur S 02/17/2021 NONE DETECTED  NONE DETECTED Final   Cannabinoid 50 Ng, Ur [place] 02/17/2021 POSITIVE (A)  NONE DETECTED Final   Barbiturates, Ur Screen 02/17/2021 NONE DETECTED  NONE DETECTED Final   Benzodiazepine, Ur Scrn 02/17/2021 NONE DETECTED  NONE DETECTED Final   Methadone Scn, Ur 02/17/2021 NONE DETECTED  NONE DETECTED Final   Comment: (NOTE) Tricyclics + metabolites, urine    Cutoff 1000 ng/mL Amphetamines + metabolites, urine  Cutoff 1000 ng/mL MDMA (Ecstasy), urine  Cutoff 500 ng/mL Cocaine Metabolite, urine          Cutoff 300 ng/mL Opiate + metabolites, urine        Cutoff 300  ng/mL Phencyclidine (PCP), urine         Cutoff 25 ng/mL Cannabinoid, urine                 Cutoff 50 ng/mL Barbiturates + metabolites, urine  Cutoff 200 ng/mL Benzodiazepine, urine              Cutoff 200 ng/mL Methadone, urine                   Cutoff 300 ng/mL  The urine drug screen provides only a preliminary, unconfirmed analytical test result and should not be used for non-medical purposes. Clinical consideration and professional judgment should be applied to any positive drug screen result due to possible interfering substances. A more specific alternate chemical method must be used in order to obtain a confirmed analytical result. Gas chromatography / mass spectrometry (GC/MS) is the preferred confirm                          atory method. Performed at [facility], [address] [place], [place] [address]    Total CK 02/17/2021 83  49 - 397 U/L Final   Performed at [facility], [address] [place], [place] [address]   SARS Coronavirus 2 by RT PCR 02/17/2021 NEGATIVE  NEGATIVE Final   Comment: (NOTE) SARS-CoV-2 target nucleic acids are NOT DETECTED.  The SARS-CoV-2 RNA is generally detectable in upper respiratory specimens during the acute phase of infection. The lowest concentration of SARS-CoV-2 viral copies this assay can detect is 138 copies/mL. A negative result does not preclude SARS-Cov-2 infection and should not be used as the sole basis for treatment or other patient management decisions. A negative result may occur with  improper specimen collection/handling, submission of specimen other than nasopharyngeal swab, presence of viral mutation(s) within the areas targeted by this assay, and inadequate number of viral copies(<138 copies/mL). A negative result must be combined with clinical observations, patient history, and epidemiological information. The expected result is Negative.  Fact Sheet for Patients:   BloggerCourse.com  Fact Sheet for Healthcare Providers:  SeriousBroker.it  This test is no                          t yet approved or cleared by the [place] FDA and  has been authorized for detection and/or diagnosis of SARS-CoV-2 by FDA under an Emergency Use Authorization (EUA). This EUA will remain  in effect (meaning this test can be used) for the duration of the COVID-19 declaration under Section 564(b)(1) of the Act, 21 U.S.C.section 360bbb-3(b)(1), unless the authorization is terminated  or revoked sooner.       Influenza A by PCR 02/17/2021 NEGATIVE  NEGATIVE Final   Influenza B by PCR 02/17/2021 NEGATIVE  NEGATIVE Final   Comment: (NOTE) The Xpert Xpress SARS-CoV-2/FLU/RSV plus assay is intended as an aid in the diagnosis of influenza from Nasopharyngeal swab specimens and should not be used as a sole basis for treatment. Nasal washings and aspirates are unacceptable for Xpert Xpress SARS-CoV-2/FLU/RSV testing.  Fact Sheet for Patients: BloggerCourse.com  Fact Sheet for Healthcare Providers: SeriousBroker.it  This test is not yet approved or cleared by the [place] and has been authorized for  detection and/or diagnosis of SARS-CoV-2 by FDA under an Emergency Use Authorization (EUA). This EUA will remain in effect (meaning this test can be used) for the duration of the COVID-19 declaration under Section 564(b)(1) of the Act, 21 U.S.C. section 360bbb-3(b)(1), unless the authorization is terminated or revoked.  Performed at [facility], [address] [place], [place] [address]   Admission on 12/06/2020, Discharged on 12/09/2020  Component Date Value Ref Range Status   SARS Coronavirus 2 by RT PCR 12/06/2020 NEGATIVE  NEGATIVE Final   Comment: (NOTE) SARS-CoV-2 target nucleic acids are NOT DETECTED.  The SARS-CoV-2 RNA is generally  detectable in upper respiratory specimens during the acute phase of infection. The lowest concentration of SARS-CoV-2 viral copies this assay can detect is 138 copies/mL. A negative result does not preclude SARS-Cov-2 infection and should not be used as the sole basis for treatment or other patient management decisions. A negative result may occur with  improper specimen collection/handling, submission of specimen other than nasopharyngeal swab, presence of viral mutation(s) within the areas targeted by this assay, and inadequate number of viral copies(<138 copies/mL). A negative result must be combined with clinical observations, patient history, and epidemiological information. The expected result is Negative.  Fact Sheet for Patients:  BloggerCourse.com  Fact Sheet for Healthcare Providers:  SeriousBroker.it  This test is no                          t yet approved or cleared by the [place] FDA and  has been authorized for detection and/or diagnosis of SARS-CoV-2 by FDA under an Emergency Use Authorization (EUA). This EUA will remain  in effect (meaning this test can be used) for the duration of the COVID-19 declaration under Section 564(b)(1) of the Act, 21 U.S.C.section 360bbb-3(b)(1), unless the authorization is terminated  or revoked sooner.       Influenza A by PCR 12/06/2020 NEGATIVE  NEGATIVE Final   Influenza B by PCR 12/06/2020 NEGATIVE  NEGATIVE Final   Comment: (NOTE) The Xpert Xpress SARS-CoV-2/FLU/RSV plus assay is intended as an aid in the diagnosis of influenza from Nasopharyngeal swab specimens and should not be used as a sole basis for treatment. Nasal washings and aspirates are unacceptable for Xpert Xpress SARS-CoV-2/FLU/RSV testing.  Fact Sheet for Patients: BloggerCourse.com  Fact Sheet for Healthcare Providers: SeriousBroker.it  This test is not  yet approved or cleared by the [place] FDA and has been authorized for detection and/or diagnosis of SARS-CoV-2 by FDA under an Emergency Use Authorization (EUA). This EUA will remain in effect (meaning this test can be used) for the duration of the COVID-19 declaration under Section 564(b)(1) of the Act, 21 U.S.C. section 360bbb-3(b)(1), unless the authorization is terminated or revoked.  Performed at [facility], [address] [place], [place] [address]    Sodium 12/06/2020 137  135 - 145 mmol/L Final   ELECTROLYTES REPEATED TO VERIFY GAA   Potassium 12/06/2020 4.0  3.5 - 5.1 mmol/L Final   Chloride 12/06/2020 96 (L)  98 - 111 mmol/L Final   CO2 12/06/2020 16 (L)  22 - 32 mmol/L Final   Glucose, Bld 12/06/2020 276 (H)  70 - 99 mg/dL Final   Glucose reference range applies only to samples taken after fasting for at least 8 hours.   BUN 12/06/2020 20  6 - 20 mg/dL Final   Creatinine, Ser 12/06/2020 2.44 (H)  0.61 - 1.24 mg/dL Final  Calcium 12/06/2020 9.9  8.9 - 10.3 mg/dL Final   Total Protein 12/06/2020 7.7  6.5 - 8.1 g/dL Final   Albumin 12/06/2020 4.4  3.5 - 5.0 g/dL Final   AST 12/06/2020 86 (H)  15 - 41 U/L Final   ALT 12/06/2020 43  0 - 44 U/L Final   Alkaline Phosphatase 12/06/2020 70  38 - 126 U/L Final   Total Bilirubin 12/06/2020 0.8  0.3 - 1.2 mg/dL Final   GFR, Estimated 12/06/2020 36 (L)  >60 mL/min Final   Comment: (NOTE) Calculated using the CKD-EPI Creatinine Equation (2021)    Anion gap 12/06/2020 25 (H)  5 - 15 Final   Performed at [facility], [address] [place], [place] [address]   WBC 12/06/2020 9.8  4.0 - 10.5 K/uL Final   RBC 12/06/2020 5.89 (H)  4.22 - 5.81 MIL/uL Final   Hemoglobin 12/06/2020 17.3 (H)  13.0 - 17.0 g/dL Final   HCT 12/06/2020 52.4 (H)  39.0 - 52.0 % Final   MCV 12/06/2020 89.0  80.0 - 100.0 fL Final   MCH 12/06/2020 29.4  26.0 - 34.0 pg Final   MCHC 12/06/2020 33.0  30.0 - 36.0 g/dL Final   RDW  12/06/2020 12.2  11.5 - 15.5 % Final   Platelets 12/06/2020 302  150 - 400 K/uL Final   nRBC 12/06/2020 0.0  0.0 - 0.2 % Final   Performed at [facility], [address] [place], [place] [address]   Alcohol, Ethyl (B) 12/06/2020 <10  <10 mg/dL Final   Comment: (NOTE) Lowest detectable limit for serum alcohol is 10 mg/dL.  For medical purposes only. Performed at [facility], [address] [place], [place] [address]    Lactic Acid, Venous 12/06/2020 >9.0 (HH)  0.5 - 1.9 mmol/L Final   Comment: CRITICAL RESULT CALLED TO, READ BACK BY AND VERIFIED WITH Melina Schools RN AT 1037 ON 12/06/20 GAA Performed at [facility], [address] [place], [place] [address]    Prothrombin Time 12/06/2020 14.9  11.4 - 15.2 seconds Final   INR 12/06/2020 1.2  0.8 - 1.2 Final   Comment: (NOTE) INR goal varies based on device and disease states. Performed at [facility], [place], [place] [address]    Color, Urine 12/07/2020 RED (A)  YELLOW Final   APPearance 12/07/2020 BLOODY (A)  CLEAR Final   Specific Gravity, Urine 12/07/2020 1.048 (H)  1.005 - 1.030 Final   pH 12/07/2020 TEST NOT REPORTED DUE TO COLOR INTERFERENCE OF URINE PIGMENT  5.0 - 8.0 Final   Glucose, UA 12/07/2020 TEST NOT REPORTED DUE TO COLOR INTERFERENCE OF URINE PIGMENT (A)  NEGATIVE mg/dL Final   Hgb urine dipstick 12/07/2020 TEST NOT REPORTED DUE TO COLOR INTERFERENCE OF URINE PIGMENT (A)  NEGATIVE Final   Bilirubin Urine 12/07/2020 TEST NOT REPORTED DUE TO COLOR INTERFERENCE OF URINE PIGMENT (A)  NEGATIVE Final   Ketones, ur 12/07/2020 TEST NOT REPORTED DUE TO COLOR INTERFERENCE OF URINE PIGMENT (A)  NEGATIVE mg/dL Final   Protein, ur 12/07/2020 TEST NOT REPORTED DUE TO COLOR INTERFERENCE OF URINE PIGMENT (A)  NEGATIVE mg/dL Final   Nitrite 12/07/2020 TEST NOT REPORTED DUE TO COLOR INTERFERENCE OF URINE PIGMENT (A)  NEGATIVE Final   Leukocytes,Ua 12/07/2020 TEST NOT REPORTED  DUE TO COLOR INTERFERENCE OF URINE PIGMENT (A)  NEGATIVE Final   Performed at [facility], [address] [place], [place] [address]   CK, MB 12/06/2020  3.9  0.5 - 5.0 ng/mL Final   Performed at [facility], [address] [place], [place] [id number]   Tricyclic, Ur Screen [id number] NONE DETECTED  NONE DETECTED Final   Amphetamines, Ur Screen 12/07/2020 NONE DETECTED  NONE DETECTED Final   MDMA (Ecstasy)Ur Screen 12/07/2020 NONE DETECTED  NONE DETECTED Final   Cocaine Metabolite,Ur [place] 12/07/2020 POSITIVE (A)  NONE DETECTED Final   Opiate, Ur Screen 12/07/2020 NONE DETECTED  NONE DETECTED Final   Phencyclidine (PCP) Ur S 12/07/2020 NONE DETECTED  NONE DETECTED Final   Cannabinoid 50 Ng, Ur [place] 12/07/2020 POSITIVE (A)  NONE DETECTED Final   Barbiturates, Ur Screen 12/07/2020 NONE DETECTED  NONE DETECTED Final   Benzodiazepine, Ur Scrn 12/07/2020 POSITIVE (A)  NONE DETECTED Final   Methadone Scn, Ur 12/07/2020 NONE DETECTED  NONE DETECTED Final   Comment: (NOTE) Tricyclics + metabolites, urine    Cutoff 1000 ng/mL Amphetamines + metabolites, urine  Cutoff 1000 ng/mL MDMA (Ecstasy), urine              Cutoff 500 ng/mL Cocaine Metabolite, urine          Cutoff 300 ng/mL Opiate + metabolites, urine        Cutoff 300 ng/mL Phencyclidine (PCP), urine         Cutoff 25 ng/mL Cannabinoid, urine                 Cutoff 50 ng/mL Barbiturates + metabolites, urine  Cutoff 200 ng/mL Benzodiazepine, urine              Cutoff 200 ng/mL Methadone, urine                   Cutoff 300 ng/mL  The urine drug screen provides only a preliminary, unconfirmed analytical test result and should not be used for non-medical purposes. Clinical consideration and professional judgment should be applied to any positive drug screen result due to possible interfering substances. A more specific alternate chemical method must be used in order to obtain a confirmed analytical result. Gas  chromatography / mass spectrometry (GC/MS) is the preferred confirm                          atory method. Performed at [facility], [address] [place], [place] [address]    ABO/RH(D) 12/06/2020 A POS   Final   Antibody Screen 12/06/2020 NEG   Final   Sample Expiration 12/06/2020    Final                   Value:12/09/2020,2359 Performed at [facility], [address] [place], [place] [address]    FIO2 12/06/2020 40.00   Final   Delivery systems 12/06/2020 VENTILATOR   Final   Mode 12/06/2020 ASSIST CONTROL   Final   VT 12/06/2020 440  mL Final   LHR 12/06/2020 20  resp/min Final   Peep/cpap 12/06/2020 5.0  cm H20 Final   pH, Arterial 12/06/2020 7.42  7.350 - 7.450 Final   pCO2 arterial 12/06/2020 37  32.0 - 48.0 mmHg Final   pO2, Arterial 12/06/2020 171 (H)  83.0 - 108.0 mmHg Final   Bicarbonate 12/06/2020 24.0  20.0 - 28.0 mmol/L Final   Acid-base deficit 12/06/2020 0.2  0.0 - 2.0 mmol/L Final   O2 Saturation 12/06/2020 99.6  % Final   Patient temperature 12/06/2020 37.0   Final   Collection site 12/06/2020 REVIEWED BY  Final   Sample type 12/06/2020 ARTERIAL DRAW   Final   Performed at [facility], [place], [place] [id number]   Salicylate Lvl [id number] <7.0 (L)  7.0 - 30.0 mg/dL Final   Performed at [facility], [place], [place] [address]   Acetaminophen (Tylenol), Serum 12/06/2020 <10 (L)  10 - 30 ug/mL Final   Comment: (NOTE) Therapeutic concentrations vary significantly. A range of 10-30 ug/mL  may be an effective concentration for many patients. However, some  are best treated at concentrations outside of this range. Acetaminophen concentrations >150 ug/mL at 4 hours after ingestion  and >50 ug/mL at 12 hours after ingestion are often associated with  toxic reactions.  Performed at [facility], [address] [place], [place] [address]    Glucose-Capillary 12/06/2020 171 (H)   70 - 99 mg/dL Final   Glucose reference range applies only to samples taken after fasting for at least 8 hours.   Sodium 12/07/2020 137  135 - 145 mmol/L Final   Potassium 12/07/2020 4.0  3.5 - 5.1 mmol/L Final   Chloride 12/07/2020 102  98 - 111 mmol/L Final   CO2 12/07/2020 30  22 - 32 mmol/L Final   Glucose, Bld 12/07/2020 119 (H)  70 - 99 mg/dL Final   Glucose reference range applies only to samples taken after fasting for at least 8 hours.   BUN 12/07/2020 15  6 - 20 mg/dL Final   Creatinine, Ser 12/07/2020 1.46 (H)  0.61 - 1.24 mg/dL Final   Calcium 12/07/2020 8.9  8.9 - 10.3 mg/dL Final   GFR, Estimated 12/07/2020 >60  >60 mL/min Final   Comment: (NOTE) Calculated using the CKD-EPI Creatinine Equation (2021)    Anion gap 12/07/2020 5  5 - 15 Final   Performed at [facility], [address] [place], [place] [address]   WBC 12/07/2020 12.6 (H)  4.0 - 10.5 K/uL Final   RBC 12/07/2020 5.24  4.22 - 5.81 MIL/uL Final   Hemoglobin 12/07/2020 15.3  13.0 - 17.0 g/dL Final   HCT 12/07/2020 45.1  39.0 - 52.0 % Final   MCV 12/07/2020 86.1  80.0 - 100.0 fL Final   MCH 12/07/2020 29.2  26.0 - 34.0 pg Final   MCHC 12/07/2020 33.9  30.0 - 36.0 g/dL Final   RDW 12/07/2020 12.5  11.5 - 15.5 % Final   Platelets 12/07/2020 223  150 - 400 K/uL Final   nRBC 12/07/2020 0.0  0.0 - 0.2 % Final   Performed at [facility], [address] [place], [place] [address]   Magnesium 12/07/2020 2.5 (H)  1.7 - 2.4 mg/dL Final   Performed at [facility], [address] [place], [place] [address]   Phosphorus 12/07/2020 4.6  2.5 - 4.6 mg/dL Final   Performed at [facility], [address] [place], [place] [address]   Magnesium 12/06/2020 2.8 (H)  1.7 - 2.4 mg/dL Final   Performed at [facility], [address] [place], [place] [address]   Phosphorus 12/06/2020 6.3 (H)  2.5 - 4.6 mg/dL Final   Performed at [facility], [place], [place] [address]   Troponin I (High Sensitivity) 12/06/2020 8  <18 ng/L Final   Comment: (NOTE) Elevated high sensitivity troponin I (hsTnI) values and significant  changes across serial measurements may suggest ACS but many other  chronic and acute conditions are known to elevate hsTnI  results.  Refer to the "Links" section for chest pain algorithms and additional  guidance. Performed at [facility], [address] [place], [place] [address]    HIV Screen 4th Generation wRfx 12/07/2020 Non Reactive  Non Reactive Final   Performed at [facility] [address] [place], [place] [address]   MRSA by PCR Next Gen 12/07/2020 NOT DETECTED  NOT DETECTED Final   Comment: (NOTE) The GeneXpert MRSA Assay (FDA approved for NASAL specimens only), is one component of a comprehensive MRSA colonization surveillance program. It is not intended to diagnose MRSA infection nor to guide or monitor treatment for MRSA infections. Test performance is not FDA approved in patients less than 53 years old. Performed at [facility], [address] [place], [place] [address]    Lactic Acid, Venous 12/07/2020 1.1  0.5 - 1.9 mmol/L Final   Performed at [facility], [address] [place], [place] [address]   Hemoglobin 12/07/2020 14.7  13.0 - 17.0 g/dL Final   HCT 12/07/2020 42.9  39.0 - 52.0 % Final   Performed at [facility], [address] [place], [place] [address]   Total CK 12/07/2020 360  49 - 397 U/L Final   Performed at [facility], [address] [place], [place] [address]   Glucose-Capillary 12/07/2020 89  70 - 99 mg/dL Final   Glucose reference range applies only to samples taken after fasting for at least 8 hours.   Glucose-Capillary 12/07/2020 56 (L)  70 - 99 mg/dL Final   Glucose reference range applies only to samples taken after fasting for at least 8 hours.   Glucose-Capillary 12/07/2020 77  70 - 99 mg/dL Final   Glucose  reference range applies only to samples taken after fasting for at least 8 hours.   Glucose-Capillary 12/07/2020 54 (L)  70 - 99 mg/dL Final   Glucose reference range applies only to samples taken after fasting for at least 8 hours.   WBC 12/08/2020 4.1  4.0 - 10.5 K/uL Final   RBC 12/08/2020 4.71  4.22 - 5.81 MIL/uL Final   Hemoglobin 12/08/2020 14.1  13.0 - 17.0 g/dL Final   HCT 12/08/2020 41.9  39.0 - 52.0 % Final   MCV 12/08/2020 89.0  80.0 - 100.0 fL Final   MCH 12/08/2020 29.9  26.0 - 34.0 pg Final   MCHC 12/08/2020 33.7  30.0 - 36.0 g/dL Final   RDW 12/08/2020 12.9  11.5 - 15.5 % Final   Platelets 12/08/2020 183  150 - 400 K/uL Final   nRBC 12/08/2020 0.0  0.0 - 0.2 % Final   Performed at [facility], [address] [place], [place] [address]   Sodium 12/08/2020 139  135 - 145 mmol/L Final   Potassium 12/08/2020 3.8  3.5 - 5.1 mmol/L Final   Chloride 12/08/2020 105  98 - 111 mmol/L Final   CO2 12/08/2020 29  22 - 32 mmol/L Final   Glucose, Bld 12/08/2020 106 (H)  70 - 99 mg/dL Final   Glucose reference range applies only to samples taken after fasting for at least 8 hours.   BUN 12/08/2020 12  6 - 20 mg/dL Final   Creatinine, Ser 12/08/2020 1.41 (H)  0.61 - 1.24 mg/dL Final   Calcium 12/08/2020 8.3 (L)  8.9 - 10.3 mg/dL Final   GFR, Estimated 12/08/2020 >60  >60 mL/min Final   Comment: (NOTE) Calculated using the CKD-EPI Creatinine Equation (2021)  Anion gap 12/08/2020 5  5 - 15 Final   Performed at [facility], [address] [place], [place] [address]   Magnesium 12/08/2020 2.2  1.7 - 2.4 mg/dL Final   Performed at [facility], [address] [place], [place] [address]   Phosphorus 12/08/2020 4.4  2.5 - 4.6 mg/dL Final   Performed at [facility], [address] [place], [place] [address]   Glucose-Capillary 12/07/2020 123 (H)  70 - 99 mg/dL Final   Glucose reference range applies only to samples taken after fasting for at  least 8 hours.   Glucose-Capillary 12/08/2020 109 (H)  70 - 99 mg/dL Final   Glucose reference range applies only to samples taken after fasting for at least 8 hours.   Glucose-Capillary 12/08/2020 73  70 - 99 mg/dL Final   Glucose reference range applies only to samples taken after fasting for at least 8 hours.   Glucose-Capillary 12/08/2020 98  70 - 99 mg/dL Final   Glucose reference range applies only to samples taken after fasting for at least 8 hours.   Glucose-Capillary 12/08/2020 92  70 - 99 mg/dL Final   Glucose reference range applies only to samples taken after fasting for at least 8 hours.   Glucose-Capillary 12/08/2020 103 (H)  70 - 99 mg/dL Final   Glucose reference range applies only to samples taken after fasting for at least 8 hours.   Glucose-Capillary 12/08/2020 112 (H)  70 - 99 mg/dL Final   Glucose reference range applies only to samples taken after fasting for at least 8 hours.   Glucose-Capillary 12/09/2020 98  70 - 99 mg/dL Final   Glucose reference range applies only to samples taken after fasting for at least 8 hours.   WBC 12/09/2020 5.3  4.0 - 10.5 K/uL Final   RBC 12/09/2020 5.20  4.22 - 5.81 MIL/uL Final   Hemoglobin 12/09/2020 15.7  13.0 - 17.0 g/dL Final   HCT 91/47/829510/19/2022 45.9  39.0 - 52.0 % Final   MCV 12/09/2020 88.3  80.0 - 100.0 fL Final   MCH 12/09/2020 30.2  26.0 - 34.0 pg Final   MCHC 12/09/2020 34.2  30.0 - 36.0 g/dL Final   RDW 62/13/086510/19/2022 12.8  11.5 - 15.5 % Final   Platelets 12/09/2020 189  150 - 400 K/uL Final   nRBC 12/09/2020 0.0  0.0 - 0.2 % Final   Neutrophils Relative % 12/09/2020 65  % Final   Neutro Abs 12/09/2020 3.4  1.7 - 7.7 K/uL Final   Lymphocytes Relative 12/09/2020 23  % Final   Lymphs Abs 12/09/2020 1.2  0.7 - 4.0 K/uL Final   Monocytes Relative 12/09/2020 11  % Final   Monocytes Absolute 12/09/2020 0.6  0.1 - 1.0 K/uL Final   Eosinophils Relative 12/09/2020 1  % Final   Eosinophils Absolute 12/09/2020 0.0  0.0 - 0.5 K/uL Final    Basophils Relative 12/09/2020 0  % Final   Basophils Absolute 12/09/2020 0.0  0.0 - 0.1 K/uL Final   Immature Granulocytes 12/09/2020 0  % Final   Abs Immature Granulocytes 12/09/2020 0.02  0.00 - 0.07 K/uL Final   Performed at [facility], [address] [place], [place] [address]   Sodium 12/09/2020 140  135 - 145 mmol/L Final   Potassium 12/09/2020 4.0  3.5 - 5.1 mmol/L Final   Chloride 12/09/2020 104  98 - 111 mmol/L Final   CO2 12/09/2020 28  22 - 32 mmol/L Final  Glucose, Bld 12/09/2020 94  70 - 99 mg/dL Final   Glucose reference range applies only to samples taken after fasting for at least 8 hours.   BUN 12/09/2020 9  6 - 20 mg/dL Final   Creatinine, Ser 12/09/2020 1.07  0.61 - 1.24 mg/dL Final   Calcium 12/09/2020 9.1  8.9 - 10.3 mg/dL Final   Total Protein 12/09/2020 6.8  6.5 - 8.1 g/dL Final   Albumin 12/09/2020 3.6  3.5 - 5.0 g/dL Final   AST 12/09/2020 90 (H)  15 - 41 U/L Final   ALT 12/09/2020 37  0 - 44 U/L Final   Alkaline Phosphatase 12/09/2020 62  38 - 126 U/L Final   Total Bilirubin 12/09/2020 0.7  0.3 - 1.2 mg/dL Final   GFR, Estimated 12/09/2020 >60  >60 mL/min Final   Comment: (NOTE) Calculated using the CKD-EPI Creatinine Equation (2021)    Anion gap 12/09/2020 8  5 - 15 Final   Performed at [facility], [place], [place] [address]   Magnesium 12/09/2020 2.0  1.7 - 2.4 mg/dL Final   Performed at [facility], [address] [place], [place] [address]   Phosphorus 12/09/2020 2.9  2.5 - 4.6 mg/dL Final   Performed at [facility], [address] [place],  [address]   Glucose-Capillary 12/09/2020 93  70 - 99 mg/dL Final   Glucose reference range applies only to samples taken after fasting for at least 8 hours.    Allergies: Patient has no known allergies.  PTA Medications:  Prior to Admission medications   Medication Sig Start Date End Date Taking? Authorizing Provider  benztropine  (COGENTIN) 0.5 MG tablet Take 1 tablet (0.5 mg total) by mouth daily. 03/17/21   Starkes-Perry, Gayland Curry, FNP  FLUoxetine (PROZAC) 20 MG capsule Take 1 capsule (20 mg total) by mouth daily. 03/17/21   Starkes-Perry, Gayland Curry, FNP  OLANZapine (ZYPREXA) 15 MG tablet Take 1 tablet (15 mg total) by mouth at bedtime. Patient taking differently: Take 15 mg by mouth daily. 03/17/21   Suella Broad, FNP     Medical Decision Making  Admit to continuous assessment for crisis stabilization  Continue home medications  Scheduled Meds:  [START ON 04/08/2021] benztropine  0.5 mg Oral Daily   [START ON 04/08/2021] FLUoxetine  20 mg Oral Daily   OLANZapine  15 mg Oral QHS   PRN Meds:.acetaminophen, alum & mag hydroxide-simeth, hydrOXYzine, magnesium hydroxide   Lab Orders         Resp Panel by RT-PCR (Flu A&B, Covid) Nasopharyngeal Swab         CBC with Differential/Platelet         Comprehensive metabolic panel         Hemoglobin A1c         Ethanol         Lipid panel         TSH         POC SARS Coronavirus 2 Ag-ED - Nasal Swab         POCT Urine Drug Screen - (ICup)         POC SARS Coronavirus 2 Ag           Recommendations  Based on my evaluation the patient does not appear to have an emergency medical condition.  Rozetta Nunnery, NP 04/07/21  5:19 AM

## 2021-04-07 NOTE — ED Notes (Signed)
Pt had breakfast and laid back down

## 2021-04-07 NOTE — ED Notes (Addendum)
Pt A&O x 4, presents with auditory hallucinations, paranoia and request to be restarted on home medications.  Denies SI, HI or visual hallucinations.  Pt calm & cooperative.  Monitoring for safety.  No distress noted.

## 2021-04-07 NOTE — ED Notes (Signed)
Pt sleeping@this time. Breathing even and unlabored. Will continue to monitor for safety 

## 2021-04-07 NOTE — H&P (Addendum)
Behavioral Health Medical Screening Exam  Visit Diagnoses:   -Schizophreniform disorder (Bradley Casey)  -Cocaine use disorder ([place])  Bradley Casey is a 31 y.o. male with documented past psychiatric history significant for schizophreniform disorder, psychosis, cocaine abuse, cocaine-induced psychotic disorder with hallucinations, polysubstance abuse, and no significant past medical history, who presents to the [facility]) unaccompanied as a voluntary walk-in for HI and auditory hallucinations.  Patient reports that he is currently living at [facility] in [place] and that one of the [facility] employees dropped him off at [facility].  Patient states that he asked this sober [facility] employee to drive him to [facility] because "I was having some bad thoughts and some things were overwhelming".  When patient is asked to provide further details regarding these "bad thoughts", patient states "more like homicidal thoughts. I'm not saying I'm suicidal, but I guess you can mix a little bit of that in there too".  Patient endorses passive SI currently on exam without plan or intent.  Patient denies history of any previous suicide attempts.  He denies history of self-injurious behavior via intentionally cutting or burning himself.  Patient endorses active HI with intent, but without plan.  Patient initially reports that his HI is not directed towards any specific individual.  When patient is asked to provide further details regarding his HI, he reports that some of his roommates at [facility] in [place] will occasionally "move funny, act funny", which he states will make him irritated.  When patient is asked if his HI is directed towards his roommates at [facility], patient states that sometimes he has thoughts that he should hurt them before they hurt him and also states "when certain things come together, the only thing to get me out of it would be to  hurt somebody", but he does not provide further details regarding his HI.  He denies being a part of any verbal or physical altercations between him and his [facility] roommates.  Patient denies AVH currently on exam.  He reports having intermittent auditory hallucinations (occasionally command auditory hallucinations as well), but the patient is unable to recall how often he experiences them.  He describes his auditory hallucinations as occasionally hearing a single male voice whispering, that is different from the patient's own voice.  Patient also states that this voice will occasionally tell him to hurt other people before they hurt him, but patient does not provide further details regarding this statement.  Patient also states that occasionally while he is in the kitchen at his current living situation at [facility] [place], he will hear this voice tell him to hurt his roommates with a knife before they hurt him. Patient does report that he will occasionally hear his own voice in his head, but he states that this is his own internal dialogue and that this is not auditory hallucinations.  He reports that he last experienced these auditory hallucinations earlier today on 04/06/2021.  He denies visual hallucinations.  Patient also endorses intermittent episodes of paranoia, but patient does not provide details regarding if his paranoia is directed towards a particular person.  Patient describes his sleep as good, about 8 hours per night.  He reports that his appetite has been increased over the past few months and he reports that he has gained about 15 pounds over the past few months.  Per chart review, patient was psychiatrically hospitalized at [facility] from  01/29/2021 to 02/03/2021 and was discharged on Zyprexa 15 mg p.o. at bedtime, Cogentin 0.5 mg p.o. daily, and Prozac 20 mg p.o. daily.  Chart review also shows that after patient's psychiatric hospitalization discharged on 02/03/2021, patient  presented to [facility] emergency department on 02/17/2021 for psychiatric evaluation, specifically paranoia, command auditory hallucinations, and thoughts of harming other people.  Per chart review, patient was followed by psychiatry in the emergency department was ultimately psychiatrically cleared on 02/19/2021 and discharged from the ED on 02/19/2021 with plan to follow up at [place] [facility].  Patient then presented back to [facility] ED on 03/09/2021 for detox from cocaine, was evaluated by psychiatry and was ultimately psychiatrically cleared and discharged from the ED on 03/09/2021 with plan to go to a detox facility.  [facility] then presented to [facility] on 03/16/2021 for psychiatric evaluation and med refill.  Patient was evaluated by TTS on 03/16/2021, was observed in the emergency department overnight, and was ultimately psychiatrically cleared on 03/17/2028 and discharged from the ED on 03/17/2021 with prescriptions for Zyprexa 15 mg p.o. at bedtime, Cogentin 0.5 mg p.o. daily, and Prozac 20 mg p.o. daily, and with instructions to follow up with [facility] in [facility].  Chart review also shows that patient was also psychiatrically hospitalized at [facility] from 01/21/2019 to 01/25/2019 as well.  Patient denies history of any additional inpatient psychiatric hospitalizations  Patient reports that he does not have an outpatient psychiatric provider or therapist at this time.  Patient reports that his current psychotropic medication regimen consists of Zyprexa 15 mg p.o. daily, Cogentin 0.5 mg p.o. daily, and Prozac 20 mg p.o. daily.  Patient reports that he takes these 3 prescriptions as prescribed and states that he typically takes all 3 of these medications in the morning before he goes to sleep due to him working third shift.  Patient's home prescription medication bottles were retrieved from patient's locked belongings and were personally reviewed by this provider with the patient.  Upon review of the patient's  home medication prescription bottles with the patient, it was confirmed that patient has prescriptions for Zyprexa 15 mg p.o. daily that was filled on 03/19/2021, Cogentin 0.5 mg p.o. daily that was filled on 03/19/2021, and Prozac 20 mg p.o. daily that was filled on 03/19/2021.  After review of patient's prescriptions with the patient, patient's prescription bottles were returned to patient's locked belongings and locked up with the rest of patient's belongings without issue. Patient reports that these medications are prescribed by his provider at [facility]).  Patient reports that he has not taken his medications since the morning of Monday, 04/05/2021.  Patient denies taking any additional home medications at this time.  Patient reports that he relapsed on cocaine (about 0.5 g worth) a few days ago and denies any cocaine use since this relapse.  He reports that prior to this relapse a few days ago, he had been completely sober from cocaine since December 2022 and he states that prior to this period of sobriety, he had been using 0.5 to 1 g of cocaine via intranasal route.  Patient reports that he has not consumed any alcohol since December 2022.  Patient reports that prior to December 2022, he was drinking about 2 25 ounce cans of beer 3-4 times per week.  He denies history of alcohol-related withdrawal symptoms or seizures.  Patient also reports that he has not used any marijuana since December 2022.  He reports that prior to December 2022, he  was using marijuana a few times per week.  Patient denies current or recent use of any other substances.  Patient reports that he is currently living at [facility] in [facility] and states that he has been living there for the past month.  Patient reports that sober living is willing to accept him back at their facility upon future discharge.  He reports that prior to him living at [facility], he was homeless and  intermittently living with "friends and females".  Patient reports that he was incarcerated about 2 years ago for a fraud charge and a charge for possession of cocaine.  Patient denies any current pending legal charges.  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  Patient reports that he is not currently in school at this time.  He reports that his highest level of education is partial college completion.  Patient is unable to contract for safety at this time.  On exam, patient is sitting upright, fairly groomed, in no acute distress.  Eye contact is good.  Speech is clear and coherent with normal rate and volume.  Mood is anxious with mood congruent affect.  Thought process is coherent, goal directed, and linear.  Patient is alert and oriented x4, pleasant, cooperative, and answers all questions appropriately during the evaluation.  No indication that patient is responding to internal/external stimuli.  No delusional thought content noted.  Total Time spent with patient: 30 minutes  Psychiatric Specialty Exam:  Presentation  General Appearance: Appropriate for Environment; Fairly Groomed  Eye Contact:Good  Speech:Clear and Coherent; Normal Rate  Speech Volume:Normal  Handedness:Right   Mood and Affect  Mood:Anxious  Affect:Congruent   Thought Process  Thought Processes:Coherent; Goal Directed; Linear  Descriptions of Associations:Intact  Orientation:Full (Time, Place and Person)  Thought Content:Logical; Paranoid Ideation  History of Schizophrenia/Schizoaffective disorder:No (Previous diagnosis of Schizophreniform disorder)  Duration of Psychotic Symptoms:Greater than six months  Hallucinations:Hallucinations: -- (Patient denies AVH currently on exam, but endorses having recent AH/command AH (see HPI for details).)  Ideas of Reference:Paranoia  Suicidal Thoughts:Suicidal Thoughts: Yes, Passive SI Passive  Intent and/or Plan: Without Intent; Without Plan; Without Means to Carry Out; With Access to Means  Homicidal Thoughts:Homicidal Thoughts: Yes, Active (See HPI for details.) HI Active Intent and/or Plan: With Intent; Without Plan; With Means to Carry Out; Without Access to Means   Sensorium  Memory:Immediate Good; Recent Good; Remote Good  Judgment:Fair  Insight:Fair   Executive Functions  Concentration:Good  Attention Span:Good  [place]  Language:Good   Psychomotor Activity  Psychomotor Activity:Psychomotor Activity: Normal   Assets  Assets:Communication Skills; Desire for Improvement; Housing; Leisure Time; Physical Health; Resilience; Vocational/Educational   Sleep  Sleep:Sleep: Good    Physical Exam: Physical Exam Vitals reviewed.  Constitutional:      General: He is not in acute distress.    Appearance: He is not ill-appearing, toxic-appearing or diaphoretic.  HENT:     Head: Normocephalic and atraumatic.     Comments: Single cosmetic piercing noted on patient's right cheek.    Right Ear: External ear normal.     Left Ear: External ear normal.     Nose: Nose normal.  Eyes:     General:        Right eye: No discharge.        Left eye: No discharge.     Conjunctiva/sclera: Conjunctivae normal.  Cardiovascular:  Rate and Rhythm: Normal rate.  Pulmonary:     Effort: Pulmonary effort is normal. No respiratory distress.  Musculoskeletal:        General: Normal range of motion.     Cervical back: Normal range of motion.  Skin:    Comments: Tattoos noted on patient's bilateral forearms, bilateral hands, neck, and face.  Neurological:     General: No focal deficit present.     Mental Status: He is alert and oriented to person, place, and time.     Comments: No tremor noted.  Psychiatric:        Attention and Perception: Attention and perception normal.        Mood and Affect: Mood is anxious.        Speech: Speech  normal.        Behavior: Behavior is not agitated, slowed, aggressive, withdrawn, hyperactive or combative. Behavior is cooperative.        Thought Content: Thought content is paranoid. Thought content is not delusional. Thought content includes homicidal and suicidal ideation. Thought content does not include homicidal or suicidal plan.     Comments: Affect mood congruent.  Patient denies AVH currently on exam, but endorses having recent AH/command (see HPI for details).   Review of Systems  Constitutional:  Positive for malaise/fatigue. Negative for chills, diaphoresis, fever and weight loss.       + for weight gain.  HENT:  Negative for congestion.   Respiratory:  Negative for cough and shortness of breath.   Cardiovascular:  Negative for chest pain and palpitations.  Gastrointestinal:  Negative for abdominal pain, constipation, diarrhea, nausea and vomiting.  Musculoskeletal:  Negative for joint pain and myalgias.  Neurological:  Negative for dizziness, tremors, seizures and headaches.  Psychiatric/Behavioral:  Positive for hallucinations, substance abuse and suicidal ideas. Negative for depression and memory loss. The patient is nervous/anxious. The patient does not have insomnia.   All other systems reviewed and are negative.  Vitals: Blood pressure 139/80, pulse 85, temperature 99 F (37.2 C), temperature source Oral, SpO2 98 %. There is no height or weight on file to calculate BMI.  Musculoskeletal: Strength & Muscle Tone: within normal limits Gait & Station: normal Patient leans: N/A   Recommendations:  Based on my evaluation the patient does not appear to have an emergency medical condition.  Based on my evaluation of the patient and patient's current presentation, including HI with intent, command auditory hallucinations telling the patient to harm others, and patient's inability to contract for safety at this time, the patient appears to be a potential danger to himself and  others at this time and thus, recommend [facility] behavioral health urgent care [facility]) continuous assessment for the patient at this time.  Patient is agreeable to transfer to [facility] for continuous assessment.  Will transfer the patient to [facility] for continuous assessment with psychiatry reevaluation on 04/07/2021.  Provider report given to [facility] provider Lindon Romp, NP) and [facility], who has agreed to accept the patient.  EMTALA form completed.  Due to patient reportedly not taking his home medications since the morning of Monday, 04/05/2021, Will place orders for patient to receive one-time doses of his home medications of Zyprexa 15 mg, Cogentin 0.5 mg, and Prozac 20 mg upon his arrival to the [facility].  Will also place orders for patient to continue his home medications of Cogentin 0.5 mg p.o. daily and Prozac 20 mg p.o. daily, with the scheduled medications to resume on the morning of  Thursday, 04/08/2021 due to patient receiving one-time doses of these medications upon his arrival to the [facility] this morning.  Furthermore, due to patient reportedly taking this medication in the mornings after working third shift before he goes to sleep, will order patient's home medication of Zyprexa 15 mg p.o. to be scheduled at the [facility] for daily at bedtime after he receives one-time dose of Zyprexa 15 mg upon his arrival to the [facility] this morning.   Will also place order to initiate hydroxyzine 25 mg p.o. 3 times daily as needed for anxiety at the [facility].  Patient educated on side effect profile of hydroxyzine.  Prescilla Sours, PA-C 04/07/2021, 4:17 AM

## 2021-04-07 NOTE — ED Notes (Signed)
Pt provided breakfast. Denies SI, HI, pain, and AVH at this time.  Breathing is even and unlabored.  Reports he slept ok through the night.  Will continue to monitor for safety.

## 2021-04-07 NOTE — ED Provider Notes (Signed)
Behavioral Health Progress Note  Date and Time: 04/07/2021 12:49 PM Name: Bradley Casey MRN:  [id number]  Subjective:   Bradley Casey is a 31 y.o. male with documented past psychiatric history significant for schizophreniform disorder, psychosis, cocaine abuse, cocaine-induced psychotic disorder with hallucinations, polysubstance abuse, and no significant past medical history, who presents to the [facility]) unaccompanied as a voluntary walk-in for HI and auditory hallucinations. He was transferred to [facility] on 04/07/21 for continues observation and restarted on his medications. UDS+buprenorphine and cocaine.   Patient seen and chart reviewed-patient has been medication compliant.  Patient seen this afternoon, patient reports ongoing paranoia and passive suicidal thoughts.  He is unable to contract for safety. He is unable/unwilling to expand upon his reported paranoia although expresses that he does not feel safe He reports sleeping well and denies issues with appetite.  He denies AVH although does admit that he has experienced hallucinations in the past for which he takes medication. He reports ongoing HI without plan or intent.  Patient reports that he takes Prozac, Zyprexa and Cogentin prescribed by his outpatient provider. He states that he is generally medication compliant although does admit to occasionally missing doses. Patient is unable to contract for safety at this time and alludes to paranoia as being the reason. Discussed with patient that inpatient admission is recommended due to psychotic sx and patient is amenable.     Diagnosis:  Final diagnoses:  Schizophreniform disorder ([place])  Cocaine use disorder ([place])    Total Time spent with patient: 15 minutes  Past Psychiatric History: schizophreniform, psychosis, cocaine abuse, cocaine-induced psychotic disorder with hallucinations, polysubstance abuse Past Medical History:  Past Medical History:  Diagnosis  Date   Schizophrenia ([place])    No past surgical history on file. Family History:  Family History  Family history unknown: Yes   Family Psychiatric  History: none reported Social History:  Social History   Substance and Sexual Activity  Alcohol Use Not Currently     Social History   Substance and Sexual Activity  Drug Use Not Currently   Types: Marijuana, Cocaine    Social History   Socioeconomic History   Marital status: Single    Spouse name: Not on file   Number of children: Not on file   Years of education: Not on file   Highest education level: Not on file  Occupational History   Not on file  Tobacco Use   Smoking status: Some Days    Types: Cigars   Smokeless tobacco: Never  Vaping Use   Vaping Use: Some days   Substances: Nicotine, Flavoring  Substance and Sexual Activity   Alcohol use: Not Currently   Drug use: Not Currently    Types: Marijuana, Cocaine   Sexual activity: Yes  Other Topics Concern   Not on file  Social History Narrative   Not on file   Social Determinants of Health   Financial Resource Strain: Not on file  Food Insecurity: Not on file  Transportation Needs: Not on file  Physical Activity: Not on file  Stress: Not on file  Social Connections: Not on file   SDOH:  SDOH Screenings   Alcohol Screen: Low Risk    Last Alcohol Screening Score (AUDIT): 1  Depression (PHQ2-9): Medium Risk   PHQ-2 Score: 6  Financial Resource Strain: Not on file  Food Insecurity: Not on file  Housing: Not on file  Physical Activity: Not on file  Social Connections: Not on file  Stress: Not on file  Tobacco Use: High Risk   Smoking Tobacco Use: Some Days   Smokeless Tobacco Use: Never   Passive Exposure: Not on file  Transportation Needs: Not on file   Additional Social History:    Pain Medications: See MAR Prescriptions: See MAR Over the Counter: See MAR History of alcohol / drug use?: Yes Longest period of sobriety (when/how long): Unsure;  pt was recently sober a minimum of 10 days 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) 1 - Duration: Unknown 1 - Last Use / Amount: 04/02/2021 1 - Method of Aquiring: Unknown 1- Route of Use: Snorting Name of Substance 2: Marijuana 2 - Age of First Use: [age] 2 - Amount (size/oz): Unsure 2 - Frequency: Daily 2 - Duration: UTA 2 - Last Use / Amount: Pt reports he last used several days after Christmas, though he previously stated his last date of use was 03/08/21 2 - Method of Aquiring: Unknown 2 - Route of Substance Use: Smoke                Sleep: Fair  Appetite:  Fair  Current Medications:  Current Facility-Administered Medications  Medication Dose Route Frequency Provider Last Rate Last Admin   acetaminophen (TYLENOL) tablet 650 mg  650 mg Oral Q6H PRN Prescilla Sours, PA-C       alum & mag hydroxide-simeth (MAALOX/MYLANTA) 200-200-20 MG/5ML suspension 30 mL  30 mL Oral Q4H PRN Prescilla Sours, PA-C       [START ON 04/08/2021] benztropine (COGENTIN) tablet 0.5 mg  0.5 mg Oral Daily Margorie John W, PA-C       [START ON 04/08/2021] FLUoxetine (PROZAC) capsule 20 mg  20 mg Oral Daily Lovena Le, Cody W, PA-C       hydrOXYzine (ATARAX) tablet 25 mg  25 mg Oral TID PRN Prescilla Sours, PA-C   25 mg at 04/07/21 0320   magnesium hydroxide (MILK OF MAGNESIA) suspension 30 mL  30 mL Oral Daily PRN Margorie John W, PA-C       OLANZapine (ZYPREXA) tablet 15 mg  15 mg Oral QHS Prescilla Sours, PA-C       Current Outpatient Medications  Medication Sig Dispense Refill   benztropine (COGENTIN) 0.5 MG tablet Take 1 tablet (0.5 mg total) by mouth daily. 30 tablet 1   FLUoxetine (PROZAC) 20 MG capsule Take 1 capsule (20 mg total) by mouth daily. 30 capsule 1   OLANZapine  (ZYPREXA) 15 MG tablet Take 1 tablet (15 mg total) by mouth at bedtime. (Patient taking differently: Take 15 mg by mouth daily.) 30 tablet 1    Labs  Lab Results:  Admission on 04/07/2021  Component Date Value Ref Range Status   SARS Coronavirus 2 by RT PCR 04/07/2021 NEGATIVE  NEGATIVE Final   Comment: (NOTE) SARS-CoV-2 target nucleic acids are NOT DETECTED.  The SARS-CoV-2 RNA is generally detectable in upper respiratory specimens during the acute phase of infection. The lowest concentration of SARS-CoV-2 viral copies this assay can detect is 138 copies/mL. A negative result does not preclude SARS-Cov-2 infection and should not be used as the sole basis for treatment or other patient management decisions. A negative result may occur with  improper specimen collection/handling, submission of specimen other  than nasopharyngeal swab, presence of viral mutation(s) within the areas targeted by this assay, and inadequate number of viral copies(<138 copies/mL). A negative result must be combined with clinical observations, patient history, and epidemiological information. The expected result is Negative.  Fact Sheet for Patients:  EntrepreneurPulse.com.au  Fact Sheet for Healthcare Providers:  IncredibleEmployment.be  This test is no                          t yet approved or cleared by the [place] FDA and  has been authorized for detection and/or diagnosis of SARS-CoV-2 by FDA under an Emergency Use Authorization (EUA). This EUA will remain  in effect (meaning this test can be used) for the duration of the COVID-19 declaration under Section 564(b)(1) of the Act, 21 U.S.C.section 360bbb-3(b)(1), unless the authorization is terminated  or revoked sooner.       Influenza A by PCR 04/07/2021 NEGATIVE  NEGATIVE Final   Influenza B by PCR 04/07/2021 NEGATIVE  NEGATIVE Final   Comment: (NOTE) The Xpert Xpress SARS-CoV-2/FLU/RSV plus assay is  intended as an aid in the diagnosis of influenza from Nasopharyngeal swab specimens and should not be used as a sole basis for treatment. Nasal washings and aspirates are unacceptable for Xpert Xpress SARS-CoV-2/FLU/RSV testing.  Fact Sheet for Patients: EntrepreneurPulse.com.au  Fact Sheet for Healthcare Providers: IncredibleEmployment.be  This test is not yet approved or cleared by the [place] FDA and has been authorized for detection and/or diagnosis of SARS-CoV-2 by FDA under an Emergency Use Authorization (EUA). This EUA will remain in effect (meaning this test can be used) for the duration of the COVID-19 declaration under Section 564(b)(1) of the Act, 21 U.S.C. section 360bbb-3(b)(1), unless the authorization is terminated or revoked.  Performed at [facility], [place] [address], [place], [place] [address]    SARS Coronavirus 2 Ag 04/07/2021 Negative  Negative Preliminary   WBC 04/07/2021 5.0  4.0 - 10.5 K/uL Final   RBC 04/07/2021 5.06  4.22 - 5.81 MIL/uL Final   Hemoglobin 04/07/2021 15.1  13.0 - 17.0 g/dL Final   HCT 04/07/2021 44.8  39.0 - 52.0 % Final   MCV 04/07/2021 88.5  80.0 - 100.0 fL Final   MCH 04/07/2021 29.8  26.0 - 34.0 pg Final   MCHC 04/07/2021 33.7  30.0 - 36.0 g/dL Final   RDW 04/07/2021 12.5  11.5 - 15.5 % Final   Platelets 04/07/2021 228  150 - 400 K/uL Final   nRBC 04/07/2021 0.0  0.0 - 0.2 % Final   Neutrophils Relative % 04/07/2021 41  % Final   Neutro Abs 04/07/2021 2.0  1.7 - 7.7 K/uL Final   Lymphocytes Relative 04/07/2021 48  % Final   Lymphs Abs 04/07/2021 2.3  0.7 - 4.0 K/uL Final   Monocytes Relative 04/07/2021 10  % Final   Monocytes Absolute 04/07/2021 0.5  0.1 - 1.0 K/uL Final   Eosinophils Relative 04/07/2021 1  % Final   Eosinophils Absolute 04/07/2021 0.1  0.0 - 0.5 K/uL Final   Basophils Relative 04/07/2021 0  % Final   Basophils Absolute 04/07/2021 0.0  0.0 - 0.1 K/uL Final    Immature Granulocytes 04/07/2021 0  % Final   Abs Immature Granulocytes 04/07/2021 0.02  0.00 - 0.07 K/uL Final   Performed at [facility], [place] [address], [place], [place] [address]   Sodium 04/07/2021 137  135 - 145 mmol/L Final   Potassium  04/07/2021 4.2  3.5 - 5.1 mmol/L Final   Chloride 04/07/2021 98  98 - 111 mmol/L Final   CO2 04/07/2021 30  22 - 32 mmol/L Final   Glucose, Bld 04/07/2021 103 (H)  70 - 99 mg/dL Final   Glucose reference range applies only to samples taken after fasting for at least 8 hours.   BUN 04/07/2021 8  6 - 20 mg/dL Final   Creatinine, Ser 04/07/2021 1.32 (H)  0.61 - 1.24 mg/dL Final   Calcium 04/07/2021 9.7  8.9 - 10.3 mg/dL Final   Total Protein 04/07/2021 6.8  6.5 - 8.1 g/dL Final   Albumin 04/07/2021 4.1  3.5 - 5.0 g/dL Final   AST 04/07/2021 25  15 - 41 U/L Final   ALT 04/07/2021 25  0 - 44 U/L Final   Alkaline Phosphatase 04/07/2021 79  38 - 126 U/L Final   Total Bilirubin 04/07/2021 0.3  0.3 - 1.2 mg/dL Final   GFR, Estimated 04/07/2021 >60  >60 mL/min Final   Comment: (NOTE) Calculated using the CKD-EPI Creatinine Equation (2021)    Anion gap 04/07/2021 9  5 - 15 Final   Performed at [facility], [place] [address], [place], [place] [address]   Hgb A1c MFr Bld 04/07/2021 5.8 (H)  4.8 - 5.6 % Final   Comment: (NOTE) Pre diabetes:          5.7%-6.4%  Diabetes:              >6.4%  Glycemic control for   <7.0% adults with diabetes    Mean Plasma Glucose 04/07/2021 119.76  mg/dL Final   Performed at [facility], [place] [address] [place], [place] [address]   Alcohol, Ethyl (B) 04/07/2021 <10  <10 mg/dL Final   Comment: (NOTE) Lowest detectable limit for serum alcohol is 10 mg/dL.  For medical purposes only. Performed at [facility], [place] [address], [place], [place] [address]    Cholesterol 04/07/2021 190  0 - 200 mg/dL Final   Triglycerides 04/07/2021 181 (H)  <150 mg/dL Final   HDL 04/07/2021 46  >40 mg/dL Final    Total CHOL/HDL Ratio 04/07/2021 4.1  RATIO Final   VLDL 04/07/2021 36  0 - 40 mg/dL Final   LDL Cholesterol 04/07/2021 108 (H)  0 - 99 mg/dL Final   Comment:        Total Cholesterol/HDL:CHD Risk Coronary Heart Disease Risk Table                     Men   Women  1/2 Average Risk   3.4   3.3  Average Risk       5.0   4.4  2 X Average Risk   9.6   7.1  3 X Average Risk  23.4   11.0        Use the calculated Patient Ratio above and the CHD Risk Table to determine the patient's CHD Risk.        ATP III CLASSIFICATION (LDL):  <100     mg/dL   Optimal  100-129  mg/dL   Near or Above                    Optimal  130-159  mg/dL   Borderline  160-189  mg/dL   High  >190     mg/dL   Very High Performed at [place] [address], [place], [place]  [id number]    TSH 04/07/2021 2.036  0.350 - 4.500 uIU/mL Final   Comment: Performed by a 3rd Generation assay with a functional sensitivity of <=0.01 uIU/mL. Performed at [facility], [place] [address] [place], [place] [address]    POC Amphetamine UR 04/07/2021 None Detected  NONE DETECTED (Cut Off Level 1000 ng/mL) Final   POC Secobarbital (BAR) 04/07/2021 None Detected  NONE DETECTED (Cut Off Level 300 ng/mL) Final   POC Buprenorphine (BUP) 04/07/2021 Positive (A)  NONE DETECTED (Cut Off Level 10 ng/mL) Final   POC Oxazepam (BZO) 04/07/2021 None Detected  NONE DETECTED (Cut Off Level 300 ng/mL) Final   POC Cocaine UR 04/07/2021 Positive (A)  NONE DETECTED (Cut Off Level 300 ng/mL) Final   POC Methamphetamine UR 04/07/2021 None Detected  NONE DETECTED (Cut Off Level 1000 ng/mL) Final   POC Morphine 04/07/2021 None Detected  NONE DETECTED (Cut Off Level 300 ng/mL) Final   POC Oxycodone UR 04/07/2021 None Detected  NONE DETECTED (Cut Off Level 100 ng/mL) Final   POC Methadone UR 04/07/2021 None Detected  NONE DETECTED (Cut Off Level 300 ng/mL) Final   POC Marijuana UR 04/07/2021 None Detected  NONE DETECTED (Cut Off Level 50 ng/mL)  Final   SARSCOV2ONAVIRUS 2 AG 04/07/2021 NEGATIVE  NEGATIVE Final   Comment: (NOTE) SARS-CoV-2 antigen NOT DETECTED.   Negative results are presumptive.  Negative results do not preclude SARS-CoV-2 infection and should not be used as the sole basis for treatment or other patient management decisions, including infection  control decisions, particularly in the presence of clinical signs and  symptoms consistent with COVID-19, or in those who have been in contact with the virus.  Negative results must be combined with clinical observations, patient history, and epidemiological information. The expected result is Negative.  Fact Sheet for Patients: HandmadeRecipes.com.cy  Fact Sheet for Healthcare Providers: FuneralLife.at  This test is not yet approved or cleared by the [place] FDA and  has been authorized for detection and/or diagnosis of SARS-CoV-2 by FDA under an Emergency Use Authorization (EUA).  This EUA will remain in effect (meaning this test can be used) for the duration of  the COV                          ID-19 declaration under Section 564(b)(1) of the Act, 21 U.S.C. section 360bbb-3(b)(1), unless the authorization is terminated or revoked sooner.    Admission on 03/16/2021, Discharged on 03/17/2021  Component Date Value Ref Range Status   SARS Coronavirus 2 by RT PCR 03/16/2021 NEGATIVE  NEGATIVE Final   Comment: (NOTE) SARS-CoV-2 target nucleic acids are NOT DETECTED.  The SARS-CoV-2 RNA is generally detectable in upper respiratory specimens during the acute phase of infection. The lowest concentration of SARS-CoV-2 viral copies this assay can detect is 138 copies/mL. A negative result does not preclude SARS-Cov-2 infection and should not be used as the sole basis for treatment or other patient management decisions. A negative result may occur with  improper specimen collection/handling, submission of specimen  other than nasopharyngeal swab, presence of viral mutation(s) within the areas targeted by this assay, and inadequate number of viral copies(<138 copies/mL). A negative result must be combined with clinical observations, patient history, and epidemiological information. The expected result is Negative.  Fact Sheet for Patients:  EntrepreneurPulse.com.au  Fact Sheet for Healthcare Providers:  IncredibleEmployment.be  This test is no  t yet approved or cleared by the [place] and  has been authorized for detection and/or diagnosis of SARS-CoV-2 by FDA under an Emergency Use Authorization (EUA). This EUA will remain  in effect (meaning this test can be used) for the duration of the COVID-19 declaration under Section 564(b)(1) of the Act, 21 U.S.C.section 360bbb-3(b)(1), unless the authorization is terminated  or revoked sooner.       Influenza A by PCR 03/16/2021 NEGATIVE  NEGATIVE Final   Influenza B by PCR 03/16/2021 NEGATIVE  NEGATIVE Final   Comment: (NOTE) The Xpert Xpress SARS-CoV-2/FLU/RSV plus assay is intended as an aid in the diagnosis of influenza from Nasopharyngeal swab specimens and should not be used as a sole basis for treatment. Nasal washings and aspirates are unacceptable for Xpert Xpress SARS-CoV-2/FLU/RSV testing.  Fact Sheet for Patients: EntrepreneurPulse.com.au  Fact Sheet for Healthcare Providers: IncredibleEmployment.be  This test is not yet approved or cleared by the [place] FDA and has been authorized for detection and/or diagnosis of SARS-CoV-2 by FDA under an Emergency Use Authorization (EUA). This EUA will remain in effect (meaning this test can be used) for the duration of the COVID-19 declaration under Section 564(b)(1) of the Act, 21 U.S.C. section 360bbb-3(b)(1), unless the authorization is terminated or revoked.  Performed at  [facility], [place] [address] [place], [place] [address]    Sodium 03/16/2021 138  135 - 145 mmol/L Final   Potassium 03/16/2021 4.2  3.5 - 5.1 mmol/L Final   Chloride 03/16/2021 101  98 - 111 mmol/L Final   CO2 03/16/2021 30  22 - 32 mmol/L Final   Glucose, Bld 03/16/2021 92  70 - 99 mg/dL Final   Glucose reference range applies only to samples taken after fasting for at least 8 hours.   BUN 03/16/2021 14  6 - 20 mg/dL Final   Creatinine, Ser 03/16/2021 1.18  0.61 - 1.24 mg/dL Final   Calcium 03/16/2021 9.5  8.9 - 10.3 mg/dL Final   Total Protein 03/16/2021 7.5  6.5 - 8.1 g/dL Final   Albumin 03/16/2021 4.5  3.5 - 5.0 g/dL Final   AST 03/16/2021 27  15 - 41 U/L Final   ALT 03/16/2021 30  0 - 44 U/L Final   Alkaline Phosphatase 03/16/2021 69  38 - 126 U/L Final   Total Bilirubin 03/16/2021 0.7  0.3 - 1.2 mg/dL Final   GFR, Estimated 03/16/2021 >60  >60 mL/min Final   Comment: (NOTE) Calculated using the CKD-EPI Creatinine Equation (2021)    Anion gap 03/16/2021 7  5 - 15 Final   Performed at [facility], [place] [address] [place], [place] [address]   Alcohol, Ethyl (B) 03/16/2021 <10  <10 mg/dL Final   Comment: (NOTE) Lowest detectable limit for serum alcohol is 10 mg/dL.  For medical purposes only. Performed at [facility], [place] [address], [place], [place] [address]    Opiates 03/16/2021 NONE DETECTED  NONE DETECTED Final   Cocaine 03/16/2021 NONE DETECTED  NONE DETECTED Final   Benzodiazepines 03/16/2021 NONE DETECTED  NONE DETECTED Final   Amphetamines 03/16/2021 NONE DETECTED  NONE DETECTED Final   Tetrahydrocannabinol 03/16/2021 POSITIVE (A)  NONE DETECTED Final   Barbiturates 03/16/2021 NONE DETECTED  NONE DETECTED Final   Comment: (NOTE) DRUG SCREEN FOR MEDICAL PURPOSES ONLY.  IF CONFIRMATION IS NEEDED FOR ANY PURPOSE, NOTIFY LAB WITHIN 5 DAYS.  LOWEST DETECTABLE LIMITS FOR URINE DRUG SCREEN Drug Class  Cutoff (ng/mL) Amphetamine and metabolites    1000 Barbiturate and metabolites    200 Benzodiazepine                 A999333 Tricyclics and metabolites     300 Opiates and metabolites        300 Cocaine and metabolites        300 THC                            50 Performed at [facility], [place] [address] [place], [place] [address]    WBC 03/16/2021 4.6  4.0 - 10.5 K/uL Final   RBC 03/16/2021 5.39  4.22 - 5.81 MIL/uL Final   Hemoglobin 03/16/2021 16.2  13.0 - 17.0 g/dL Final   HCT 03/16/2021 49.6  39.0 - 52.0 % Final   MCV 03/16/2021 92.0  80.0 - 100.0 fL Final   MCH 03/16/2021 30.1  26.0 - 34.0 pg Final   MCHC 03/16/2021 32.7  30.0 - 36.0 g/dL Final   RDW 03/16/2021 13.0  11.5 - 15.5 % Final   Platelets 03/16/2021 224  150 - 400 K/uL Final   nRBC 03/16/2021 0.0  0.0 - 0.2 % Final   Neutrophils Relative % 03/16/2021 44  % Final   Neutro Abs 03/16/2021 2.0  1.7 - 7.7 K/uL Final   Lymphocytes Relative 03/16/2021 42  % Final   Lymphs Abs 03/16/2021 1.9  0.7 - 4.0 K/uL Final   Monocytes Relative 03/16/2021 11  % Final   Monocytes Absolute 03/16/2021 0.5  0.1 - 1.0 K/uL Final   Eosinophils Relative 03/16/2021 2  % Final   Eosinophils Absolute 03/16/2021 0.1  0.0 - 0.5 K/uL Final   Basophils Relative 03/16/2021 1  % Final   Basophils Absolute 03/16/2021 0.0  0.0 - 0.1 K/uL Final   Immature Granulocytes 03/16/2021 0  % Final   Abs Immature Granulocytes 03/16/2021 0.01  0.00 - 0.07 K/uL Final   Performed at [facility], [place] [address], [place], [place] [address]   Acetaminophen (Tylenol), Serum 03/16/2021 <10 (L)  10 - 30 ug/mL Final   Comment: (NOTE) Therapeutic concentrations vary significantly. A range of 10-30 ug/mL  may be an effective concentration for many patients. However, some  are best treated at concentrations outside of this range. Acetaminophen concentrations >150 ug/mL at 4 hours after ingestion  and >50 ug/mL at 12 hours  after ingestion are often associated with  toxic reactions.  Performed at [facility], [place] [address], [place], [place] [id number]    Salicylate Lvl 123456 <7.0 (L)  7.0 - 30.0 mg/dL Final   Performed at [place] [address] [place], [place] [address]  Admission on 03/09/2021, Discharged on 03/09/2021  Component Date Value Ref Range Status   WBC 03/09/2021 7.6  4.0 - 10.5 K/uL Final   RBC 03/09/2021 5.27  4.22 - 5.81 MIL/uL Final   Hemoglobin 03/09/2021 15.8  13.0 - 17.0 g/dL Final   HCT 03/09/2021 46.0  39.0 - 52.0 % Final   MCV 03/09/2021 87.3  80.0 - 100.0 fL Final   MCH 03/09/2021 30.0  26.0 - 34.0 pg Final   MCHC 03/09/2021 34.3  30.0 - 36.0 g/dL Final   RDW 03/09/2021 12.8  11.5 - 15.5 % Final   Platelets 03/09/2021 270  150 - 400 K/uL Final   nRBC 03/09/2021 0.0  0.0 - 0.2 %  Final   Neutrophils Relative % 03/09/2021 71  % Final   Neutro Abs 03/09/2021 5.3  1.7 - 7.7 K/uL Final   Lymphocytes Relative 03/09/2021 24  % Final   Lymphs Abs 03/09/2021 1.8  0.7 - 4.0 K/uL Final   Monocytes Relative 03/09/2021 5  % Final   Monocytes Absolute 03/09/2021 0.4  0.1 - 1.0 K/uL Final   Eosinophils Relative 03/09/2021 0  % Final   Eosinophils Absolute 03/09/2021 0.0  0.0 - 0.5 K/uL Final   Basophils Relative 03/09/2021 0  % Final   Basophils Absolute 03/09/2021 0.0  0.0 - 0.1 K/uL Final   Immature Granulocytes 03/09/2021 0  % Final   Abs Immature Granulocytes 03/09/2021 0.03  0.00 - 0.07 K/uL Final   Performed at [facility], [place], [place] [address]   Sodium 03/09/2021 134 (L)  135 - 145 mmol/L Final   Potassium 03/09/2021 4.1  3.5 - 5.1 mmol/L Final   Chloride 03/09/2021 97 (L)  98 - 111 mmol/L Final   CO2 03/09/2021 29  22 - 32 mmol/L Final   Glucose, Bld 03/09/2021 116 (H)  70 - 99 mg/dL Final   Glucose reference range applies only to samples taken after fasting for at least 8 hours.   BUN 03/09/2021 16   6 - 20 mg/dL Final   Creatinine, Ser 03/09/2021 1.15  0.61 - 1.24 mg/dL Final   Calcium 03/09/2021 9.8  8.9 - 10.3 mg/dL Final   Total Protein 03/09/2021 7.8  6.5 - 8.1 g/dL Final   Albumin 03/09/2021 4.6  3.5 - 5.0 g/dL Final   AST 03/09/2021 42 (H)  15 - 41 U/L Final   ALT 03/09/2021 107 (H)  0 - 44 U/L Final   Alkaline Phosphatase 03/09/2021 79  38 - 126 U/L Final   Total Bilirubin 03/09/2021 1.0  0.3 - 1.2 mg/dL Final   GFR, Estimated 03/09/2021 >60  >60 mL/min Final   Comment: (NOTE) Calculated using the CKD-EPI Creatinine Equation (2021)    Anion gap 03/09/2021 8  5 - 15 Final   Performed at [facility], [place] [place], [place] [address]   Alcohol, Ethyl (B) 03/09/2021 <10  <10 mg/dL Final   Comment: (NOTE) Lowest detectable limit for serum alcohol is 10 mg/dL.  For medical purposes only. Performed at [facility], [place] [place], [place] [id number]    Tricyclic, Ur Screen [id number] NONE DETECTED  NONE DETECTED Final   Amphetamines, Ur Screen 03/09/2021 NONE DETECTED  NONE DETECTED Final   MDMA (Ecstasy)Ur Screen 03/09/2021 NONE DETECTED  NONE DETECTED Final   Cocaine Metabolite,Ur [place] 03/09/2021 POSITIVE (A)  NONE DETECTED Final   Opiate, Ur Screen 03/09/2021 NONE DETECTED  NONE DETECTED Final   Phencyclidine (PCP) Ur S 03/09/2021 NONE DETECTED  NONE DETECTED Final   Cannabinoid 50 Ng, Ur [place] 03/09/2021 POSITIVE (A)  NONE DETECTED Final   Barbiturates, Ur Screen 03/09/2021 NONE DETECTED  NONE DETECTED Final   Benzodiazepine, Ur Scrn 03/09/2021 NONE DETECTED  NONE DETECTED Final   Methadone Scn, Ur 03/09/2021 NONE DETECTED  NONE DETECTED Final   Comment: (NOTE) Tricyclics + metabolites, urine    Cutoff 1000 ng/mL Amphetamines + metabolites, urine  Cutoff 1000 ng/mL MDMA (Ecstasy), urine              Cutoff 500 ng/mL Cocaine Metabolite, urine          Cutoff 300 ng/mL Opiate + metabolites, urine  Cutoff 300 ng/mL Phencyclidine  (PCP), urine         Cutoff 25 ng/mL Cannabinoid, urine                 Cutoff 50 ng/mL Barbiturates + metabolites, urine  Cutoff 200 ng/mL Benzodiazepine, urine              Cutoff 200 ng/mL Methadone, urine                   Cutoff 300 ng/mL  The urine drug screen provides only a preliminary, unconfirmed analytical test result and should not be used for non-medical purposes. Clinical consideration and professional judgment should be applied to any positive drug screen result due to possible interfering substances. A more specific alternate chemical method must be used in order to obtain a confirmed analytical result. Gas chromatography / mass spectrometry (GC/MS) is the preferred confirm                          atory method. Performed at [facility], [address] [place], [place] [address]   Admission on 02/17/2021, Discharged on 02/19/2021  Component Date Value Ref Range Status   Sodium 02/17/2021 132 (L)  135 - 145 mmol/L Final   Potassium 02/17/2021 4.2  3.5 - 5.1 mmol/L Final   Chloride 02/17/2021 102  98 - 111 mmol/L Final   CO2 02/17/2021 25  22 - 32 mmol/L Final   Glucose, Bld 02/17/2021 93  70 - 99 mg/dL Final   Glucose reference range applies only to samples taken after fasting for at least 8 hours.   BUN 02/17/2021 21 (H)  6 - 20 mg/dL Final   Creatinine, Ser 02/17/2021 1.25 (H)  0.61 - 1.24 mg/dL Final   Calcium 02/17/2021 9.1  8.9 - 10.3 mg/dL Final   Total Protein 02/17/2021 7.6  6.5 - 8.1 g/dL Final   Albumin 02/17/2021 4.2  3.5 - 5.0 g/dL Final   AST 02/17/2021 22  15 - 41 U/L Final   ALT 02/17/2021 18  0 - 44 U/L Final   Alkaline Phosphatase 02/17/2021 74  38 - 126 U/L Final   Total Bilirubin 02/17/2021 0.7  0.3 - 1.2 mg/dL Final   GFR, Estimated 02/17/2021 >60  >60 mL/min Final   Comment: (NOTE) Calculated using the CKD-EPI Creatinine Equation (2021)    Anion gap 02/17/2021 5  5 - 15 Final   Performed at [facility], [address] [place], [place] [address]   Alcohol, Ethyl (B) 02/17/2021 <10  <10 mg/dL Final   Comment: (NOTE) Lowest detectable limit for serum alcohol is 10 mg/dL.  For medical purposes only. Performed at [facility], [address] [place], [place] [id number]    Salicylate Lvl [id number] <7.0 (L)  7.0 - 30.0 mg/dL Final   Performed at [facility], [place], [place] [address]   Acetaminophen (Tylenol), Serum 02/17/2021 <10 (L)  10 - 30 ug/mL Final   Comment: (NOTE) Therapeutic concentrations vary significantly. A range of 10-30 ug/mL  may be an effective concentration for many patients. However, some  are best treated at concentrations outside of this range. Acetaminophen concentrations >150 ug/mL at 4 hours after ingestion  and >50 ug/mL at 12 hours after ingestion are often associated with  toxic reactions.  Performed at [facility], [address] [place], [place] [address]    WBC 02/17/2021 10.2  4.0 -  10.5 K/uL Final   RBC 02/17/2021 5.30  4.22 - 5.81 MIL/uL Final   Hemoglobin 02/17/2021 15.7  13.0 - 17.0 g/dL Final   HCT 02/17/2021 46.1  39.0 - 52.0 % Final   MCV 02/17/2021 87.0  80.0 - 100.0 fL Final   MCH 02/17/2021 29.6  26.0 - 34.0 pg Final   MCHC 02/17/2021 34.1  30.0 - 36.0 g/dL Final   RDW 02/17/2021 12.7  11.5 - 15.5 % Final   Platelets 02/17/2021 269  150 - 400 K/uL Final   nRBC 02/17/2021 0.0  0.0 - 0.2 % Final   Performed at [facility] [address] [place], [place] [id number]   Tricyclic, Ur Screen [id number] NONE DETECTED  NONE DETECTED Final   Amphetamines, Ur Screen 02/17/2021 POSITIVE (A)  NONE DETECTED Final   MDMA (Ecstasy)Ur Screen 02/17/2021 NONE DETECTED  NONE DETECTED Final   Cocaine Metabolite,Ur [place] 02/17/2021 POSITIVE (A)  NONE DETECTED Final   Opiate, Ur Screen 02/17/2021 NONE DETECTED  NONE DETECTED Final   Phencyclidine (PCP) Ur S 02/17/2021 NONE DETECTED  NONE DETECTED Final   Cannabinoid  50 Ng, Ur [place] 02/17/2021 POSITIVE (A)  NONE DETECTED Final   Barbiturates, Ur Screen 02/17/2021 NONE DETECTED  NONE DETECTED Final   Benzodiazepine, Ur Scrn 02/17/2021 NONE DETECTED  NONE DETECTED Final   Methadone Scn, Ur 02/17/2021 NONE DETECTED  NONE DETECTED Final   Comment: (NOTE) Tricyclics + metabolites, urine    Cutoff 1000 ng/mL Amphetamines + metabolites, urine  Cutoff 1000 ng/mL MDMA (Ecstasy), urine              Cutoff 500 ng/mL Cocaine Metabolite, urine          Cutoff 300 ng/mL Opiate + metabolites, urine        Cutoff 300 ng/mL Phencyclidine (PCP), urine         Cutoff 25 ng/mL Cannabinoid, urine                 Cutoff 50 ng/mL Barbiturates + metabolites, urine  Cutoff 200 ng/mL Benzodiazepine, urine              Cutoff 200 ng/mL Methadone, urine                   Cutoff 300 ng/mL  The urine drug screen provides only a preliminary, unconfirmed analytical test result and should not be used for non-medical purposes. Clinical consideration and professional judgment should be applied to any positive drug screen result due to possible interfering substances. A more specific alternate chemical method must be used in order to obtain a confirmed analytical result. Gas chromatography / mass spectrometry (GC/MS) is the preferred confirm                          atory method. Performed at [facility], [address] [place], [place] [address]    Total CK 02/17/2021 83  49 - 397 U/L Final   Performed at [facility], [address] [place], [place] [address]   SARS Coronavirus 2 by RT PCR 02/17/2021 NEGATIVE  NEGATIVE Final   Comment: (NOTE) SARS-CoV-2 target nucleic acids are NOT DETECTED.  The SARS-CoV-2 RNA is generally detectable in upper respiratory specimens during the acute phase of infection. The lowest concentration of SARS-CoV-2 viral copies this assay can detect is 138 copies/mL. A negative result does not preclude SARS-Cov-2 infection and  should not be used as the sole basis  for treatment or other patient management decisions. A negative result may occur with  improper specimen collection/handling, submission of specimen other than nasopharyngeal swab, presence of viral mutation(s) within the areas targeted by this assay, and inadequate number of viral copies(<138 copies/mL). A negative result must be combined with clinical observations, patient history, and epidemiological information. The expected result is Negative.  Fact Sheet for Patients:  EntrepreneurPulse.com.au  Fact Sheet for Healthcare Providers:  IncredibleEmployment.be  This test is no                          t yet approved or cleared by the [place] FDA and  has been authorized for detection and/or diagnosis of SARS-CoV-2 by FDA under an Emergency Use Authorization (EUA). This EUA will remain  in effect (meaning this test can be used) for the duration of the COVID-19 declaration under Section 564(b)(1) of the Act, 21 U.S.C.section 360bbb-3(b)(1), unless the authorization is terminated  or revoked sooner.       Influenza A by PCR 02/17/2021 NEGATIVE  NEGATIVE Final   Influenza B by PCR 02/17/2021 NEGATIVE  NEGATIVE Final   Comment: (NOTE) The Xpert Xpress SARS-CoV-2/FLU/RSV plus assay is intended as an aid in the diagnosis of influenza from Nasopharyngeal swab specimens and should not be used as a sole basis for treatment. Nasal washings and aspirates are unacceptable for Xpert Xpress SARS-CoV-2/FLU/RSV testing.  Fact Sheet for Patients: EntrepreneurPulse.com.au  Fact Sheet for Healthcare Providers: IncredibleEmployment.be  This test is not yet approved or cleared by the [place] FDA and has been authorized for detection and/or diagnosis of SARS-CoV-2 by FDA under an Emergency Use Authorization (EUA). This EUA will remain in effect (meaning this test can be used)  for the duration of the COVID-19 declaration under Section 564(b)(1) of the Act, 21 U.S.C. section 360bbb-3(b)(1), unless the authorization is terminated or revoked.  Performed at [facility], [address] [place], [place] [address]   Admission on 01/29/2021, Discharged on 01/29/2021  Component Date Value Ref Range Status   Tricyclic, Ur Screen [id number] NONE DETECTED  NONE DETECTED Final   Amphetamines, Ur Screen 01/29/2021 NONE DETECTED  NONE DETECTED Final   MDMA (Ecstasy)Ur Screen 01/29/2021 NONE DETECTED  NONE DETECTED Final   Cocaine Metabolite,Ur [place] 01/29/2021 POSITIVE (A)  NONE DETECTED Final   Opiate, Ur Screen 01/29/2021 NONE DETECTED  NONE DETECTED Final   Phencyclidine (PCP) Ur S 01/29/2021 NONE DETECTED  NONE DETECTED Final   Cannabinoid 50 Ng, Ur [place] 01/29/2021 POSITIVE (A)  NONE DETECTED Final   Barbiturates, Ur Screen 01/29/2021 NONE DETECTED  NONE DETECTED Final   Benzodiazepine, Ur Scrn 01/29/2021 NONE DETECTED  NONE DETECTED Final   Methadone Scn, Ur 01/29/2021 NONE DETECTED  NONE DETECTED Final   Comment: (NOTE) Tricyclics + metabolites, urine    Cutoff 1000 ng/mL Amphetamines + metabolites, urine  Cutoff 1000 ng/mL MDMA (Ecstasy), urine              Cutoff 500 ng/mL Cocaine Metabolite, urine          Cutoff 300 ng/mL Opiate + metabolites, urine        Cutoff 300 ng/mL Phencyclidine (PCP), urine         Cutoff 25 ng/mL Cannabinoid, urine                 Cutoff 50 ng/mL Barbiturates + metabolites, urine  Cutoff 200 ng/mL Benzodiazepine, urine  Cutoff 200 ng/mL Methadone, urine                   Cutoff 300 ng/mL  The urine drug screen provides only a preliminary, unconfirmed analytical test result and should not be used for non-medical purposes. Clinical consideration and professional judgment should be applied to any positive drug screen result due to possible interfering substances. A more specific alternate chemical method must be  used in order to obtain a confirmed analytical result. Gas chromatography / mass spectrometry (GC/MS) is the preferred confirm                          atory method. Performed at [facility], [address] [place], [place] [address]    WBC 01/29/2021 9.1  4.0 - 10.5 K/uL Final   RBC 01/29/2021 5.29  4.22 - 5.81 MIL/uL Final   Hemoglobin 01/29/2021 15.9  13.0 - 17.0 g/dL Final   HCT 01/29/2021 46.6  39.0 - 52.0 % Final   MCV 01/29/2021 88.1  80.0 - 100.0 fL Final   MCH 01/29/2021 30.1  26.0 - 34.0 pg Final   MCHC 01/29/2021 34.1  30.0 - 36.0 g/dL Final   RDW 01/29/2021 12.7  11.5 - 15.5 % Final   Platelets 01/29/2021 254  150 - 400 K/uL Final   nRBC 01/29/2021 0.0  0.0 - 0.2 % Final   Performed at [facility], [place], [place] [address]   Sodium 01/29/2021 134 (L)  135 - 145 mmol/L Final   Potassium 01/29/2021 3.5  3.5 - 5.1 mmol/L Final   Chloride 01/29/2021 97 (L)  98 - 111 mmol/L Final   CO2 01/29/2021 28  22 - 32 mmol/L Final   Glucose, Bld 01/29/2021 123 (H)  70 - 99 mg/dL Final   Glucose reference range applies only to samples taken after fasting for at least 8 hours.   BUN 01/29/2021 14  6 - 20 mg/dL Final   Creatinine, Ser 01/29/2021 1.15  0.61 - 1.24 mg/dL Final   Calcium 01/29/2021 9.4  8.9 - 10.3 mg/dL Final   GFR, Estimated 01/29/2021 >60  >60 mL/min Final   Comment: (NOTE) Calculated using the CKD-EPI Creatinine Equation (2021)    Anion gap 01/29/2021 9  5 - 15 Final   Performed at [facility], [address] [place], [place] [address]   Alcohol, Ethyl (B) 01/29/2021 <10  <10 mg/dL Final   Comment: (NOTE) Lowest detectable limit for serum alcohol is 10 mg/dL.  For medical purposes only. Performed at [facility], [address] [place], [place] [id number]    Salicylate Lvl 123456 <7.0 (L)  7.0 - 30.0 mg/dL Final   Performed at [facility], [place], [place] [address]    Acetaminophen (Tylenol), Serum 01/29/2021 <10 (L)  10 - 30 ug/mL Final   Comment: (NOTE) Therapeutic concentrations vary significantly. A range of 10-30 ug/mL  may be an effective concentration for many patients. However, some  are best treated at concentrations outside of this range. Acetaminophen concentrations >150 ug/mL at 4 hours after ingestion  and >50 ug/mL at 12 hours after ingestion are often associated with  toxic reactions.  Performed at [facility], [address] [place], [place] [address]    SARS Coronavirus 2 by RT PCR 01/29/2021 NEGATIVE  NEGATIVE Final   Comment: (NOTE) SARS-CoV-2 target nucleic acids are NOT DETECTED.  The SARS-CoV-2 RNA  is generally detectable in upper respiratory specimens during the acute phase of infection. The lowest concentration of SARS-CoV-2 viral copies this assay can detect is 138 copies/mL. A negative result does not preclude SARS-Cov-2 infection and should not be used as the sole basis for treatment or other patient management decisions. A negative result may occur with  improper specimen collection/handling, submission of specimen other than nasopharyngeal swab, presence of viral mutation(s) within the areas targeted by this assay, and inadequate number of viral copies(<138 copies/mL). A negative result must be combined with clinical observations, patient history, and epidemiological information. The expected result is Negative.  Fact Sheet for Patients:  EntrepreneurPulse.com.au  Fact Sheet for Healthcare Providers:  IncredibleEmployment.be  This test is no                          t yet approved or cleared by the [place] FDA and  has been authorized for detection and/or diagnosis of SARS-CoV-2 by FDA under an Emergency Use Authorization (EUA). This EUA will remain  in effect (meaning this test can be used) for the duration of the COVID-19 declaration under Section 564(b)(1)  of the Act, 21 U.S.C.section 360bbb-3(b)(1), unless the authorization is terminated  or revoked sooner.       Influenza A by PCR 01/29/2021 NEGATIVE  NEGATIVE Final   Influenza B by PCR 01/29/2021 NEGATIVE  NEGATIVE Final   Comment: (NOTE) The Xpert Xpress SARS-CoV-2/FLU/RSV plus assay is intended as an aid in the diagnosis of influenza from Nasopharyngeal swab specimens and should not be used as a sole basis for treatment. Nasal washings and aspirates are unacceptable for Xpert Xpress SARS-CoV-2/FLU/RSV testing.  Fact Sheet for Patients: EntrepreneurPulse.com.au  Fact Sheet for Healthcare Providers: IncredibleEmployment.be  This test is not yet approved or cleared by the [place] FDA and has been authorized for detection and/or diagnosis of SARS-CoV-2 by FDA under an Emergency Use Authorization (EUA). This EUA will remain in effect (meaning this test can be used) for the duration of the COVID-19 declaration under Section 564(b)(1) of the Act, 21 U.S.C. section 360bbb-3(b)(1), unless the authorization is terminated or revoked.  Performed at [facility], [address] [place], [place] [address]    Glucose-Capillary 01/29/2021 108 (H)  70 - 99 mg/dL Final   Glucose reference range applies only to samples taken after fasting for at least 8 hours.   Comment 1 01/29/2021 Notify RN   Final   Comment 2 01/29/2021 Document in Chart   Final  Admission on 12/06/2020, Discharged on 12/09/2020  Component Date Value Ref Range Status   SARS Coronavirus 2 by RT PCR 12/06/2020 NEGATIVE  NEGATIVE Final   Comment: (NOTE) SARS-CoV-2 target nucleic acids are NOT DETECTED.  The SARS-CoV-2 RNA is generally detectable in upper respiratory specimens during the acute phase of infection. The lowest concentration of SARS-CoV-2 viral copies this assay can detect is 138 copies/mL. A negative result does not preclude SARS-Cov-2 infection and should  not be used as the sole basis for treatment or other patient management decisions. A negative result may occur with  improper specimen collection/handling, submission of specimen other than nasopharyngeal swab, presence of viral mutation(s) within the areas targeted by this assay, and inadequate number of viral copies(<138 copies/mL). A negative result must be combined with clinical observations, patient history, and epidemiological information. The expected result is Negative.  Fact Sheet for Patients:  EntrepreneurPulse.com.au  Fact Sheet for Healthcare Providers:  IncredibleEmployment.be  This  test is no                          t yet approved or cleared by the [place] and  has been authorized for detection and/or diagnosis of SARS-CoV-2 by FDA under an Emergency Use Authorization (EUA). This EUA will remain  in effect (meaning this test can be used) for the duration of the COVID-19 declaration under Section 564(b)(1) of the Act, 21 U.S.C.section 360bbb-3(b)(1), unless the authorization is terminated  or revoked sooner.       Influenza A by PCR 12/06/2020 NEGATIVE  NEGATIVE Final   Influenza B by PCR 12/06/2020 NEGATIVE  NEGATIVE Final   Comment: (NOTE) The Xpert Xpress SARS-CoV-2/FLU/RSV plus assay is intended as an aid in the diagnosis of influenza from Nasopharyngeal swab specimens and should not be used as a sole basis for treatment. Nasal washings and aspirates are unacceptable for Xpert Xpress SARS-CoV-2/FLU/RSV testing.  Fact Sheet for Patients: EntrepreneurPulse.com.au  Fact Sheet for Healthcare Providers: IncredibleEmployment.be  This test is not yet approved or cleared by the [place] FDA and has been authorized for detection and/or diagnosis of SARS-CoV-2 by FDA under an Emergency Use Authorization (EUA). This EUA will remain in effect (meaning this test can be used) for the  duration of the COVID-19 declaration under Section 564(b)(1) of the Act, 21 U.S.C. section 360bbb-3(b)(1), unless the authorization is terminated or revoked.  Performed at [facility], [place], [place] [address]    Sodium 12/06/2020 137  135 - 145 mmol/L Final   ELECTROLYTES REPEATED TO VERIFY GAA   Potassium 12/06/2020 4.0  3.5 - 5.1 mmol/L Final   Chloride 12/06/2020 96 (L)  98 - 111 mmol/L Final   CO2 12/06/2020 16 (L)  22 - 32 mmol/L Final   Glucose, Bld 12/06/2020 276 (H)  70 - 99 mg/dL Final   Glucose reference range applies only to samples taken after fasting for at least 8 hours.   BUN 12/06/2020 20  6 - 20 mg/dL Final   Creatinine, Ser 12/06/2020 2.44 (H)  0.61 - 1.24 mg/dL Final   Calcium 12/06/2020 9.9  8.9 - 10.3 mg/dL Final   Total Protein 12/06/2020 7.7  6.5 - 8.1 g/dL Final   Albumin 12/06/2020 4.4  3.5 - 5.0 g/dL Final   AST 12/06/2020 86 (H)  15 - 41 U/L Final   ALT 12/06/2020 43  0 - 44 U/L Final   Alkaline Phosphatase 12/06/2020 70  38 - 126 U/L Final   Total Bilirubin 12/06/2020 0.8  0.3 - 1.2 mg/dL Final   GFR, Estimated 12/06/2020 36 (L)  >60 mL/min Final   Comment: (NOTE) Calculated using the CKD-EPI Creatinine Equation (2021)    Anion gap 12/06/2020 25 (H)  5 - 15 Final   Performed at [facility], [place], [place] [address]   WBC 12/06/2020 9.8  4.0 - 10.5 K/uL Final   RBC 12/06/2020 5.89 (H)  4.22 - 5.81 MIL/uL Final   Hemoglobin 12/06/2020 17.3 (H)  13.0 - 17.0 g/dL Final   HCT 12/06/2020 52.4 (H)  39.0 - 52.0 % Final   MCV 12/06/2020 89.0  80.0 - 100.0 fL Final   MCH 12/06/2020 29.4  26.0 - 34.0 pg Final   MCHC 12/06/2020 33.0  30.0 - 36.0 g/dL Final   RDW 12/06/2020 12.2  11.5 - 15.5 % Final   Platelets 12/06/2020 302  150 - 400  K/uL Final   nRBC 12/06/2020 0.0  0.0 - 0.2 % Final   Performed at [facility], [address] [place], [place] [address]   Alcohol, Ethyl (B) 12/06/2020 <10   <10 mg/dL Final   Comment: (NOTE) Lowest detectable limit for serum alcohol is 10 mg/dL.  For medical purposes only. Performed at [facility], [address] [place], [place] [address]    Lactic Acid, Venous 12/06/2020 >9.0 (HH)  0.5 - 1.9 mmol/L Final   Comment: CRITICAL RESULT CALLED TO, READ BACK BY AND VERIFIED WITH Melina Schools RN AT 1037 ON 12/06/20 GAA Performed at [facility], [address] [place], [place] [address]    Prothrombin Time 12/06/2020 14.9  11.4 - 15.2 seconds Final   INR 12/06/2020 1.2  0.8 - 1.2 Final   Comment: (NOTE) INR goal varies based on device and disease states. Performed at [facility], [place], [place] [address]    Color, Urine 12/07/2020 RED (A)  YELLOW Final   APPearance 12/07/2020 BLOODY (A)  CLEAR Final   Specific Gravity, Urine 12/07/2020 1.048 (H)  1.005 - 1.030 Final   pH 12/07/2020 TEST NOT REPORTED DUE TO COLOR INTERFERENCE OF URINE PIGMENT  5.0 - 8.0 Final   Glucose, UA 12/07/2020 TEST NOT REPORTED DUE TO COLOR INTERFERENCE OF URINE PIGMENT (A)  NEGATIVE mg/dL Final   Hgb urine dipstick 12/07/2020 TEST NOT REPORTED DUE TO COLOR INTERFERENCE OF URINE PIGMENT (A)  NEGATIVE Final   Bilirubin Urine 12/07/2020 TEST NOT REPORTED DUE TO COLOR INTERFERENCE OF URINE PIGMENT (A)  NEGATIVE Final   Ketones, ur 12/07/2020 TEST NOT REPORTED DUE TO COLOR INTERFERENCE OF URINE PIGMENT (A)  NEGATIVE mg/dL Final   Protein, ur 12/07/2020 TEST NOT REPORTED DUE TO COLOR INTERFERENCE OF URINE PIGMENT (A)  NEGATIVE mg/dL Final   Nitrite 12/07/2020 TEST NOT REPORTED DUE TO COLOR INTERFERENCE OF URINE PIGMENT (A)  NEGATIVE Final   Leukocytes,Ua 12/07/2020 TEST NOT REPORTED DUE TO COLOR INTERFERENCE OF URINE PIGMENT (A)  NEGATIVE Final   Performed at [facility], [address] [place], [place] [address]   CK, MB 12/06/2020 3.9  0.5 - 5.0 ng/mL Final   Performed at [facility], [place], [place] [id number]   Tricyclic, Ur Screen [id number] NONE DETECTED  NONE DETECTED Final   Amphetamines, Ur Screen 12/07/2020 NONE DETECTED  NONE DETECTED Final   MDMA (Ecstasy)Ur Screen 12/07/2020 NONE DETECTED  NONE DETECTED Final   Cocaine Metabolite,Ur [place] 12/07/2020 POSITIVE (A)  NONE DETECTED Final   Opiate, Ur Screen 12/07/2020 NONE DETECTED  NONE DETECTED Final   Phencyclidine (PCP) Ur S 12/07/2020 NONE DETECTED  NONE DETECTED Final   Cannabinoid 50 Ng, Ur [place] 12/07/2020 POSITIVE (A)  NONE DETECTED Final   Barbiturates, Ur Screen 12/07/2020 NONE DETECTED  NONE DETECTED Final   Benzodiazepine, Ur Scrn 12/07/2020 POSITIVE (A)  NONE DETECTED Final   Methadone Scn, Ur 12/07/2020 NONE DETECTED  NONE DETECTED Final   Comment: (NOTE) Tricyclics + metabolites, urine    Cutoff 1000 ng/mL Amphetamines + metabolites, urine  Cutoff 1000 ng/mL MDMA (Ecstasy), urine              Cutoff 500 ng/mL Cocaine Metabolite, urine          Cutoff 300 ng/mL Opiate + metabolites, urine        Cutoff 300 ng/mL Phencyclidine (PCP), urine         Cutoff 25 ng/mL  Cannabinoid, urine                 Cutoff 50 ng/mL Barbiturates + metabolites, urine  Cutoff 200 ng/mL Benzodiazepine, urine              Cutoff 200 ng/mL Methadone, urine                   Cutoff 300 ng/mL  The urine drug screen provides only a preliminary, unconfirmed analytical test result and should not be used for non-medical purposes. Clinical consideration and professional judgment should be applied to any positive drug screen result due to possible interfering substances. A more specific alternate chemical method must be used in order to obtain a confirmed analytical result. Gas chromatography / mass spectrometry (GC/MS) is the preferred confirm                          atory method. Performed at [facility], [address] [place], [place] [address]    ABO/RH(D) 12/06/2020 A POS   Final   Antibody Screen  12/06/2020 NEG   Final   Sample Expiration 12/06/2020    Final                   Value:12/09/2020,2359 Performed at [facility], [address] [place], [place] [address]    FIO2 12/06/2020 40.00   Final   Delivery systems 12/06/2020 VENTILATOR   Final   Mode 12/06/2020 ASSIST CONTROL   Final   VT 12/06/2020 440  mL Final   LHR 12/06/2020 20  resp/min Final   Peep/cpap 12/06/2020 5.0  cm H20 Final   pH, Arterial 12/06/2020 7.42  7.350 - 7.450 Final   pCO2 arterial 12/06/2020 37  32.0 - 48.0 mmHg Final   pO2, Arterial 12/06/2020 171 (H)  83.0 - 108.0 mmHg Final   Bicarbonate 12/06/2020 24.0  20.0 - 28.0 mmol/L Final   Acid-base deficit 12/06/2020 0.2  0.0 - 2.0 mmol/L Final   O2 Saturation 12/06/2020 99.6  % Final   Patient temperature 12/06/2020 37.0   Final   Collection site 12/06/2020 REVIEWED BY   Final   Sample type 12/06/2020 ARTERIAL DRAW   Final   Performed at [facility], [address] [place], [place] [id number]   Salicylate Lvl [id number] <7.0 (L)  7.0 - 30.0 mg/dL Final   Performed at [facility], [address] [place], [place] [address]   Acetaminophen (Tylenol), Serum 12/06/2020 <10 (L)  10 - 30 ug/mL Final   Comment: (NOTE) Therapeutic concentrations vary significantly. A range of 10-30 ug/mL  may be an effective concentration for many patients. However, some  are best treated at concentrations outside of this range. Acetaminophen concentrations >150 ug/mL at 4 hours after ingestion  and >50 ug/mL at 12 hours after ingestion are often associated with  toxic reactions.  Performed at [facility], [address] [place], [place] [address]    Glucose-Capillary 12/06/2020 171 (H)  70 - 99 mg/dL Final   Glucose reference range applies only to samples taken after fasting for at least 8 hours.   Sodium 12/07/2020 137  135 - 145 mmol/L Final   Potassium 12/07/2020 4.0  3.5 - 5.1 mmol/L Final   Chloride 12/07/2020 102  98 -  111 mmol/L Final   CO2 12/07/2020 30  22 - 32 mmol/L Final   Glucose, Bld 12/07/2020 119 (H)  70 - 99 mg/dL Final  Glucose reference range applies only to samples taken after fasting for at least 8 hours.   BUN 12/07/2020 15  6 - 20 mg/dL Final   Creatinine, Ser 12/07/2020 1.46 (H)  0.61 - 1.24 mg/dL Final   Calcium 16/10/960410/17/2022 8.9  8.9 - 10.3 mg/dL Final   GFR, Estimated 12/07/2020 >60  >60 mL/min Final   Comment: (NOTE) Calculated using the CKD-EPI Creatinine Equation (2021)    Anion gap 12/07/2020 5  5 - 15 Final   Performed at [facility], [address] [place], [place] [address]   WBC 12/07/2020 12.6 (H)  4.0 - 10.5 K/uL Final   RBC 12/07/2020 5.24  4.22 - 5.81 MIL/uL Final   Hemoglobin 12/07/2020 15.3  13.0 - 17.0 g/dL Final   HCT 11/91/478210/17/2022 45.1  39.0 - 52.0 % Final   MCV 12/07/2020 86.1  80.0 - 100.0 fL Final   MCH 12/07/2020 29.2  26.0 - 34.0 pg Final   MCHC 12/07/2020 33.9  30.0 - 36.0 g/dL Final   RDW 95/62/130810/17/2022 12.5  11.5 - 15.5 % Final   Platelets 12/07/2020 223  150 - 400 K/uL Final   nRBC 12/07/2020 0.0  0.0 - 0.2 % Final   Performed at [facility], [address] [place], [place] [address]   Magnesium 12/07/2020 2.5 (H)  1.7 - 2.4 mg/dL Final   Performed at [facility], [address] [place], [place] [address]   Phosphorus 12/07/2020 4.6  2.5 - 4.6 mg/dL Final   Performed at [facility], [address] [place], [place] [address]   Magnesium 12/06/2020 2.8 (H)  1.7 - 2.4 mg/dL Final   Performed at [facility], [address] [place], [place] [address]   Phosphorus 12/06/2020 6.3 (H)  2.5 - 4.6 mg/dL Final   Performed at [facility], [address] [place], [place] [address]   Troponin I (High Sensitivity) 12/06/2020 8  <18 ng/L Final   Comment: (NOTE) Elevated high sensitivity troponin I (hsTnI) values and significant  changes across serial measurements may suggest ACS but many other  chronic  and acute conditions are known to elevate hsTnI results.  Refer to the "Links" section for chest pain algorithms and additional  guidance. Performed at [facility], [address] [place], [place] [address]    HIV Screen 4th Generation wRfx 12/07/2020 Non Reactive  Non Reactive Final   Performed at [facility], [address] [place], [place] [address]   MRSA by PCR Next Gen 12/07/2020 NOT DETECTED  NOT DETECTED Final   Comment: (NOTE) The GeneXpert MRSA Assay (FDA approved for NASAL specimens only), is one component of a comprehensive MRSA colonization surveillance program. It is not intended to diagnose MRSA infection nor to guide or monitor treatment for MRSA infections. Test performance is not FDA approved in patients less than 30 years old. Performed at [facility], [address] [place], [place] [address]    Lactic Acid, Venous 12/07/2020 1.1  0.5 - 1.9 mmol/L Final   Performed at [facility], [address] [place], [place] [address]   Hemoglobin 12/07/2020 14.7  13.0 - 17.0 g/dL Final   HCT 64/33/295110/17/2022 42.9  39.0 - 52.0 % Final   Performed at [facility], [address] [place], [place] [address]   Total CK 12/07/2020 360  49 - 397 U/L Final   Performed at [facility], [address] [place], [place] [address]  Glucose-Capillary 12/07/2020 89  70 - 99 mg/dL Final   Glucose reference range applies only to samples taken after fasting for at least 8 hours.   Glucose-Capillary 12/07/2020 56 (L)  70 - 99 mg/dL Final   Glucose reference range applies only to samples taken after fasting for at least 8 hours.   Glucose-Capillary 12/07/2020 77  70 - 99 mg/dL Final   Glucose reference range applies only to samples taken after fasting for at least 8 hours.   Glucose-Capillary 12/07/2020 54 (L)  70 - 99 mg/dL Final   Glucose reference range applies only to samples taken after fasting for at least 8 hours.   WBC  12/08/2020 4.1  4.0 - 10.5 K/uL Final   RBC 12/08/2020 4.71  4.22 - 5.81 MIL/uL Final   Hemoglobin 12/08/2020 14.1  13.0 - 17.0 g/dL Final   HCT 12/08/2020 41.9  39.0 - 52.0 % Final   MCV 12/08/2020 89.0  80.0 - 100.0 fL Final   MCH 12/08/2020 29.9  26.0 - 34.0 pg Final   MCHC 12/08/2020 33.7  30.0 - 36.0 g/dL Final   RDW 12/08/2020 12.9  11.5 - 15.5 % Final   Platelets 12/08/2020 183  150 - 400 K/uL Final   nRBC 12/08/2020 0.0  0.0 - 0.2 % Final   Performed at [facility], [address] [place], [place] [address]   Sodium 12/08/2020 139  135 - 145 mmol/L Final   Potassium 12/08/2020 3.8  3.5 - 5.1 mmol/L Final   Chloride 12/08/2020 105  98 - 111 mmol/L Final   CO2 12/08/2020 29  22 - 32 mmol/L Final   Glucose, Bld 12/08/2020 106 (H)  70 - 99 mg/dL Final   Glucose reference range applies only to samples taken after fasting for at least 8 hours.   BUN 12/08/2020 12  6 - 20 mg/dL Final   Creatinine, Ser 12/08/2020 1.41 (H)  0.61 - 1.24 mg/dL Final   Calcium 12/08/2020 8.3 (L)  8.9 - 10.3 mg/dL Final   GFR, Estimated 12/08/2020 >60  >60 mL/min Final   Comment: (NOTE) Calculated using the CKD-EPI Creatinine Equation (2021)    Anion gap 12/08/2020 5  5 - 15 Final   Performed at [facility], [address] [place], [place] [address]   Magnesium 12/08/2020 2.2  1.7 - 2.4 mg/dL Final   Performed at [facility], [address] [place], [place] [address]   Phosphorus 12/08/2020 4.4  2.5 - 4.6 mg/dL Final   Performed at [facility], [name] [place],  [address]   Glucose-Capillary 12/07/2020 123 (H)  70 - 99 mg/dL Final   Glucose reference range applies only to samples taken after fasting for at least 8 hours.   Glucose-Capillary 12/08/2020 109 (H)  70 - 99 mg/dL Final   Glucose reference range applies only to samples taken after fasting for at least 8 hours.   Glucose-Capillary 12/08/2020 73  70 - 99 mg/dL Final   Glucose  reference range applies only to samples taken after fasting for at least 8 hours.   Glucose-Capillary 12/08/2020 98  70 - 99 mg/dL Final   Glucose reference range applies only to samples taken after fasting for at least 8 hours.   Glucose-Capillary 12/08/2020 92  70 - 99 mg/dL Final   Glucose reference range applies only to samples taken after fasting for at least 8 hours.   Glucose-Capillary 12/08/2020 103 (H)  70 - 99 mg/dL Final  Glucose reference range applies only to samples taken after fasting for at least 8 hours.   Glucose-Capillary 12/08/2020 112 (H)  70 - 99 mg/dL Final   Glucose reference range applies only to samples taken after fasting for at least 8 hours.   Glucose-Capillary 12/09/2020 98  70 - 99 mg/dL Final   Glucose reference range applies only to samples taken after fasting for at least 8 hours.   WBC 12/09/2020 5.3  4.0 - 10.5 K/uL Final   RBC 12/09/2020 5.20  4.22 - 5.81 MIL/uL Final   Hemoglobin 12/09/2020 15.7  13.0 - 17.0 g/dL Final   HCT 12/09/2020 45.9  39.0 - 52.0 % Final   MCV 12/09/2020 88.3  80.0 - 100.0 fL Final   MCH 12/09/2020 30.2  26.0 - 34.0 pg Final   MCHC 12/09/2020 34.2  30.0 - 36.0 g/dL Final   RDW 12/09/2020 12.8  11.5 - 15.5 % Final   Platelets 12/09/2020 189  150 - 400 K/uL Final   nRBC 12/09/2020 0.0  0.0 - 0.2 % Final   Neutrophils Relative % 12/09/2020 65  % Final   Neutro Abs 12/09/2020 3.4  1.7 - 7.7 K/uL Final   Lymphocytes Relative 12/09/2020 23  % Final   Lymphs Abs 12/09/2020 1.2  0.7 - 4.0 K/uL Final   Monocytes Relative 12/09/2020 11  % Final   Monocytes Absolute 12/09/2020 0.6  0.1 - 1.0 K/uL Final   Eosinophils Relative 12/09/2020 1  % Final   Eosinophils Absolute 12/09/2020 0.0  0.0 - 0.5 K/uL Final   Basophils Relative 12/09/2020 0  % Final   Basophils Absolute 12/09/2020 0.0  0.0 - 0.1 K/uL Final   Immature Granulocytes 12/09/2020 0  % Final   Abs Immature Granulocytes 12/09/2020 0.02  0.00 - 0.07 K/uL Final   Performed at  [facility], [place], [place] [address]   Sodium 12/09/2020 140  135 - 145 mmol/L Final   Potassium 12/09/2020 4.0  3.5 - 5.1 mmol/L Final   Chloride 12/09/2020 104  98 - 111 mmol/L Final   CO2 12/09/2020 28  22 - 32 mmol/L Final   Glucose, Bld 12/09/2020 94  70 - 99 mg/dL Final   Glucose reference range applies only to samples taken after fasting for at least 8 hours.   BUN 12/09/2020 9  6 - 20 mg/dL Final   Creatinine, Ser 12/09/2020 1.07  0.61 - 1.24 mg/dL Final   Calcium 12/09/2020 9.1  8.9 - 10.3 mg/dL Final   Total Protein 12/09/2020 6.8  6.5 - 8.1 g/dL Final   Albumin 12/09/2020 3.6  3.5 - 5.0 g/dL Final   AST 12/09/2020 90 (H)  15 - 41 U/L Final   ALT 12/09/2020 37  0 - 44 U/L Final   Alkaline Phosphatase 12/09/2020 62  38 - 126 U/L Final   Total Bilirubin 12/09/2020 0.7  0.3 - 1.2 mg/dL Final   GFR, Estimated 12/09/2020 >60  >60 mL/min Final   Comment: (NOTE) Calculated using the CKD-EPI Creatinine Equation (2021)    Anion gap 12/09/2020 8  5 - 15 Final   Performed at [facility], [place], [place] [address]   Magnesium 12/09/2020 2.0  1.7 - 2.4 mg/dL Final   Performed at [facility], [address] [place], [place] [address]   Phosphorus 12/09/2020 2.9  2.5 - 4.6 mg/dL Final   Performed at [facility], [address] [place], [place] [address]  Glucose-Capillary 12/09/2020 93  70 - 99 mg/dL Final   Glucose reference range applies only to samples taken after fasting for at least 8 hours.    Blood Alcohol level:  Lab Results  Component Value Date   ETH <10 04/07/2021   ETH <10 123456    Metabolic Disorder Labs: Lab Results  Component Value Date   HGBA1C 5.8 (H) 04/07/2021   MPG 119.76 04/07/2021   No results found for: PROLACTIN Lab Results  Component Value Date   CHOL 190 04/07/2021   TRIG 181 (H) 04/07/2021   HDL 46 04/07/2021   CHOLHDL 4.1 04/07/2021   VLDL 36 04/07/2021    LDLCALC 108 (H) 04/07/2021   LDLCALC 83 01/22/2019    Therapeutic Lab Levels: No results found for: LITHIUM No results found for: VALPROATE No components found for:  CBMZ  Physical Findings   AIMS    Flowsheet Row Admission (Discharged) from 01/29/2021 in [place] Admission (Discharged) from 01/21/2019 in [place] Total Score 0 0      AUDIT    [place] Admission (Discharged) from 01/29/2021 in [place] Admission (Discharged) from 01/21/2019 in [place]  Alcohol Use Disorder Identification Test Final Score (AUDIT) 1 3      PHQ2-9    [place] ED from 04/07/2021 in [facility] ED from 03/16/2021 in [place] DEPT  PHQ-2 Total Score 2 2  PHQ-9 Total Score 6 6      Flowsheet Row ED from 04/07/2021 in [facility] ED from 03/16/2021 in [place] DEPT ED from 03/09/2021 in [place] No Risk No Risk No Risk        Musculoskeletal  Strength & Muscle Tone: within normal limits Gait & Station: normal Patient leans: N/A  Psychiatric Specialty Exam  Presentation  General Appearance: Appropriate for Environment; Fairly Groomed (multiple facial tattoos)  Eye Contact:Fair  Speech:Clear and Coherent; Normal Rate  Speech Volume:Normal  Handedness:Right   Mood and Affect  Mood:Anxious  Affect:Blunt; Appropriate; Congruent   Thought Process  Thought Processes:Coherent; Goal Directed; Linear  Descriptions of Associations:Intact  Orientation:Full (Time, Place and Person)  Thought Content:Paranoid Ideation  Diagnosis of Schizophrenia or Schizoaffective disorder in past: Yes  Duration of Psychotic Symptoms: Greater than six months   Hallucinations:Hallucinations:  None  Ideas of Reference:Paranoia  Suicidal Thoughts:Suicidal Thoughts: No SI Passive Intent and/or Plan: Without Intent; Without Plan  Homicidal Thoughts:Homicidal Thoughts: Yes, Active HI Active Intent and/or Plan: Without Intent; Without Plan   Sensorium  Memory:Immediate Good; Recent Good; Remote Good  Judgment:Fair  Insight:Fair   Executive Functions  Concentration:Good  Attention Span:Good  Recall:Good  Fund of Knowledge:Good  Language:Good   Psychomotor Activity  Psychomotor Activity:Psychomotor Activity: Normal   Assets  Assets:Communication Skills; Desire for Improvement; Physical Health   Sleep  Sleep:Sleep: Fair   Nutritional Assessment (For OBS and FBC admissions only) Has the patient had a weight loss or gain of 10 pounds or more in the last 3 months?: No Has the patient had a decrease in food intake/or appetite?: No Does the patient have dental problems?: No Does the patient have eating habits or behaviors that may be indicators of an eating disorder including binging or inducing vomiting?: No Has the patient recently lost weight without trying?: 0 Has the patient been eating poorly because of a decreased appetite?: 0  Malnutrition Screening Tool Score: 0    Physical Exam  Physical Exam Constitutional:      Appearance: Normal appearance. He is normal weight.  HENT:     Head: Normocephalic and atraumatic.  Eyes:     Extraocular Movements: Extraocular movements intact.  Pulmonary:     Effort: Pulmonary effort is normal.  Neurological:     General: No focal deficit present.     Mental Status: He is alert and oriented to person, place, and time.  Psychiatric:        Attention and Perception: Attention and perception normal.        Speech: Speech normal.        Behavior: Behavior normal. Behavior is cooperative.        Thought Content: Thought content normal.   Review of Systems  Constitutional:  Negative for chills and fever.  HENT:   Negative for hearing loss.   Eyes:  Negative for discharge and redness.  Respiratory:  Negative for cough.   Cardiovascular:  Negative for chest pain.  Gastrointestinal:  Negative for abdominal pain.  Musculoskeletal:  Negative for myalgias.  Neurological:  Negative for headaches.  Psychiatric/Behavioral:  Positive for substance abuse and suicidal ideas.        Passive sI paranoia  Blood pressure 127/87, pulse 80, temperature 98.7 F (37.1 C), temperature source Oral, resp. rate 18, SpO2 96 %. There is no height or weight on file to calculate BMI.  Treatment Plan Summary: Lyam Lamotte is a 31 y.o. male with documented past psychiatric history significant for schizophreniform disorder, psychosis, cocaine abuse, cocaine-induced psychotic disorder with hallucinations, polysubstance abuse, and no significant past medical history, who presents to the [facility]) unaccompanied as a voluntary walk-in for HI and auditory hallucinations. He was transferred to [facility] on 04/07/21 for continues observation and restarted on his medications. UDS+buprenorphine and cocaine.   Patient reports ongoing paranoia, passive SI and HI and is unable to contract for safety. Patient is appropriate for inpatient admission- will seek placement.   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

## 2021-04-07 NOTE — ED Notes (Signed)
Pt has been sleeping all day except at meal times

## 2021-04-07 NOTE — Progress Notes (Signed)
Patient has been denied by [facility] due to no appropriate beds available. Patient meets [facility] inpatient per Dr. Bronwen Betters. Patient has been faxed out to the following facilities:    [facility]  [address], [place] [phone]  [facility]  [address], [place] [address] [phone]  [facility]  [address], [place] [address] [phone]  [facility]  [address] [place] [address] [phone]  [facility]  [address] [place], [place] [address] [phone]  [facility]  [address] [place], [place] [address] [phone]  [facility]  [address] [place] [address] [phone]  [facility]  [address] [place] [address] [phone]  [facility]  [address] [place] [address] [phone]  [facility]  [address] [place] [address] [phone]   Damita Dunnings, MSW, LCSW-A  11:13 AM 04/07/2021

## 2021-04-08 MED ORDER — FLUOXETINE HCL 20 MG PO CAPS: 20.0000 mg | ORAL_CAPSULE | Freq: Every day | ORAL | 0 refills | Status: AC

## 2021-04-08 MED ORDER — BENZTROPINE MESYLATE 0.5 MG PO TABS: 0.5000 mg | ORAL_TABLET | Freq: Every day | ORAL | 0 refills | Status: AC

## 2021-04-08 MED ORDER — OLANZAPINE 15 MG PO TABS: 15.0000 mg | ORAL_TABLET | Freq: Every day | ORAL | 1 refills | Status: DC

## 2021-04-08 NOTE — ED Notes (Signed)
Pt sleeping@this time. Breathing even and unlabored. Will continue to monitor for safety 

## 2021-04-08 NOTE — ED Notes (Signed)
An attempt was made to have pt sign vol.  Consent for [facility].Cl[place] says he does not want to go to [facility].  He says he is eligable to  go to sober living tomorrow morning.    Provider made aware.

## 2021-04-08 NOTE — ED Provider Notes (Signed)
FBC/OBS ASAP Discharge Summary  Date and Time: 04/08/2021 5:24 PM  Name: Bradley Casey  MRN:  [id number]   Discharge Diagnoses:  Final diagnoses:  Schizophreniform disorder (HCC)  Cocaine use disorder (HCC)    Subjective:  Patient seen and chart reviewed-he has been medication compliant and has been appropriate with staff and peers on the unit.  Denies SI/HI/AVH. He denies paranoia. He does not present as objectively psychotic or manic, TP is linear and organized.    He states that he feels safe for discharge today and requests to leave. He states that he works third shift and is scheduled to work [employer]  and reports that he plans on returning back to his sober living facility tomorrow.  Patient indicates that he had relapsed and was required by his sober living facility to detox for a certain amount of days prior to returning.  Patient states that he feels safe and ready to go and requests paper prescriptions for his medications .     Stay Summary:  Bradley Casey is a 31 y.o. male with documented past psychiatric history significant for schizophreniform disorder, psychosis, cocaine abuse, cocaine-induced psychotic disorder with hallucinations, polysubstance abuse, and no significant past medical history, who presents [facility] unaccompanied as a voluntary walk-in for HI and auditory hallucinations. He was transferred to [facility] on 04/07/21 for continuous observation .  UDS+buprenorphine and cocaine. Patient was restarted on his home medications of Zyprexa 15 mg nightly, Prozac 20 mg and Cogentin 0.5 mg upon admission to the [facility].  Patient was initially recommended for inpatient treatment; however, his symptoms resolved prior to acceptance and he requested discharge on 04/08/2021.  On my interview, day of discharge, patient is in NAD, alert, oriented, calm, cooperative, and attentive, with normal affect, speech, and behavior. Objectively, there is no evidence of  psychosis/ mania (able to converse coherently, linear and goal directed thought, no RIS, no distractibility, not pre-occupied, no FOI, etc) nor depression to the point of suicidality (able to concentrate, affect full and reactive, speech normal r/v/t, no psychomotor retardation/agitation, etc).  Overall, patient appears to be at the point, in the absence of inhibiting or disinhibiting symptoms, where he can successfully move to lesser restrictive setting for care.  Total Time spent with patient: 15 minutes  Past Psychiatric History: hizophreniform disorder, psychosis, cocaine abuse, cocaine-induced psychotic disorder with hallucinations, polysubstance abuse, Past Medical History:  Past Medical History:  Diagnosis Date   Schizophrenia (HCC)    No past surgical history on file. Family History:  Family History  Family history unknown: Yes   Family Psychiatric History: see H&P Social History:  Social History   Substance and Sexual Activity  Alcohol Use Not Currently     Social History   Substance and Sexual Activity  Drug Use Not Currently   Types: Marijuana, Cocaine    Social History   Socioeconomic History   Marital status: Single    Spouse name: Not on file   Number of children: Not on file   Years of education: Not on file   Highest education level: Not on file  Occupational History   Not on file  Tobacco Use   Smoking status: Some Days    Types: Cigars   Smokeless tobacco: Never  Vaping Use   Vaping Use: Some days   Substances: Nicotine, Flavoring  Substance and Sexual Activity   Alcohol use: Not Currently   Drug use: Not Currently    Types: Marijuana, Cocaine  Sexual activity: Yes  Other Topics Concern   Not on file  Social History Narrative   Not on file   Social Determinants of Health   Financial Resource Strain: Not on file  Food Insecurity: Not on file  Transportation Needs: Not on file  Physical Activity: Not on file  Stress: Not on file  Social  Connections: Not on file   SDOH:  SDOH Screenings   Alcohol Screen: Low Risk    Last Alcohol Screening Score (AUDIT): 1  Depression (PHQ2-9): Medium Risk   PHQ-2 Score: 6  Financial Resource Strain: Not on file  Food Insecurity: Not on file  Housing: Not on file  Physical Activity: Not on file  Social Connections: Not on file  Stress: Not on file  Tobacco Use: High Risk   Smoking Tobacco Use: Some Days   Smokeless Tobacco Use: Never   Passive Exposure: Not on file  Transportation Needs: Not on file    Tobacco Cessation:  Prescription not provided because: n/a  Current Medications:  Current Facility-Administered Medications  Medication Dose Route Frequency Provider Last Rate Last Admin   acetaminophen (TYLENOL) tablet 650 mg  650 mg Oral Q6H PRN Prescilla Sours, PA-C       alum & mag hydroxide-simeth (MAALOX/MYLANTA) 200-200-20 MG/5ML suspension 30 mL  30 mL Oral Q4H PRN Margorie John W, PA-C       benztropine (COGENTIN) tablet 0.5 mg  0.5 mg Oral Daily Margorie John W, PA-C   0.5 mg at 04/08/21 0943   FLUoxetine (PROZAC) capsule 20 mg  20 mg Oral Daily Margorie John W, PA-C   20 mg at 04/08/21 0941   hydrOXYzine (ATARAX) tablet 25 mg  25 mg Oral TID PRN Prescilla Sours, PA-C   25 mg at 04/07/21 2109   magnesium hydroxide (MILK OF MAGNESIA) suspension 30 mL  30 mL Oral Daily PRN Margorie John W, PA-C       OLANZapine (ZYPREXA) tablet 15 mg  15 mg Oral QHS Margorie John W, PA-C   15 mg at 04/07/21 2109   Current Outpatient Medications  Medication Sig Dispense Refill   benztropine (COGENTIN) 0.5 MG tablet Take 1 tablet (0.5 mg total) by mouth daily. 30 tablet 0   FLUoxetine (PROZAC) 20 MG capsule Take 1 capsule (20 mg total) by mouth daily. 30 capsule 0   OLANZapine (ZYPREXA) 15 MG tablet Take 1 tablet (15 mg total) by mouth at bedtime. 30 tablet 1    PTA Medications: (Not in a hospital admission)   Musculoskeletal  Strength & Muscle Tone: within normal limits Gait & Station:  normal Patient leans: N/A  Psychiatric Specialty Exam  Presentation  General Appearance: Appropriate for Environment; Fairly Groomed (multiple facial tattoos)  Eye Contact:Fair  Speech:Clear and Coherent  Speech Volume:Normal  Handedness:Right   Mood and Affect  Mood:Euthymic  Affect:Appropriate; Congruent; Constricted   Thought Process  Thought Processes:Coherent; Goal Directed; Linear  Descriptions of Associations:Intact  Orientation:Full (Time, Place and Person)  Thought Content:WDL; Logical  Diagnosis of Schizophrenia or Schizoaffective disorder in past: Yes  Duration of Psychotic Symptoms: Greater than six months   Hallucinations:Hallucinations: None  Ideas of Reference:None  Suicidal Thoughts:Suicidal Thoughts: No   Homicidal Thoughts:Homicidal Thoughts: No    Sensorium  Memory:Immediate Good; Recent Good; Remote Good  Judgment:Fair  Insight:Fair   Executive Functions  Concentration:Good  Attention Span:Good  [place] of Knowledge:Good  Language:Good   Psychomotor Activity  Psychomotor Activity:Psychomotor Activity: Normal   Assets  Assets:Communication Skills;  Desire for Improvement; Physical Health   Sleep  Sleep:Sleep: Fair   Nutritional Assessment (For OBS and FBC admissions only) Has the patient had a weight loss or gain of 10 pounds or more in the last 3 months?: No Has the patient had a decrease in food intake/or appetite?: No Does the patient have dental problems?: No Does the patient have eating habits or behaviors that may be indicators of an eating disorder including binging or inducing vomiting?: No Has the patient recently lost weight without trying?: 0 Has the patient been eating poorly because of a decreased appetite?: 0 Malnutrition Screening Tool Score: 0    Physical Exam  Physical Exam Constitutional:      Appearance: Normal appearance. He is normal weight.  HENT:     Head: Normocephalic and  atraumatic.  Eyes:     Extraocular Movements: Extraocular movements intact.  Pulmonary:     Effort: Pulmonary effort is normal.  Neurological:     General: No focal deficit present.     Mental Status: He is alert and oriented to person, place, and time.  Psychiatric:        Attention and Perception: Attention and perception normal.        Speech: Speech normal.        Behavior: Behavior normal. Behavior is cooperative.        Thought Content: Thought content normal.   Review of Systems  Constitutional:  Negative for chills and fever.  HENT:  Negative for hearing loss.   Eyes:  Negative for discharge and redness.  Respiratory:  Negative for cough.   Cardiovascular:  Negative for chest pain.  Gastrointestinal:  Negative for abdominal pain.  Musculoskeletal:  Negative for myalgias.  Neurological:  Negative for headaches.  Psychiatric/Behavioral:  Positive for substance abuse. Negative for depression, hallucinations and suicidal ideas.   Blood pressure (!) 142/85, pulse 93, temperature 98.2 F (36.8 C), temperature source Oral, resp. rate 18, SpO2 98 %. There is no height or weight on file to calculate BMI.  Demographic Factors:  Male and Low socioeconomic status  Loss Factors: Financial problems/change in socioeconomic status  Historical Factors: Impulsivity  Risk Reduction Factors:   Employed, Positive social support, Positive therapeutic relationship, and Positive coping skills or problem solving skills, future oriented-focused on returning to work  Continued Clinical Symptoms:  Alcohol/Substance Abuse/Dependencies Schizophrenia:   Less than 83 years old  Cognitive Features That Contribute To Risk:  None    Suicide Risk:  Minimal: No identifiable suicidal ideation.  Patients presenting with no risk factors but with morbid ruminations; may be classified as minimal risk based on the severity of the depressive symptoms  Plan Of Care/Follow-up recommendations:  Activity:   as tolerated Diet:  regular Other:    Take all medications as prescribed by his/her mental healthcare provider. Report any adverse effects and or reactions from the medicines to your outpatient provider promptly. Do not engage in alcohol and or illegal drug use while on prescription medicines. In the event of worsening symptoms, call the crisis hotline, 911 and or go to the nearest ED for appropriate evaluation and treatment of symptoms. follow-up with your primary care provider for your other medical issues, concerns and or health care needs.  Allergies as of 04/08/2021   No Known Allergies      Medication List     TAKE these medications    benztropine 0.5 MG tablet Commonly known as: COGENTIN Take 1 tablet (0.5 mg total) by mouth daily.  FLUoxetine 20 MG capsule Commonly known as: PROZAC Take 1 capsule (20 mg total) by mouth daily.   OLANZapine 15 MG tablet Commonly known as: ZYPREXA Take 1 tablet (15 mg total) by mouth at bedtime. What changed: when to take this        Patient provided 30 day paper scripts for above medications   Disposition: self care  Ival Bible, MD 04/08/2021, 5:24 PM

## 2021-04-08 NOTE — Progress Notes (Signed)
Patient has been denied by [facility] due to no appropriate beds available. Patient meets [facility] inpatient criteria per Dr. Bronwen Betters. Patient has been faxed out to the following facilities:    [facility]  [address], [place] [address] [phone]  [facility]  [address], [place] [address] [phone]  [facility]  [address] [place] [address] [phone]  [facility]  [address] [place] [address] [phone]  [facility]  [address] [place], [place] [address] [phone]  [facility]  [address] [place], [place] [address] [phone]  [facility]  [address] [place] [address] [phone]  [facility]  [address] [place] [address] [phone]  [facility]  [address], [place] [address] [phone]  Damita Dunnings, MSW, LCSW-A  10:12 AM 04/08/2021

## 2021-04-08 NOTE — Discharge Instructions (Signed)
Take all medications as prescribed by his/her mental healthcare provider. °Report any adverse effects and or reactions from the medicines to your outpatient provider promptly. °Do not engage in alcohol and or illegal drug use while on prescription medicines. °In the event of worsening symptoms, call the crisis hotline, 911 and or go to the nearest ED for appropriate evaluation and treatment of symptoms. °follow-up with your primary care provider for your other medical issues, concerns and or health care needs. ° ° ° ° °Please come to [facility] (this facility) during walk in hours for appointment with psychiatrist for further medication management and for therapists for therapy.  ° ° Walk in hours are 8-11 AM Monday through Thursday for medication management. Therapy walk in hours are Monday-Wednesday 8 AM-1PM.   It is first come, first -serve; it is best to arrive by 7:00 AM.  ° °On Friday from 1 pm to 4 pm for therapy intake only. Please arrive by 12:00 pm as it is  first come, first -serve.   ° °When you arrive please go upstairs for your appointment. If you are unsure of where to go, inform the front desk that you are here for a walk in appointment and they will assist you with directions upstairs. ° °Address:  °[address], in [place], [address] °Ph: [phone]  ° °

## 2021-04-08 NOTE — ED Notes (Signed)
Pt is awake and alert.  He is currently on the phone trying to call [facility].

## 2021-04-08 NOTE — ED Notes (Signed)
Pt was given a sandwich and a drink.

## 2021-04-08 NOTE — ED Notes (Signed)
Pt awakens to commands. He currently denies SI, HI or AVH.  Pt has blunted affect but good eye contact.  Took medications without prompting.  Redirectable at this time.  Staff will monitor for safety.

## 2023-01-31 IMAGING — CT CT CHEST-ABD-PELV W/ CM
2 of 6 series · 13 of 36 positions shown, 15 images · IV contrast (omnipaque)
Comparison: None.
COMPARISON: None.

Addendum:
CLINICAL DATA: Trauma, jumped from 2nd story building

EXAM:
CT CHEST, ABDOMEN, AND PELVIS WITH CONTRAST
TECHNIQUE: Multidetector CT imaging of the chest, abdomen and pelvis was
performed following the standard protocol during bolus
administration of intravenous contrast.
CONTRAST:  80mL OMNIPAQUE IOHEXOL 350 MG/ML SOLN

[Series 4: cap with · axial · 0.82mm/px · z∈[-893,-353]mm · 10 of 133 slices shown, 12 images]
[im 13/133  mediastinal]
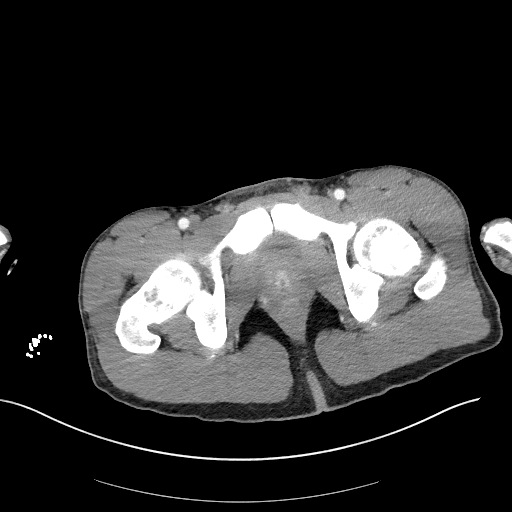
[im 13/133  bone]
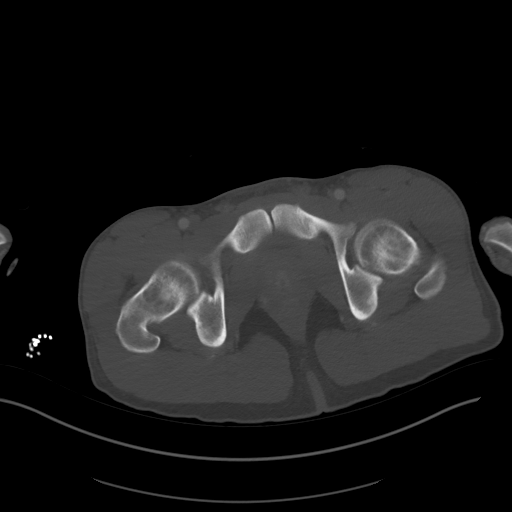
[im 25/133  mediastinal]
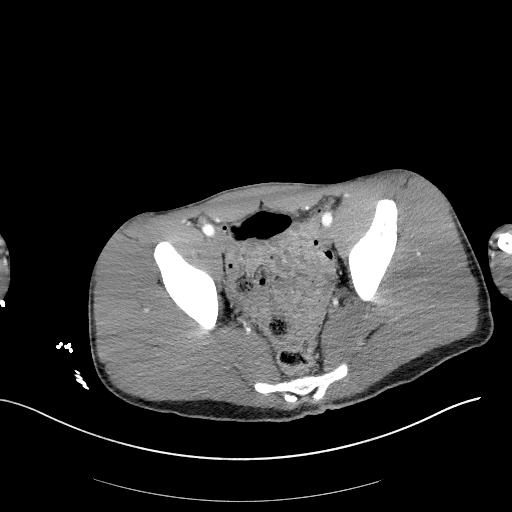
[im 37/133  mediastinal]
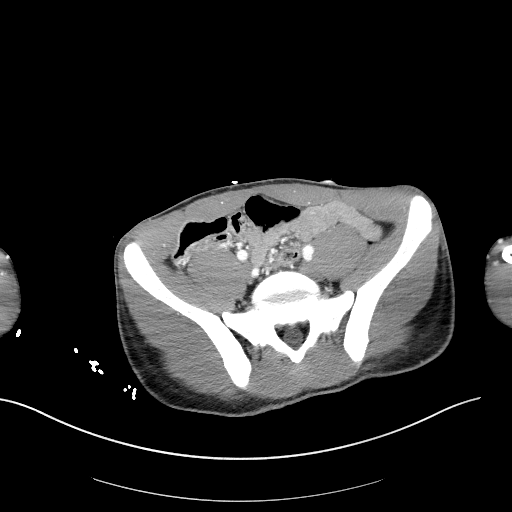
[im 49/133  mediastinal]
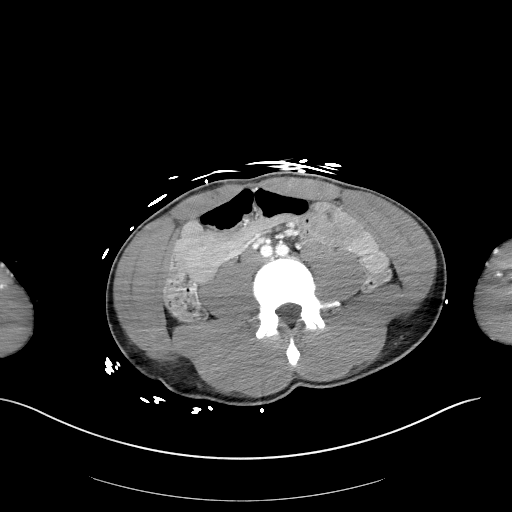
[im 61/133  mediastinal]
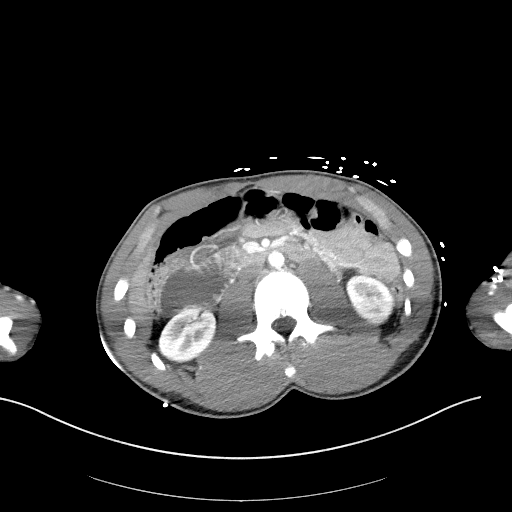
[im 73/133  mediastinal]
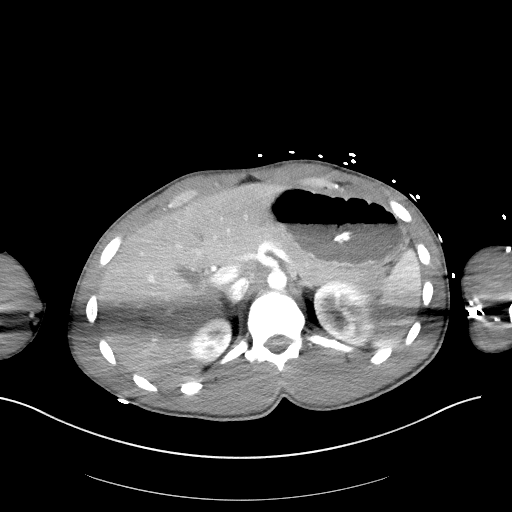
[im 85/133  mediastinal]
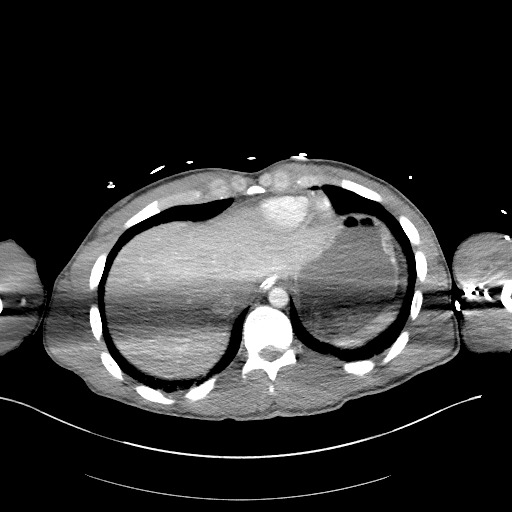
[im 97/133  mediastinal]
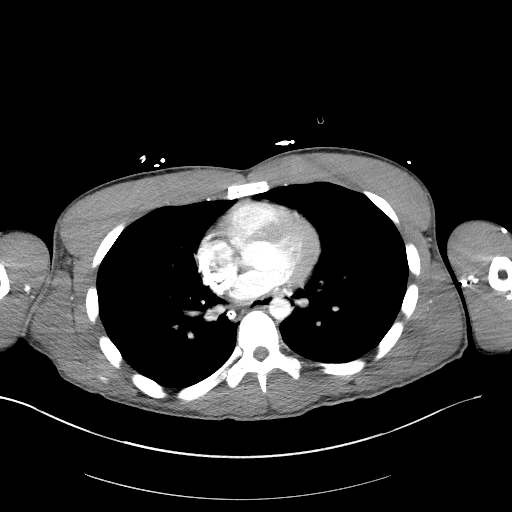
[im 109/133  mediastinal]
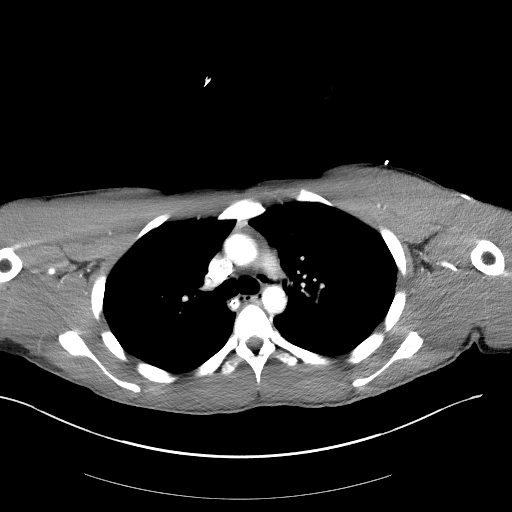
[im 109/133  bone]
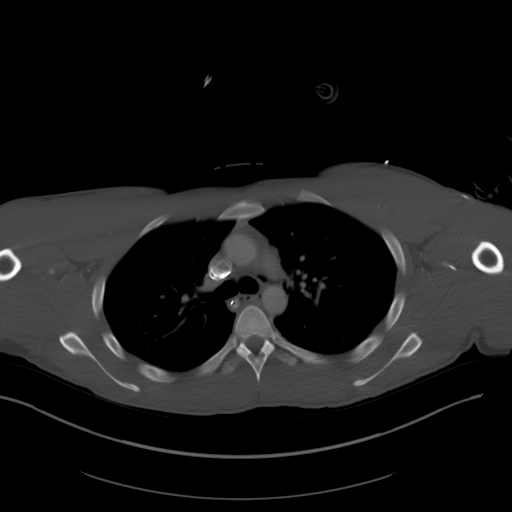
[im 121/133  mediastinal]
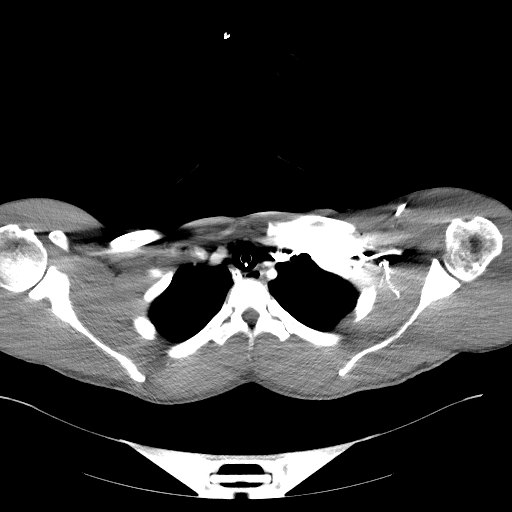

[Series 7: coronals · coronal · 0.74mm/px · 3 of 125 slices shown]
[im 25/125  mediastinal]
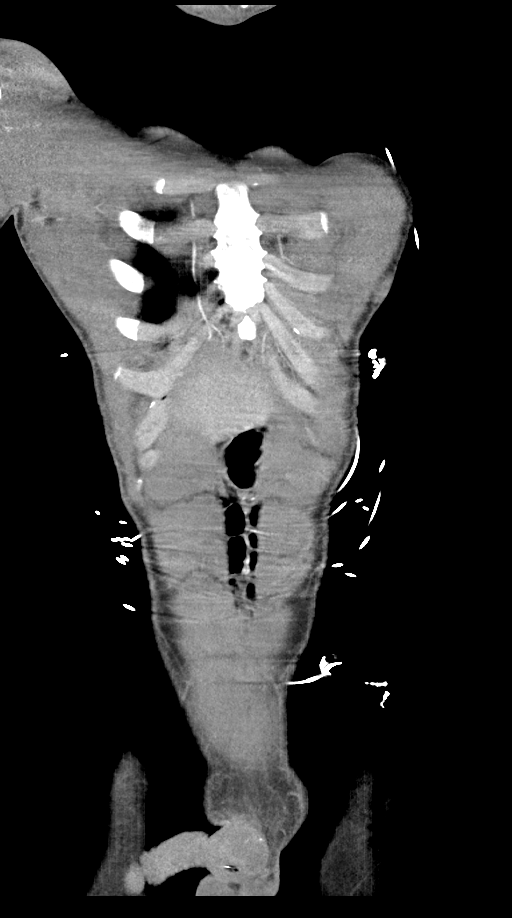
[im 50/125  mediastinal]
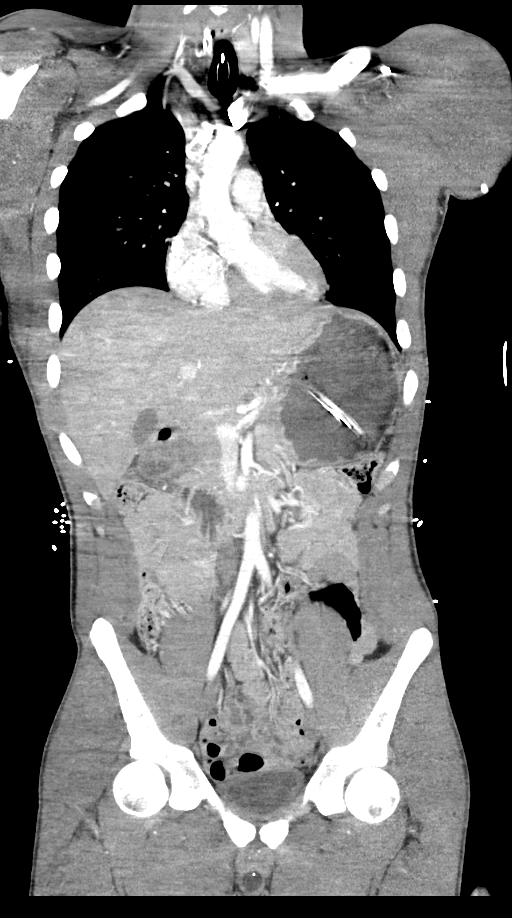
[im 75/125  mediastinal]
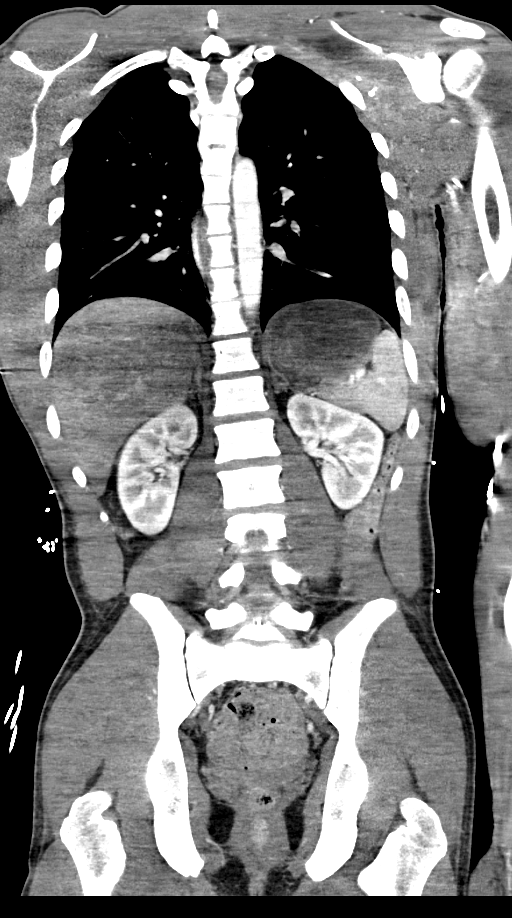

[13 of 36 positions shown; findings below may reference images not displayed]

FINDINGS: CT CHEST FINDINGS

Cardiovascular: No evidence of traumatic aortic injury.

The heart is normal in size.  No pericardial effusion.

Mediastinum/Nodes: No evidence of anterior mediastinal hematoma.

No suspicious mediastinal lymphadenopathy.

Visualized thyroid is unremarkable.

Lungs/Pleura: Endotracheal tube terminates 2.5 cm above the carina.

Lungs are essentially clear.

No focal consolidation.

No suspicious pulmonary nodules.

No pleural effusion or pneumothorax.

Musculoskeletal: No fracture is seen. Sternum, clavicles, scapulae,
and bilateral ribs are intact. Thoracic spine is within normal
limits.

CT ABDOMEN PELVIS FINDINGS

Hepatobiliary: Liver is within normal limits. No perihepatic
fluid/hemorrhage.

Gallbladder is unremarkable. No intrahepatic or extrahepatic ductal
dilatation.

Pancreas: Within normal limits.

Spleen: Within normal limits.  No perisplenic fluid/hemorrhage.

Adrenals/Urinary Tract: Adrenal glands are within normal limits.

Kidneys are within normal limits.  No hydronephrosis.

Bladder is mildly thick-walled although underdistended.

Stomach/Bowel: Enteric tube terminates in the proximal stomach.

No evidence of bowel obstruction.

Normal appendix (series 4/image 108).

Vascular/Lymphatic: No evidence of abdominal aortic aneurysm.

No suspicious abdominopelvic lymphadenopathy.

Reproductive: Prostate is unremarkable.

Other: No abdominopelvic ascites.

No hemoperitoneum or free air.

Musculoskeletal: No fracture is seen. Lumbar spine, pelvis, and
proximal femurs are intact.
IMPRESSION: No evidence of traumatic injury to the chest, abdomen, or pelvis.

Endotracheal tube terminates 2.5 cm above the carina.

Enteric tube terminates in the proximal stomach.

ADDENDUM:
On additional review, the patient has a Foley catheter which is
inflated within the membranous portion of the urethra, below the
level of the prostate. This is not located within the bladder.

*** End of Addendum ***
FINDINGS: CT CHEST FINDINGS

Cardiovascular: No evidence of traumatic aortic injury.

The heart is normal in size.  No pericardial effusion.

Mediastinum/Nodes: No evidence of anterior mediastinal hematoma.

No suspicious mediastinal lymphadenopathy.

Visualized thyroid is unremarkable.

Lungs/Pleura: Endotracheal tube terminates 2.5 cm above the carina.

Lungs are essentially clear.

No focal consolidation.

No suspicious pulmonary nodules.

No pleural effusion or pneumothorax.

Musculoskeletal: No fracture is seen. Sternum, clavicles, scapulae,
and bilateral ribs are intact. Thoracic spine is within normal
limits.

CT ABDOMEN PELVIS FINDINGS

Hepatobiliary: Liver is within normal limits. No perihepatic
fluid/hemorrhage.

Gallbladder is unremarkable. No intrahepatic or extrahepatic ductal
dilatation.

Pancreas: Within normal limits.

Spleen: Within normal limits.  No perisplenic fluid/hemorrhage.

Adrenals/Urinary Tract: Adrenal glands are within normal limits.

Kidneys are within normal limits.  No hydronephrosis.

Bladder is mildly thick-walled although underdistended.

Stomach/Bowel: Enteric tube terminates in the proximal stomach.

No evidence of bowel obstruction.

Normal appendix (series 4/image 108).

Vascular/Lymphatic: No evidence of abdominal aortic aneurysm.

No suspicious abdominopelvic lymphadenopathy.

Reproductive: Prostate is unremarkable.

Other: No abdominopelvic ascites.

No hemoperitoneum or free air.

Musculoskeletal: No fracture is seen. Lumbar spine, pelvis, and
proximal femurs are intact.
IMPRESSION: No evidence of traumatic injury to the chest, abdomen, or pelvis.

Endotracheal tube terminates 2.5 cm above the carina.

Enteric tube terminates in the proximal stomach.

## 2023-02-01 IMAGING — DX DG CHEST 1V PORT
1 series · 1 of 1 positions shown · non-contrast
Comparison: 9079

CLINICAL DATA: Fall

EXAM:
PORTABLE CHEST 1 VIEW

[chest ap]
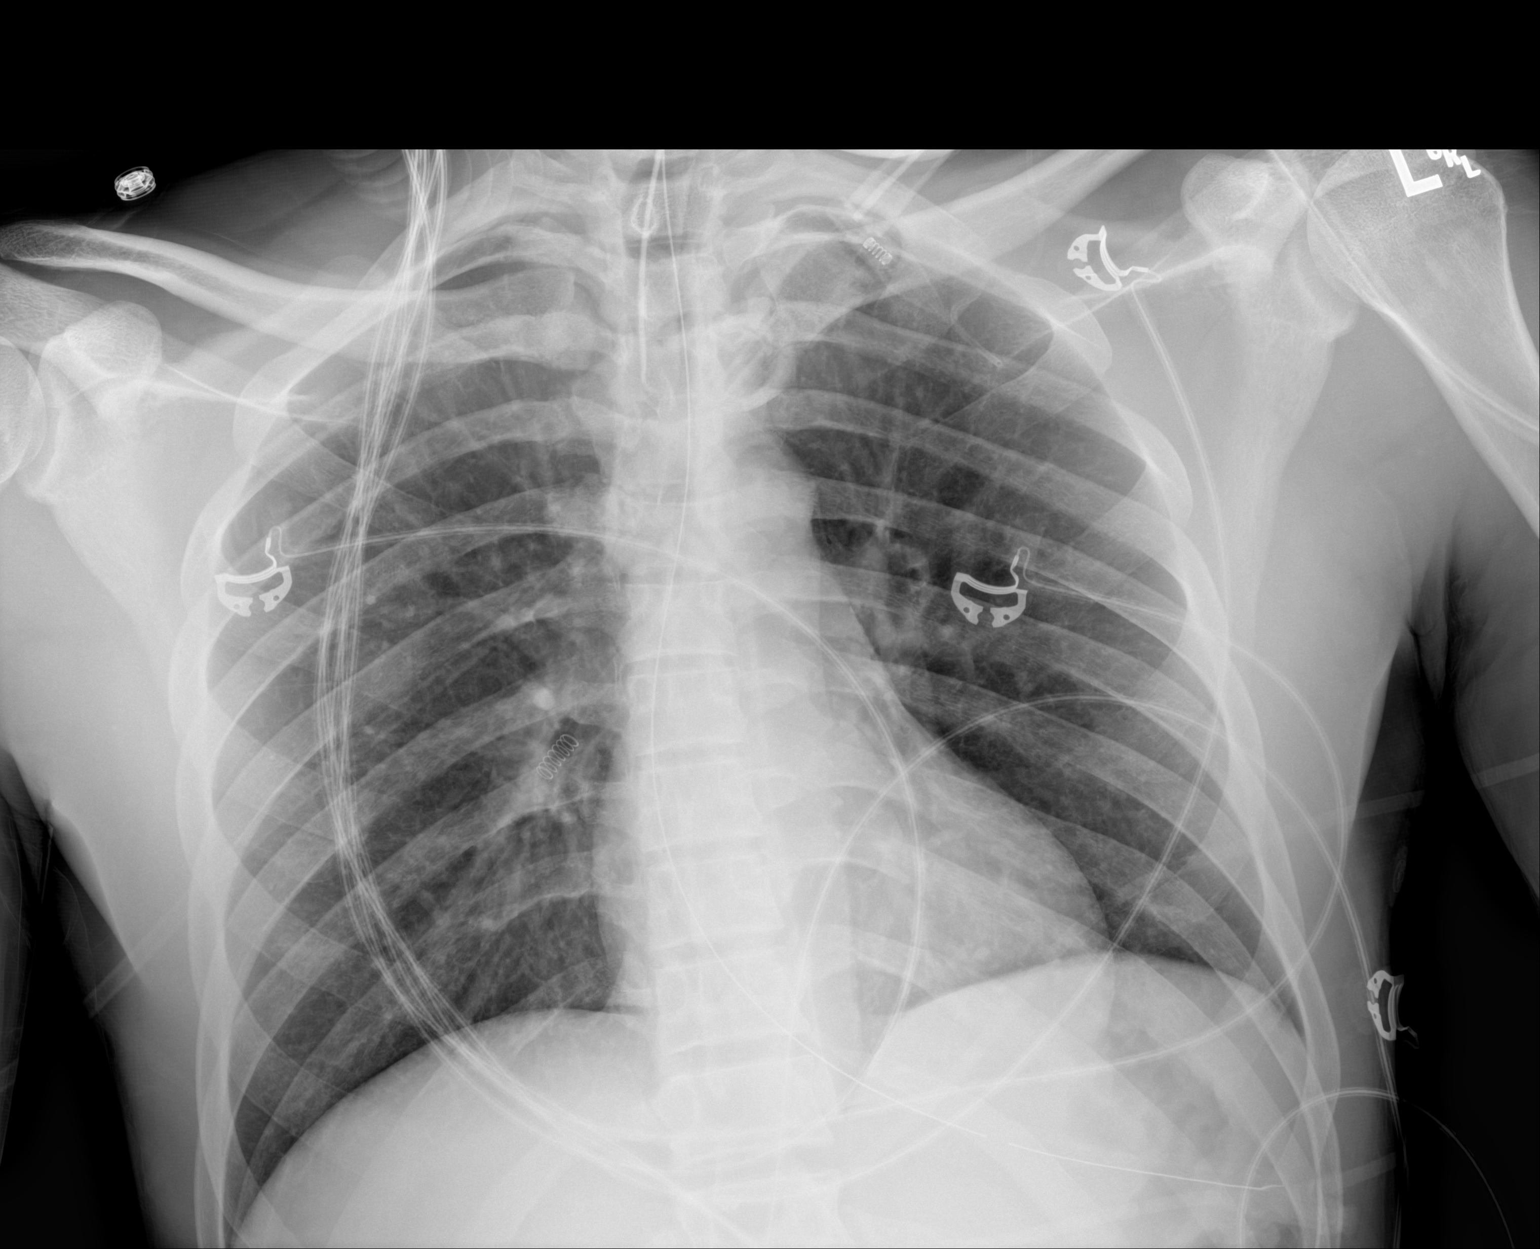

[1 of 1 positions shown; findings below may reference images not displayed]

FINDINGS: Endotracheal and enteric tubes in satisfactory position. The heart
size and mediastinal contours are within normal limits. Both lungs
are clear. No pleural effusion or pneumothorax. The visualized
skeletal structures are unremarkable.
IMPRESSION: No acute process in the chest.

## 2023-04-27 ENCOUNTER — Other Ambulatory Visit: Payer: Self-pay

## 2023-04-27 ENCOUNTER — Ambulatory Visit
Admission: EM | Admit: 2023-04-27 | Discharge: 2023-04-27 | Disposition: A | Discharge status: Home / Self Care | Attending: Emergency Medicine | Admitting: Emergency Medicine

## 2023-04-27 ENCOUNTER — Encounter: Payer: Self-pay | Admitting: Emergency Medicine

## 2023-04-27 DIAGNOSIS — K130 Diseases of lips: Secondary | ICD-10-CM | POA: Diagnosis not present

## 2023-04-27 MED ORDER — DOXYCYCLINE HYCLATE 100 MG PO CAPS: 100.0000 mg | ORAL_CAPSULE | Freq: Two times a day (BID) | ORAL | 0 refills | Status: DC

## 2023-04-27 NOTE — ED Triage Notes (Signed)
 Patient presents to [facility] for evaluation of a pimple-like growth on the skin above his upper lip that popped with purulent discharge.  The next day, his upper lip was swollen when he woke up.  Swelling is going down some, but the area above his lip has been crusting over and he has been peeling the crust off/re-irritating it.  Denies fevers.  Has taken Benadryl and Tylenol with some relief.

## 2023-04-27 NOTE — Discharge Instructions (Signed)
 Take doxycycline every morning and every evening for 7 days to clear out the germ contributing to your symptoms  Continue use of ice, Tylenol and Benadryl in addition to the antibiotic  Hold warm-hot compresses to affected area at least 4 times a day, this helps to facilitate draining, the more the better  Please return for evaluation for increased swelling, increased tenderness or pain, non healing site, non draining site, you begin to have fever or chills   We reviewed the etiology of recurrent abscesses of skin.  Skin abscesses are collections of pus within the dermis and deeper skin tissues. Skin abscesses manifest as painful, tender, fluctuant, and erythematous nodules, frequently surmounted by a pustule and surrounded by a rim of erythematous swelling.  Spontaneous drainage of purulent material may occur.  Fever can occur on occasion.    -Skin abscesses can develop in healthy individuals with no predisposing conditions other than skin or nasal carriage of Staphylococcus aureus.  Individuals in close contact with others who have active infection with skin abscesses are at increased risk which is likely to explain why twin brother has similar episodes.   In addition, any process leading to a breach in the skin barrier can also predispose to the development of a skin abscesses, such as atopic dermatitis.

## 2023-04-27 NOTE — ED Provider Notes (Signed)
 [facility]    CSN: [id number] Arrival date & time: 04/27/23  1344      History   Chief Complaint Chief Complaint  Patient presents with   Oral Swelling    HPI Bradley Casey is a 33 y.o. male.   Patient presents for evaluation of a bump present to the upper lip for 3 days.  Appear to be a pimple in which she attempted to pop expelling puslike drainage.  Since then has progressively worsened and upper lip has become swollen and hard.  Has attempted use of Benadryl, ice and Tylenol which has been somewhat helpful but symptoms have not resolved.  Denies fever.  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 No pertinent surgical history.     Home Medications    Prior to Admission medications   Medication Sig Start Date End Date Taking? Authorizing Provider  doxycycline (VIBRAMYCIN) 100 MG capsule Take 1 capsule (100 mg total) by mouth 2 (two) times daily. 04/27/23  Yes Marlee Armenteros R, NP  benztropine (COGENTIN) 0.5 MG tablet Take 1 tablet (0.5 mg total) by mouth daily. 04/08/21   Estella Husk, MD  FLUoxetine (PROZAC) 20 MG capsule Take 1 capsule (20 mg total) by mouth daily. 04/08/21   Estella Husk, MD  OLANZapine (ZYPREXA) 15 MG tablet Take 1 tablet (15 mg total) by mouth at bedtime. 04/08/21   Estella Husk, MD    Family History Family History  Family history unknown: Yes    Social History Social History   Tobacco Use   Smoking status: Some Days    Types: Cigars   Smokeless tobacco: Never  Vaping Use   Vaping status: Some Days   Substances: Nicotine, Flavoring  Substance Use Topics   Alcohol use: Not Currently   Drug use: Not Currently    Types: Marijuana, Cocaine     Allergies   Patient has no known allergies.   Review of  Systems Review of Systems   Physical Exam Triage Vital Signs ED Triage Vitals [04/27/23 1442]  Encounter Vitals Group     BP (!) 185/129     Systolic BP Percentile      Diastolic BP Percentile      Pulse Rate 78     Resp 16     Temp 97.6 F (36.4 C)     Temp Source Temporal     SpO2 96 %     Weight      Height      Head Circumference      Peak Flow      Pain Score      Pain Loc      Pain Education      Exclude from Growth Chart    No data found.  Updated Vital Signs BP (!) 185/129 (BP Location: Left Arm)   Pulse 78   Temp 97.6 F (36.4 C) (Temporal)   Resp 16   SpO2 96%   Visual Acuity Right Eye Distance:   Left Eye Distance:   Bilateral Distance:    Right Eye Near:   Left Eye Near:    Bilateral Near:     Physical Exam Constitutional:      Appearance: Normal appearance.  HENT:     Mouth/Throat:  Comments: 1 x 1 cm immature erythematous and tender abscess present to the center of the upper lip ,moderate upper lip swelling Eyes:     Extraocular Movements: Extraocular movements intact.  Pulmonary:     Effort: Pulmonary effort is normal.  Neurological:     Mental Status: He is alert and oriented to person, place, and time. Mental status is at baseline.      UC Treatments / Results  Labs (all labs ordered are listed, but only abnormal results are displayed) Labs Reviewed - No data to display  EKG   Radiology No results found.  Procedures Procedures (including critical care time)  Medications Ordered in UC Medications - No data to display  Initial Impression / Assessment and Plan / UC Course  I have reviewed the triage vital signs and the nursing notes.  Pertinent labs & imaging results that were available during my care of the patient were reviewed by me and considered in my medical decision making (see chart for details).  Abscess lip  Presentation consistent with infection, discussed findings, prescribed doxycycline recommended  continued supportive measures, advised follow-up with urgent care for nonhealing site Final Clinical Impressions(s) / UC Diagnoses   Final diagnoses:  Abscess, lip     Discharge Instructions      Take doxycycline every morning and every evening for 7 days to clear out the germ contributing to your symptoms  Continue use of ice, Tylenol and Benadryl in addition to the antibiotic  Hold warm-hot compresses to affected area at least 4 times a day, this helps to facilitate draining, the more the better  Please return for evaluation for increased swelling, increased tenderness or pain, non healing site, non draining site, you begin to have fever or chills   We reviewed the etiology of recurrent abscesses of skin.  Skin abscesses are collections of pus within the dermis and deeper skin tissues. Skin abscesses manifest as painful, tender, fluctuant, and erythematous nodules, frequently surmounted by a pustule and surrounded by a rim of erythematous swelling.  Spontaneous drainage of purulent material may occur.  Fever can occur on occasion.    -Skin abscesses can develop in healthy individuals with no predisposing conditions other than skin or nasal carriage of Staphylococcus aureus.  Individuals in close contact with others who have active infection with skin abscesses are at increased risk which is likely to explain why twin brother has similar episodes.   In addition, any process leading to a breach in the skin barrier can also predispose to the development of a skin abscesses, such as atopic dermatitis.      ED Prescriptions     Medication Sig Dispense Auth. Provider   doxycycline (VIBRAMYCIN) 100 MG capsule Take 1 capsule (100 mg total) by mouth 2 (two) times daily. 14 capsule Jamaris Theard, Elita Boone, NP      PDMP not reviewed this encounter.   Valinda Hoar, NP 04/27/23 1527

## 2023-07-12 ENCOUNTER — Ambulatory Visit
Admission: EM | Admit: 2023-07-12 | Discharge: 2023-07-12 | Disposition: A | Discharge status: Home / Self Care | Payer: Self-pay | Attending: Emergency Medicine | Admitting: Emergency Medicine

## 2023-07-12 DIAGNOSIS — R220 Localized swelling, mass and lump, head: Secondary | ICD-10-CM

## 2023-07-12 DIAGNOSIS — K047 Periapical abscess without sinus: Secondary | ICD-10-CM

## 2023-07-12 MED ORDER — AMOXICILLIN-POT CLAVULANATE 875-125 MG PO TABS: 1.0000 | ORAL_TABLET | Freq: Two times a day (BID) | ORAL | 0 refills | Status: DC

## 2023-07-12 NOTE — ED Triage Notes (Signed)
 Patient to Urgent Care with complaints of left sided, lower dental pain. Also w/ left sided ear pain.  Symptoms started yesterday. Woke up this morning with jaw swelling.  Taking ibuprofen and using oral-gel.

## 2023-07-12 NOTE — ED Provider Notes (Signed)
 Bradley Casey    CSN: [id number] Arrival date & time: 07/12/23  0804      History   Chief Complaint Chief Complaint  Patient presents with   Dental Pain    HPI Bradley Casey is a 33 y.o. male.  Patient presents with left lower toothache, left ear pain, left lower facial swelling x 1 day.  Treating with ibuprofen and Orajel.  No fever, difficulty swallowing, difficulty breathing.  The history is provided by the patient and medical records.    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 No pertinent surgical history.     Home Medications    Prior to Admission medications   Medication Sig Start Date End Date Taking? Authorizing Provider  amoxicillin-clavulanate (AUGMENTIN) 875-125 MG tablet Take 1 tablet by mouth every 12 (twelve) hours. 07/12/23  Yes Wellington Half, NP  benztropine  (COGENTIN ) 0.5 MG tablet Take 1 tablet (0.5 mg total) by mouth daily. Patient not taking: Reported on 07/12/2023 04/08/21   Rema Care, MD  FLUoxetine  (PROZAC ) 20 MG capsule Take 1 capsule (20 mg total) by mouth daily. Patient not taking: Reported on 07/12/2023 04/08/21   Rema Care, MD  OLANZapine  (ZYPREXA ) 15 MG tablet Take 1 tablet (15 mg total) by mouth at bedtime. Patient not taking: Reported on 07/12/2023 04/08/21   Rema Care, MD    Family History Family History  Family history unknown: Yes    Social History Social History   Tobacco Use   Smoking status: Some Days    Types: Cigars   Smokeless tobacco: Never  Vaping Use   Vaping status: Some Days   Substances: Nicotine , Flavoring  Substance Use Topics   Alcohol use: Not Currently   Drug use: Yes    Types: Marijuana     Allergies   Patient has no known allergies.   Review of  Systems Review of Systems  Constitutional:  Negative for chills and fever.  HENT:  Positive for dental problem, ear pain and facial swelling. Negative for sore throat, trouble swallowing and voice change.   Respiratory:  Negative for cough and shortness of breath.      Physical Exam Triage Vital Signs ED Triage Vitals  Encounter Vitals Group     BP      Systolic BP Percentile      Diastolic BP Percentile      Pulse      Resp      Temp      Temp src      SpO2      Weight      Height      Head Circumference      Peak Flow      Pain Score      Pain Loc      Pain Education      Exclude from Growth Chart    No data found.  Updated Vital Signs BP 131/83   Pulse 78   Temp 98.4 F (36.9 C)   Resp 18   SpO2 96%   Visual Acuity Right Eye Distance:   Left Eye Distance:   Bilateral Distance:    Right Eye Near:   Left Eye Near:    Bilateral Near:     Physical Exam Constitutional:  General: He is not in acute distress. HENT:     Nose: Nose normal.     Mouth/Throat:     Mouth: Mucous membranes are moist.     Dentition: Dental tenderness present. No dental caries.     Pharynx: Oropharynx is clear.      Comments: Swelling along left lower jaw line. No erythema.  Cardiovascular:     Rate and Rhythm: Normal rate and regular rhythm.  Pulmonary:     Effort: Pulmonary effort is normal. No respiratory distress.  Skin:    General: Skin is warm and dry.     Findings: No erythema or lesion.  Neurological:     Mental Status: He is alert.      UC Treatments / Results  Labs (all labs ordered are listed, but only abnormal results are displayed) Labs Reviewed - No data to display  EKG   Radiology No results found.  Procedures Procedures (including critical care time)  Medications Ordered in UC Medications - No data to display  Initial Impression / Assessment and Plan / UC Course  I have reviewed the triage vital signs and the nursing notes.  Pertinent  labs & imaging results that were available during my care of the patient were reviewed by me and considered in my medical decision making (see chart for details).    Dental abscess, left facial swelling.  Afebrile and vital signs are stable.  No difficulty swallowing or breathing.  Treating with Augmentin.  Dental resource guide provided along with education on dental abscess.  Instructed patient to schedule an appointment with a dentist as soon as possible.  ED precautions discussed for worsening symptoms.  Patient agrees to plan of care.  Final Clinical Impressions(s) / UC Diagnoses   Final diagnoses:  Dental abscess  Left facial swelling     Discharge Instructions      Take the antibiotic as prescribed.    A dental resource guide is attached.  Please call to make an appointment with a dentist as soon as possible.    Go to the emergency department if you have worsening symptoms.      ED Prescriptions     Medication Sig Dispense Auth. Provider   amoxicillin-clavulanate (AUGMENTIN) 875-125 MG tablet Take 1 tablet by mouth every 12 (twelve) hours. 14 tablet Wellington Half, NP      PDMP not reviewed this encounter.   Wellington Half, NP 07/12/23 [phone]

## 2023-07-12 NOTE — Discharge Instructions (Signed)
Take the antibiotic as prescribed.    A dental resource guide is attached.  Please call to make an appointment with a dentist as soon as possible.    Go to the emergency department if you have worsening symptoms.

## 2023-07-19 ENCOUNTER — Telehealth: Payer: Self-pay | Admitting: Emergency Medicine

## 2023-07-19 MED ORDER — CLINDAMYCIN HCL 300 MG PO CAPS: 300.0000 mg | ORAL_CAPSULE | Freq: Three times a day (TID) | ORAL | 0 refills | Status: AC

## 2023-07-19 NOTE — Telephone Encounter (Signed)
 Return to clinic endorses that abscess has not fully resolved, completed Augmentin as directed, prescribed clindamycin, advised patient to schedule follow-up appointment in a week with dentist

## 2023-10-24 ENCOUNTER — Other Ambulatory Visit: Payer: Self-pay

## 2023-10-24 ENCOUNTER — Emergency Department
Admission: EM | Admit: 2023-10-24 | Discharge: 2023-10-24 | Disposition: A | Discharge status: Home / Self Care | Payer: Self-pay | Attending: Emergency Medicine | Admitting: Emergency Medicine

## 2023-10-24 DIAGNOSIS — L02416 Cutaneous abscess of left lower limb: Secondary | ICD-10-CM | POA: Insufficient documentation

## 2023-10-24 DIAGNOSIS — L03116 Cellulitis of left lower limb: Secondary | ICD-10-CM | POA: Insufficient documentation

## 2023-10-24 LAB — COMPREHENSIVE METABOLIC PANEL WITH GFR
ALT: 9 U/L (ref 0–44)
AST: 21 U/L (ref 15–41)
Albumin: 2.9 g/dL — ABNORMAL LOW (ref 3.5–5.0)
Alkaline Phosphatase: 56 U/L (ref 38–126)
Anion gap: 10 (ref 5–15)
BUN: 9 mg/dL (ref 6–20)
CO2: 28 mmol/L (ref 22–32)
Calcium: 8.3 mg/dL — ABNORMAL LOW (ref 8.9–10.3)
Chloride: 104 mmol/L (ref 98–111)
Creatinine, Ser: 0.97 mg/dL (ref 0.61–1.24)
GFR, Estimated: >60 mL/min (ref >60)
Glucose, Bld: 119 mg/dL — ABNORMAL HIGH (ref 70–99)
Potassium: 3.9 mmol/L (ref 3.5–5.1)
Sodium: 142 mmol/L (ref 135–145)
Total Bilirubin: 0.3 mg/dL (ref 0.0–1.2)
Total Protein: 5.6 g/dL — ABNORMAL LOW (ref 6.5–8.1)

## 2023-10-24 LAB — CBC WITH DIFFERENTIAL/PLATELET
Abs Immature Granulocytes: 0.01 K/uL (ref 0.00–0.07)
Basophils Absolute: 0 K/uL (ref 0.0–0.1)
Basophils Relative: 0 %
Eosinophils Absolute: 0.1 K/uL (ref 0.0–0.5)
Eosinophils Relative: 1 %
HCT: 50.7 % (ref 39.0–52.0)
Hemoglobin: 17 g/dL (ref 13.0–17.0)
Immature Granulocytes: 0 %
Lymphocytes Relative: 23 %
Lymphs Abs: 1.7 K/uL (ref 0.7–4.0)
MCH: 29.6 pg (ref 26.0–34.0)
MCHC: 33.5 g/dL (ref 30.0–36.0)
MCV: 88.3 fL (ref 80.0–100.0)
Monocytes Absolute: 0.5 K/uL (ref 0.1–1.0)
Monocytes Relative: 7 %
Neutro Abs: 5 K/uL (ref 1.7–7.7)
Neutrophils Relative %: 69 %
Platelets: 247 K/uL (ref 150–400)
RBC: 5.74 MIL/uL (ref 4.22–5.81)
RDW: 12.1 % (ref 11.5–15.5)
WBC: 7.2 K/uL (ref 4.0–10.5)
nRBC: 0 % (ref 0.0–0.2)

## 2023-10-24 MED ORDER — CEPHALEXIN 500 MG PO CAPS: 500.0000 mg | ORAL_CAPSULE | Freq: Four times a day (QID) | ORAL | 0 refills | Status: AC

## 2023-10-24 NOTE — ED Provider Notes (Signed)
 [facility] Provider Note    Event Date/Time   First MD Initiated Contact with Patient 10/24/23 1523     (approximate)   History   Abscess   HPI  Bradley Casey is a 33 y.o. male with PMH of schizophrenia and polysubstance abuse who presents for evaluation of an abscess to the left thigh.  Patient states that the pain began a few days ago.  He thought it was a pimple.  He reports an increase in his pain yesterday and noted that this morning it had started draining blood.  Patient denies fevers.      Physical Exam   Triage Vital Signs: ED Triage Vitals  Encounter Vitals Group     BP 10/24/23 1428 (!) 115/99     Girls Systolic BP Percentile --      Girls Diastolic BP Percentile --      Boys Systolic BP Percentile --      Boys Diastolic BP Percentile --      Pulse Rate 10/24/23 1420 98     Resp 10/24/23 1420 18     Temp 10/24/23 1428 98.5 F (36.9 C)     Temp src --      SpO2 10/24/23 1420 99 %     Weight 10/24/23 1421 160 lb (72.6 kg)     Height 10/24/23 1421 5' 11 (1.803 m)     Head Circumference --      Peak Flow --      Pain Score 10/24/23 1420 7     Pain Loc --      Pain Education --      Exclude from Growth Chart --     Most recent vital signs: Vitals:   10/24/23 1420 10/24/23 1428  BP:  (!) 115/99  Pulse: 98   Resp: 18   Temp:  98.5 F (36.9 C)  SpO2: 99%    General: Awake, no distress.  CV:  Good peripheral perfusion.  Resp:  Normal effort.  Abd:  No distention.  Other:  Raised area to the distal left thigh with surrounding erythema, warmth and induration with small amount of bloody/purulent drainage, very tender to palpation.   ED Results / Procedures / Treatments   Labs (all labs ordered are listed, but only abnormal results are displayed) Labs Reviewed  COMPREHENSIVE METABOLIC PANEL WITH GFR - Abnormal; Notable for the following components:      Result Value   Glucose, Bld 119 (*)    Calcium 8.3 (*)    Total  Protein 5.6 (*)    Albumin 2.9 (*)    All other components within normal limits  CBC WITH DIFFERENTIAL/PLATELET     PROCEDURES:  Critical Care performed: No  .Ultrasound ED Soft Tissue  Date/Time: 10/24/2023 4:16 PM  Performed by: Cleaster Tinnie LABOR, PA-C Authorized by: Cleaster Tinnie LABOR, PA-C   Procedure details:    Indications: localization of abscess and evaluate for cellulitis     Transverse view:  Visualized   Longitudinal view:  Visualized   Images: not archived   Location:    Location: lower extremity     Side:  Left Findings:     no abscess present    cellulitis present    MEDICATIONS ORDERED IN ED: Medications - No data to display   IMPRESSION / MDM / ASSESSMENT AND PLAN / ED COURSE  I reviewed the triage vital signs and the nursing notes.  33 year old male presents for evaluation of an abscess to the left thigh.  Blood pressure is a little bit elevated otherwise vital signs are stable.  The patient mildly uncomfortable on exam.  Differential diagnosis includes, but is not limited to, cellulitis, abscess, folliculitis.  Patient's presentation is most consistent with acute complicated illness / injury requiring diagnostic workup.  CBC is unremarkable without leukocytosis, CMP shows elevated glucose otherwise unremarkable.  Area of concern evaluated using bedside ultrasound.  Did not identify a clear pocket of fluid. Do not feel I&D is indicated. Believe this area was an abscess but has begun to drain.  There was some surrounding cellulitis on ultrasound so I will prescribe patient a short course of oral antibiotics. Advised warm compresses.  Patient voiced understanding, all questions were answered and he was stable for discharge.     FINAL CLINICAL IMPRESSION(S) / ED DIAGNOSES   Final diagnoses:  Cellulitis and abscess of left leg     Rx / DC Orders   ED Discharge Orders          Ordered    cephALEXin  (KEFLEX ) 500 MG  capsule  4 times daily        10/24/23 1621             Note:  This document was prepared using Dragon voice recognition software and may include unintentional dictation errors.   Cleaster Tinnie LABOR, PA-C 10/24/23 1621    Waymond Lorelle Cummins, MD 10/24/23 [phone]

## 2023-10-24 NOTE — Discharge Instructions (Signed)
 Please apply warm compresses over the area.  This will help keep the tissue soft and will encourage it to continue to drain.  Wash with soap and water  daily and cover with gauze or bandage to prevent any additional drainage from getting on your clothing.  Please take the antibiotics as prescribed.  Watch for worsening infection which would appear as spreading redness, warmth, swelling, pain and increased pus drainage.  If you develop any of these please return to the ED, urgent care or your primary care provider.

## 2023-10-24 NOTE — ED Notes (Signed)
Basic labs sent. °

## 2023-10-24 NOTE — ED Triage Notes (Signed)
 Pt comes in via pov with complaints of left thigh paint that started a couple days ago. PT states that he noticed the area a couple days ago. The area started hurting yesterday, and woke up this morning with it bleeding and draining. Area is red, and swollen with bloody drainage. The area is swollen, hard ad painful to the touch. Pt complains of pain 7/10 with ambulation.

## 2024-01-04 ENCOUNTER — Other Ambulatory Visit: Payer: Self-pay

## 2024-01-04 ENCOUNTER — Emergency Department
Admission: EM | Admit: 2024-01-04 | Discharge: 2024-01-05 | Disposition: A | Discharge status: Home / Self Care | Payer: 59 | Attending: Emergency Medicine | Admitting: Emergency Medicine

## 2024-01-04 DIAGNOSIS — F39 Unspecified mood [affective] disorder: Secondary | ICD-10-CM | POA: Insufficient documentation

## 2024-01-04 DIAGNOSIS — F1590 Other stimulant use, unspecified, uncomplicated: Secondary | ICD-10-CM | POA: Insufficient documentation

## 2024-01-04 DIAGNOSIS — F29 Unspecified psychosis not due to a substance or known physiological condition: Secondary | ICD-10-CM | POA: Insufficient documentation

## 2024-01-04 DIAGNOSIS — F17290 Nicotine dependence, other tobacco product, uncomplicated: Secondary | ICD-10-CM | POA: Insufficient documentation

## 2024-01-04 DIAGNOSIS — Z8659 Personal history of other mental and behavioral disorders: Secondary | ICD-10-CM | POA: Insufficient documentation

## 2024-01-04 DIAGNOSIS — Z79899 Other long term (current) drug therapy: Secondary | ICD-10-CM

## 2024-01-04 DIAGNOSIS — Z760 Encounter for issue of repeat prescription: Secondary | ICD-10-CM | POA: Insufficient documentation

## 2024-01-04 NOTE — ED Notes (Signed)
 PT BELONGINGS: Hotel Manager Socks Phone Keys  Lighter $15 and one nickel cash Debit card  PT dressed out with Engineer, Maintenance (it)

## 2024-01-04 NOTE — BH Assessment (Signed)
 Comprehensive Clinical Assessment (CCA) Screening, Triage and Referral Note  01/04/2024 Bradley Casey [id number] Recommendations for Services/Supports/Treatments: [facility] consult/Disposition pending. Bradley Casey is a 33 year old, English speaking, Black male. Pt is voluntary. Per triage note: Patient ambulatory to triage with complaints of needing his zyprexa  refilled. Patient states he has not been on it in years but can feel an episode coming on so wants to get back on it.  Chief Complaint Patient reports experiencing paranoia and abnormal perceptions. History of Present Illness Upon assessment, patient endorsed paranoid delusions and abnormal mentation, stating: "I don't know how to explain it, I keep seeing spooky shit." Patient described visual experiences of men pulling guns at a gas station and expressed beliefs that his child's mother has men hiding under her bed or peeking from behind doors. Patient was suspicious of this writer taking notes during assessment. Patient endorsed anxiety symptoms and reluctantly admitted to needing medication. BAL: unremarkable; UDS pending. Patient does not currently receive psychiatric services and resides with his child's mother. Mental Status Examination Appearance: Bizarre Behavior: Guarded; fleeting eye contact Speech: Normal rate and tone Orientation: Oriented 4 Mood: Anxious Affect: Congruent Thought Process: Disorganized at times Thought Content: Paranoid delusions present; impaired reality testing Perception: Patient reluctantly endorsed hallucinations, stating: "It's hard to explain, but when you're seeing stuff like that." Insight/Judgment: Poor insight Impulse Control: Fair Risk Assessment Suicidal Ideation: Denied Homicidal Ideation: Denied Visual Hallucinations: Denied Auditory Hallucinations: Endorsed vaguely Delusions: Paranoid delusions present Substance Use BAL: Unremarkable UDS: Pending Psychosocial Factors Lives with  child's mother No current psychiatric services Stressors include relationship dynamics and possible environmental triggers Plan Complete UDS and review results Initiate psychiatric evaluation for psychosis and anxiety symptoms Consider medication management for psychotic symptoms Provide supportive counseling and psychoeducation Assess safety and need for higher level of care if symptoms persist or worsen Chief Complaint:  Chief Complaint  Patient presents with   Medication Refill   Visit Diagnosis:  1.)  Unspecified mood/affective disorder 2.)  Hx of Schizophrenia   Patient Reported Information How did you hear about us ? No data recorded What Is the Reason for Your Visit/Call Today? No data recorded How Long Has This Been Causing You Problems? No data recorded What Do You Feel Would Help You the Most Today? No data recorded  Have You Recently Had Any Thoughts About Hurting Yourself? No data recorded Are You Planning to Commit Suicide/Harm Yourself At This time? No data recorded  Have you Recently Had Thoughts About Hurting Someone Sherral? No data recorded Are You Planning to Harm Someone at This Time? No data recorded Explanation: No data recorded  Have You Used Any Alcohol or Drugs in the Past 24 Hours? No data recorded How Long Ago Did You Use Drugs or Alcohol? No data recorded What Did You Use and How Much? No data recorded  Do You Currently Have a Therapist/Psychiatrist? No data recorded Name of Therapist/Psychiatrist: No data recorded  Have You Been Recently Discharged From Any Office Practice or Programs? No data recorded Explanation of Discharge From Practice/Program: No data recorded   CCA Screening Triage Referral Assessment Type of Contact: No data recorded Telemedicine Service Delivery:   Is this Initial or Reassessment?   Date Telepsych consult ordered in [facility]:    Time Telepsych consult ordered in [facility]:    Location of Assessment: No data recorded Provider  Location: No data recorded   Collateral Involvement: No data recorded  Does Patient Have a Court Appointed Legal Guardian? No data recorded Name and Contact  of Legal Guardian: No data recorded If Minor and Not Living with Parent(s), Who has Custody? No data recorded Is CPS involved or ever been involved? No data recorded Is APS involved or ever been involved? No data recorded  Patient Determined To Be At Risk for Harm To Self or Others Based on Review of Patient Reported Information or Presenting Complaint? No data recorded Method: No data recorded Availability of Means: No data recorded Intent: No data recorded Notification Required: No data recorded Additional Information for Danger to Others Potential: No data recorded Additional Comments for Danger to Others Potential: No data recorded Are There Guns or Other Weapons in Your Home? No data recorded Types of Guns/Weapons: No data recorded Are These Weapons Safely Secured?                            No data recorded Who Could Verify You Are Able To Have These Secured: No data recorded Do You Have any Outstanding Charges, Pending Court Dates, Parole/Probation? No data recorded Contacted To Inform of Risk of Harm To Self or Others: No data recorded  Does Patient Present under Involuntary Commitment? No data recorded   [place] of Residence: No data recorded  Patient Currently Receiving the Following Services: No data recorded  Determination of Need: No data recorded  Options For Referral: No data recorded  Disposition Recommendation per psychiatric provider: Pending psych consult.  Jamaia Brum R Teniola Tseng, LCAS

## 2024-01-04 NOTE — ED Triage Notes (Signed)
 Patient ambulatory to triage with complaints of needing his zyprexa  refilled. Patient states he has not been on it in years but can feel an episode coming on so wants to get back on it.

## 2024-01-04 NOTE — ED Provider Notes (Signed)
 [facility] Provider Note    Event Date/Time   First MD Initiated Contact with Patient 01/04/24 2133     (approximate)   History   Medication Refill   HPI  Bradley Casey is a 33 y.o. male with PMH of schizophrenia, polysubstance abuse and cocaine induced psychotic disorder with hallucinations who presents for a refill on his Zyprexa .  Patient states that it has been a few years since he has had the medication but he feels like there is another episode coming on which is why he would like to take the medication.  He denies SI, HI and hallucinations.  Denies depression, anxiety and paranoia. Denies chest pain, shortness of breath, headache, vision changes, nausea, vomiting, diarrhea.  patient endorses cocaine use today and alcohol use earlier this week but not today.  Denies other drug use.      Physical Exam   Triage Vital Signs: ED Triage Vitals  Encounter Vitals Group     BP 01/04/24 2008 (!) 180/122     Girls Systolic BP Percentile --      Girls Diastolic BP Percentile --      Boys Systolic BP Percentile --      Boys Diastolic BP Percentile --      Pulse Rate 01/04/24 2006 (!) 118     Resp 01/04/24 2006 20     Temp 01/04/24 2006 (!) 97.5 F (36.4 C)     Temp Source 01/04/24 2006 Oral     SpO2 01/04/24 2006 99 %     Weight 01/04/24 2007 160 lb (72.6 kg)     Height 01/04/24 2007 6' (1.829 m)     Head Circumference --      Peak Flow --      Pain Score 01/04/24 2006 0     Pain Loc --      Pain Education --      Exclude from Growth Chart --     Most recent vital signs: Vitals:   01/04/24 2006 01/04/24 2008  BP:  (!) 180/122  Pulse: (!) 118   Resp: 20   Temp: (!) 97.5 F (36.4 C)   SpO2: 99%    General: Awake, no distress.  CV:  Good peripheral perfusion.  RRR. Resp:  Normal effort.  CTAB Abd:  No distention.  Other:     ED Results / Procedures / Treatments   Labs (all labs ordered are listed, but only abnormal results are  displayed) Labs Reviewed - No data to display   PROCEDURES:  Critical Care performed: No  Procedures   MEDICATIONS ORDERED IN ED: Medications - No data to display   IMPRESSION / MDM / ASSESSMENT AND PLAN / ED COURSE  I reviewed the triage vital signs and the nursing notes.                             33 year old male with PMH of schizophrenia presents for medication refill.  Blood pressure and heart rate are elevated.  Patient NAD on exam.  Differential diagnosis includes, but is not limited to, medication refill,  Patient's presentation is most consistent with acute, uncomplicated illness.  I asked patient to explain what he means when he feels like there is another episode coming and he is unable to describe exactly what he means.  He denies SI, HI and hallucinations.  He states that people around him have been acting weird.  He does not  feel anxious or depressed.  I reviewed patient's chart and he has not been seen by a designated behavioral health provider since 2023.  I explained to the patient that I not able to simply give him a refill for the Zyprexa  as I am not a mental health dedicated provider and he has not been on this medication for a few years.  I offered for him to speak with psychiatry in the hospital as well as giving him an as needed medication for anxiety.  Patient would prefer to speak with psychiatry.  Will plan to pass his care off to the nighttime provider pending psychiatric evaluation.  At this point in time I feel he is medically stable for psychiatric evaluation and I do not feel that he meets criteria for IVC.      FINAL CLINICAL IMPRESSION(S) / ED DIAGNOSES   Final diagnoses:  Medication management     Rx / DC Orders   ED Discharge Orders     None        Note:  This document was prepared using Dragon voice recognition software and may include unintentional dictation errors.   Cleaster Tinnie LABOR, PA-C 01/04/24 2324    Dorothyann Drivers, MD 01/08/24 2225

## 2024-01-05 DIAGNOSIS — F1590 Other stimulant use, unspecified, uncomplicated: Secondary | ICD-10-CM

## 2024-01-05 DIAGNOSIS — F29 Unspecified psychosis not due to a substance or known physiological condition: Secondary | ICD-10-CM

## 2024-01-05 MED ORDER — OLANZAPINE 5 MG PO TABS: 5.0000 mg | ORAL_TABLET | Freq: Every day | ORAL | 1 refills | Status: AC

## 2024-01-05 NOTE — Consult Note (Signed)
 [facility] Consult Note  Patient Name: Bradley Casey MRN: [id number] DOB: 27-Aug-1990 DATE OF Consult: 01/05/2024  PRIMARY PSYCHIATRIC DIAGNOSES  1.  Unspecified psychosis  2.  Stimulant use disorder 3.  Rule out substance induced psychosis    RECOMMENDATIONS  Recommendations: Medication recommendations: Recommend olanzapine  5mg  po nightly for psychosis. Risks, benefits, alternatives, side effects discussed, patient questions answered Non-Medication/therapeutic recommendations: Resources for outpatient therapy, psychiatry, substance use, primary care; crisis line information; ED return precautions  Is inpatient psychiatric hospitalization recommended for this patient? No (Explain why): Denies suicidal and homicidal ideation, able to provide for his own basic needs, declines voluntary admission and does not meet criteria for involuntary admission  From a psychiatric perspective, is this patient appropriate for discharge to an outpatient setting/resource or other less restrictive environment for continued care?  Yes (Explain why): As above  Follow-Up Telepsychiatry C/L services: We will sign off for now. Please re-consult our service if needed for any concerning changes in the patient's condition, discharge planning, or questions. Communication: Treatment team members (and family members if applicable) who were involved in treatment/care discussions and planning, and with whom we spoke or engaged with via secure text/chat, include the following: Team via [employer] chat   On evaluation, patient noted to be cooperative, anxious, linear to direct questioning with some underlying thought disorganization, oddly related with delusional thought content, alert and oriented x 3. Patient with some paranoid delusional thought content. Denies auditory and visual hallucinations, symptoms consistent with mania/hypomania, depression, suicidal and homicidal ideation. He reports cocaine use 4 times per week, last  use yesterday or the day before. Declines voluntary psychiatric hospitalization and does not currently meet criteria for involuntary psychiatric hospitalization. Recommendations as above.    Thank you for involving us  in the care of this patient. If you have any additional questions or concerns, please call [phone] and ask for me or the provider on-call.  TELEPSYCHIATRY ATTESTATION & CONSENT  As the provider for this telehealth consult, I attest that I verified the patient's identity using two separate identifiers, introduced myself to the patient, provided my credentials, disclosed my location, and performed this encounter via a HIPAA-compliant, real-time, face-to-face, two-way, interactive audio and video platform and with the full consent and agreement of the patient (or guardian as applicable.)  Patient physical location: ED in [facility]  Telehealth provider physical location: home office in state of [place]    Video start time: 924 AM EST Video end time: 938 AM EST    IDENTIFYING DATA  Bradley Casey is a 33 y.o. year-old male for whom a psychiatric consultation has been ordered by the primary provider. The patient was identified using two separate identifiers.  CHIEF COMPLAINT/REASON FOR CONSULT  Medication refill    HISTORY OF PRESENT ILLNESS (HPI)  Bradley Casey is a 33 year old male with a history of stimulant use disorder, substance induced psychosis and substance induced mood disorder, unspecified psychosis, schizophrenia who presents to the ED for a refill of Zyprexa  due to feeling an episode coming on. Chart reviewed. No UDS resulted. Psychiatry consulted for evaluation and management.   On evaluation, patient noted to be cooperative, anxious, linear to direct questioning with some underlying thought disorganization, oddly related with delusional thought content, alert and oriented x 3. Patient states I just came to get a prescription. I went to urgent care to get  it and they were having construction. Patient states weird stuff is happening. Patient states cars stopping at green lights and busting u turns. He states,  People are trying to get me, gang stalkers staring at me and all that bullshit. He states, It is spooky or whatever, but I'm used to it. I know I need meds though. It's just the usual I guess. Patient denies auditory and visual hallucinations. Patient denies depressed mood, other depressive symptoms. Patient denies symptoms consistent with mania/hypomania, homicidal ideation. Patient denies suicidal ideation, intent, plan. Patient reports he uses cocaine regularly, about 4 times per week, states he last used yesterday or the day before. Patient denies the use of other drugs and alcohol. Patient reports he was diagnosed with schizophrenia. Patient reports he was taking Zyprexa  7.5mg  I stopped taking it because it was too strong. It had me zombie-ish a little bit. He reports the medication helped his thoughts to slow down. States he has not been on medication for a few years. Patient reports he has been staying with friends and family on couches. Denies current employment. Denies family history of mental illness. Patient declines voluntary psychiatric hospitalization. States, I just came here to get medication.    PAST PSYCHIATRIC HISTORY  Trauma/abuse/neglect/exploitation: Denies Suicide attempts: Prior psychiatric hospitalizations: Yes Prior psychiatric medication trials: Zyprexa , Prozac    C-SSRS 1) In the past month have you wished you were dead or wished you could go to sleep and not wake up? []  Yes [x]   No 2) In the past month have you actually had any thoughts of killing yourself? []   Yes  [x]   No If YES to 2, ask questions 3, 4, 5, and 6. If NO to 2, go directly to question 6 3) In the past month have you been thinking about how you might do this? []   Yes []   No 4) In the past month have you had these thoughts and had some  intention of acting on them?  []   Yes []   No 5) In the past month have you started to work out or worked out the details of how to kill yourself? Do you intend to carry out this plan? []   Yes []   No 6) Have you ever done anything, started to do anything, or prepared to do anything to end your life? []   Yes [x]   No Otherwise as per HPI above.  PAST MEDICAL HISTORY  Past Medical History:  Diagnosis Date   Schizophrenia [facility])      HOME MEDICATIONS  PTA Medications  Medication Sig   FLUoxetine  (PROZAC ) 20 MG capsule Take 1 capsule (20 mg total) by mouth daily. (Patient not taking: Reported on 07/12/2023)   OLANZapine  (ZYPREXA ) 15 MG tablet Take 1 tablet (15 mg total) by mouth at bedtime. (Patient not taking: Reported on 07/12/2023)   benztropine  (COGENTIN ) 0.5 MG tablet Take 1 tablet (0.5 mg total) by mouth daily. (Patient not taking: Reported on 07/12/2023)     ALLERGIES  No Known Allergies  SOCIAL & SUBSTANCE USE HISTORY  Social History   Socioeconomic History   Marital status: Single    Spouse name: Not on file   Number of children: Not on file   Years of education: Not on file   Highest education level: Not on file  Occupational History   Not on file  Tobacco Use   Smoking status: Some Days    Types: Cigars   Smokeless tobacco: Never  Vaping Use   Vaping status: Some Days   Substances: Nicotine , Flavoring  Substance and Sexual Activity   Alcohol use: Not Currently   Drug use: Yes    Types: Marijuana  Sexual activity: Yes  Other Topics Concern   Not on file  Social History Narrative   Not on file   Social Drivers of Health   Financial Resource Strain: Not on file  Food Insecurity: Not on file  Transportation Needs: Not on file  Physical Activity: Not on file  Stress: Not on file  Social Connections: Not on file   Social History   Tobacco Use  Smoking Status Some Days   Types: Cigars  Smokeless Tobacco Never   Social History   Substance and Sexual Activity   Alcohol Use Not Currently   Social History   Substance and Sexual Activity  Drug Use Yes   Types: Marijuana   Cocaine use 4 times per week, last use yesterday or the day before    FAMILY HISTORY  Family History  Family history unknown: Yes   Family Psychiatric History (if known):  Patient denies    MENTAL STATUS EXAM (MSE)  Mental Status Exam: General Appearance: Fairly Groomed  Orientation:  Full (Time, Place, and Person)  Memory:  Immediate;   Good Recent;   Good  Concentration:  Concentration: Fair  Recall:  Good  Attention  Good  Eye Contact:  Good  Speech:  Clear and Coherent  Language:  Good  Volume:  Normal  Mood: It's spooky but it's normal  Affect:  Constricted  Thought Process:  Linear  Thought Content:  Delusions  Suicidal Thoughts:  No  Homicidal Thoughts:  No  Judgement:  Fair  Insight:  Fair  Psychomotor Activity:  Normal  Akathisia:  NA  Fund of Knowledge:  Good    Assets:  Manufacturing Systems Engineer Social Support  Cognition:  WNL  ADL's:  Intact  AIMS (if indicated):       VITALS  Blood pressure (!) 145/79, pulse (!) 105, temperature (!) 97.4 F (36.3 C), temperature source Oral, resp. rate 20, height 6' (1.829 m), weight 72.6 kg, SpO2 96%.  LABS  No visits with results within 1 Day(s) from this visit.  Latest known visit with results is:  Admission on 10/24/2023, Discharged on 10/24/2023  Component Date Value Ref Range Status   WBC 10/24/2023 7.2  4.0 - 10.5 K/uL Final   RBC 10/24/2023 5.74  4.22 - 5.81 MIL/uL Final   Hemoglobin 10/24/2023 17.0  13.0 - 17.0 g/dL Final   HCT 90/97/7974 50.7  39.0 - 52.0 % Final   MCV 10/24/2023 88.3  80.0 - 100.0 fL Final   MCH 10/24/2023 29.6  26.0 - 34.0 pg Final   MCHC 10/24/2023 33.5  30.0 - 36.0 g/dL Final   RDW 90/97/7974 12.1  11.5 - 15.5 % Final   Platelets 10/24/2023 247  150 - 400 K/uL Final   nRBC 10/24/2023 0.0  0.0 - 0.2 % Final   Neutrophils Relative % 10/24/2023 69  % Final   Neutro Abs  10/24/2023 5.0  1.7 - 7.7 K/uL Final   Lymphocytes Relative 10/24/2023 23  % Final   Lymphs Abs 10/24/2023 1.7  0.7 - 4.0 K/uL Final   Monocytes Relative 10/24/2023 7  % Final   Monocytes Absolute 10/24/2023 0.5  0.1 - 1.0 K/uL Final   Eosinophils Relative 10/24/2023 1  % Final   Eosinophils Absolute 10/24/2023 0.1  0.0 - 0.5 K/uL Final   Basophils Relative 10/24/2023 0  % Final   Basophils Absolute 10/24/2023 0.0  0.0 - 0.1 K/uL Final   Immature Granulocytes 10/24/2023 0  % Final   Abs Immature Granulocytes 10/24/2023  0.01  0.00 - 0.07 K/uL Final   Performed at [facility], [address] [place], [place] [address]   Sodium 10/24/2023 142  135 - 145 mmol/L Final   Potassium 10/24/2023 3.9  3.5 - 5.1 mmol/L Final   Chloride 10/24/2023 104  98 - 111 mmol/L Final   CO2 10/24/2023 28  22 - 32 mmol/L Final   Glucose, Bld 10/24/2023 119 (H)  70 - 99 mg/dL Final   Glucose reference range applies only to samples taken after fasting for at least 8 hours.   BUN 10/24/2023 9  6 - 20 mg/dL Final   Creatinine, Ser 10/24/2023 0.97  0.61 - 1.24 mg/dL Final   Calcium 90/97/7974 8.3 (L)  8.9 - 10.3 mg/dL Final   Total Protein 90/97/7974 5.6 (L)  6.5 - 8.1 g/dL Final   Albumin 90/97/7974 2.9 (L)  3.5 - 5.0 g/dL Final   AST 90/97/7974 21  15 - 41 U/L Final   ALT 10/24/2023 9  0 - 44 U/L Final   Alkaline Phosphatase 10/24/2023 56  38 - 126 U/L Final   Total Bilirubin 10/24/2023 0.3  0.0 - 1.2 mg/dL Final   GFR, Estimated 10/24/2023 >60  >60 mL/min Final   Comment: (NOTE) Calculated using the CKD-EPI Creatinine Equation (2021)    Anion gap 10/24/2023 10  5 - 15 Final   Performed at [facility], [address] [place], [place] [address]    PSYCHIATRIC REVIEW OF SYSTEMS (ROS)  ROS: Notable for the following relevant positive findings: See HPI  Additional findings:      Musculoskeletal: No abnormal movements observed      Gait & Station: Laying/Sitting      Pain Screening:  Denies      Nutrition & Dental Concerns: n/a  RISK FORMULATION/ASSESSMENT  Is the patient experiencing any suicidal or homicidal ideations: No     Protective factors considered for safety management: Future oriented, help seeking, identifies reasons to live, social support   Risk factors/concerns considered for safety management:  Substance abuse/dependence Male gender Psychosis   Is there a safety management plan with the patient and treatment team to minimize risk factors and promote protective factors: Yes           Explain: Medication management; resources for outpatient therapy, psychiatry, substance use, primary care; crisis line information; ED return precautions  Is crisis care placement or psychiatric hospitalization recommended: No     Based on my current evaluation and risk assessment, patient is determined at this time to be at:  Low risk  *RISK ASSESSMENT Risk assessment is a dynamic process; it is possible that this patient's condition, and risk level, may change. This should be re-evaluated and managed over time as appropriate. Please re-consult psychiatric consult services if additional assistance is needed in terms of risk assessment and management. If your team decides to discharge this patient, please advise the patient how to best access emergency psychiatric services, or to call 911, if their condition worsens or they feel unsafe in any way.   Erla JAYSON Rase, MD Telepsychiatry Consult Services

## 2024-01-05 NOTE — ED Notes (Signed)
 Pt provided breakfast tray and additional juice.

## 2024-01-05 NOTE — ED Provider Notes (Signed)
 Psychiatry doctor He does not meet criteria for inpatient psychiatric hospitalization. If ED attending comfortable, recommend olanzapine  5mg  po nightly for delusions. Recommend resources for outpatient substance use treatment (ongoing cocaine use which likely is worsening delusions), outpatient therapy, psychiatry, primary care, crisis line info, ED return precautions.   Did discuss with Calvin and orders were placed for him for outpatient follow-up with [facility].  I did write the prescription for the olanzapine  and patient will be discharged home.      Ernest Ronal BRAVO, MD 01/05/24 [phone]

## 2024-01-05 NOTE — Discharge Instructions (Signed)
 Return to the ER if you develop worsening symptoms or any other concerns but you are cleared by psychiatry for discharge home and then going to restart you on Zyprexa  5 mg at bedtime to help with your symptoms.  You will need to see a psychiatrist to have this further uptitrated or changed depending upon how your symptoms are doing.

## 2024-01-05 NOTE — ED Notes (Signed)
 VOL/Consult Pending/Medication refill

## 2024-01-05 NOTE — ED Notes (Addendum)
 Patient was transferred to the [facility] without incident. Upon arrival, the patient was placed in their assigned room, oriented to the unit, and reviewed unit guidelines/rules with the RN. The patient was informed that any form of physical contact and entering another patient's room are strictly prohibited. For safety, it was explained that all care areas are designed with safety features and are continuously monitored by security cameras. Phone times and visiting hours were reviewed. The patient verbalized understanding, and a verbal safety contract was obtained.  Pt also verbalized to RN that I just needed a prescription, I didn't need all this.  Explained to pt that EDP would not restart psychiatric medications that he had not been taking for some time and that psychiatry would be rounding this morning to assist him with his requests.  Pt verbalizes understanding of all information share.

## 2024-01-05 NOTE — ED Notes (Signed)
 [facility] onscreen with pt.

## 2024-01-05 NOTE — ED Notes (Addendum)
 Meal tray given

## 2024-01-05 NOTE — ED Provider Notes (Signed)
 Emergency Medicine Observation Re-evaluation Note   BP (!) 180/122   Pulse (!) 118   Temp (!) 97.5 F (36.4 C) (Oral)   Resp 20   Ht 6' (1.829 m)   Wt 72.6 kg   SpO2 99%   BMI 21.70 kg/m    ED Course / MDM   No reported events during my shift at the time of this note.   Pt is awaiting dispo from consultants   Ginnie Shams MD    Shams Ginnie, MD 01/05/24 [phone]

## 2024-02-12 ENCOUNTER — Other Ambulatory Visit: Payer: Self-pay

## 2024-02-12 ENCOUNTER — Encounter: Payer: Self-pay | Admitting: Emergency Medicine

## 2024-02-12 ENCOUNTER — Emergency Department
Admission: EM | Admit: 2024-02-12 | Discharge: 2024-02-12 | Disposition: A | Discharge status: Other Acute Inpatient Hospital | Payer: 59 | Attending: Emergency Medicine | Admitting: Emergency Medicine

## 2024-02-12 DIAGNOSIS — D72829 Elevated white blood cell count, unspecified: Secondary | ICD-10-CM | POA: Insufficient documentation

## 2024-02-12 DIAGNOSIS — F200 Paranoid schizophrenia: Secondary | ICD-10-CM | POA: Insufficient documentation

## 2024-02-12 DIAGNOSIS — F17210 Nicotine dependence, cigarettes, uncomplicated: Secondary | ICD-10-CM | POA: Insufficient documentation

## 2024-02-12 DIAGNOSIS — F19951 Other psychoactive substance use, unspecified with psychoactive substance-induced psychotic disorder with hallucinations: Secondary | ICD-10-CM | POA: Insufficient documentation

## 2024-02-12 DIAGNOSIS — R45851 Suicidal ideations: Secondary | ICD-10-CM | POA: Insufficient documentation

## 2024-02-12 DIAGNOSIS — F1410 Cocaine abuse, uncomplicated: Secondary | ICD-10-CM

## 2024-02-12 DIAGNOSIS — F29 Unspecified psychosis not due to a substance or known physiological condition: Secondary | ICD-10-CM

## 2024-02-12 DIAGNOSIS — R45850 Homicidal ideations: Secondary | ICD-10-CM

## 2024-02-12 LAB — RESP PANEL BY RT-PCR (RSV, FLU A&B, COVID)  RVPGX2
Influenza A by PCR: NEGATIVE
Influenza B by PCR: NEGATIVE
Resp Syncytial Virus by PCR: NEGATIVE
SARS Coronavirus 2 by RT PCR: NEGATIVE

## 2024-02-12 LAB — COMPREHENSIVE METABOLIC PANEL WITH GFR
ALT: 24 U/L (ref 0–44)
AST: 33 U/L (ref 15–41)
Albumin: 4.4 g/dL (ref 3.5–5.0)
Alkaline Phosphatase: 87 U/L (ref 38–126)
Anion gap: 12 (ref 5–15)
BUN: 9 mg/dL (ref 6–20)
CO2: 30 mmol/L (ref 22–32)
Calcium: 9.1 mg/dL (ref 8.9–10.3)
Chloride: 96 mmol/L — ABNORMAL LOW (ref 98–111)
Creatinine, Ser: 1.02 mg/dL (ref 0.61–1.24)
GFR, Estimated: >60 mL/min
Glucose, Bld: 102 mg/dL — ABNORMAL HIGH (ref 70–99)
Potassium: 3.4 mmol/L — ABNORMAL LOW (ref 3.5–5.1)
Sodium: 137 mmol/L (ref 135–145)
Total Bilirubin: 0.4 mg/dL (ref 0.0–1.2)
Total Protein: 7.1 g/dL (ref 6.5–8.1)

## 2024-02-12 LAB — CBC
HCT: 51.3 % (ref 39.0–52.0)
Hemoglobin: 17.3 g/dL — ABNORMAL HIGH (ref 13.0–17.0)
MCH: 29.3 pg (ref 26.0–34.0)
MCHC: 33.7 g/dL (ref 30.0–36.0)
MCV: 86.8 fL (ref 80.0–100.0)
Platelets: 267 K/uL (ref 150–400)
RBC: 5.91 MIL/uL — ABNORMAL HIGH (ref 4.22–5.81)
RDW: 13.1 % (ref 11.5–15.5)
WBC: 11.4 K/uL — ABNORMAL HIGH (ref 4.0–10.5)
nRBC: 0 % (ref 0.0–0.2)

## 2024-02-12 LAB — URINE DRUG SCREEN
Amphetamines: NEGATIVE
Barbiturates: NEGATIVE
Benzodiazepines: NEGATIVE
Cocaine: POSITIVE — AB
Fentanyl: NEGATIVE
Methadone Scn, Ur: NEGATIVE
Opiates: NEGATIVE
Tetrahydrocannabinol: POSITIVE — AB

## 2024-02-12 LAB — ETHANOL: Alcohol, Ethyl (B): <15 mg/dL

## 2024-02-12 MED ORDER — OLANZAPINE 5 MG PO TABS
5.0000 mg | ORAL_TABLET | Freq: Every day | ORAL | Status: AC
  Administered 2024-02-12: 5 mg via ORAL
  Filled 2024-02-12: qty 1

## 2024-02-12 MED ORDER — OLANZAPINE 5 MG PO TABS
5.0000 mg | ORAL_TABLET | Freq: Every day | ORAL | Status: DC
  Administered 2024-02-12: 5 mg via ORAL
  Filled 2024-02-12: qty 1

## 2024-02-12 MED ORDER — AMLODIPINE BESYLATE 5 MG PO TABS
5.0000 mg | ORAL_TABLET | Freq: Once | ORAL | Status: AC
  Administered 2024-02-12: 5 mg via ORAL
  Filled 2024-02-12: qty 1

## 2024-02-12 NOTE — ED Notes (Signed)
vol/psych consult ordered/pending.. 

## 2024-02-12 NOTE — BH Assessment (Signed)
 Comprehensive Clinical Assessment (CCA) Note   02/12/2024 Bradley Casey [id number]  Disposition: Dispo pending  The patient demonstrates the following risk factors for suicide: Chronic risk factors for suicide include: psychiatric disorder of schizophrenia . Acute risk factors for suicide include: family or marital conflict. Protective factors for this patient include: positive social support. Considering these factors, the overall suicide risk at this point appears to be low. Patient is not appropriate for outpatient follow up.   Patient is a 33 year old male with a history of schizophrenia who presents voluntarily to [facility] ED for an assessment.Patient reports isolation, hopelessness, guilt, loss of interest to do things they enjoy, fatigue, lack of concentration, worthlessness. Patient denies history of past suicide attempts. Patient has a hx of Substance Abuse: cocaine  Last use was last night  .Patient denies NSSIB, SI, HI, AVH.  Patient identifies his primary stressors as relationship issues with his child's mother. Patient denies history of abuse or trauma. Patient denies current legal problems. Patient is receiving medication management. Patient reports he  takes his medications as prescribed (see MAR) and denies recent medication changes. Patient reports previous inpatient admission but is unable to recall the date he was admitted.  Patient denies access to weapons.   During evaluation pt is in no acute distress. He is alert, oriented x 4, calm, cooperative and attentive. his mood is closed / guarded, depressed, and flat with congruent affect. He has normal speech, and behavior.  Objectively there is no evidence of psychosis/mania or delusional thinking.  Patient is able to converse coherently, goal directed thoughts, no distractibility, or pre-occupation.   He also denies self-harm ,psychosis, and paranoia.  Patient answered question appropriately.       Chief Complaint:  Chief Complaint   Patient presents with   Mental Health Problem   Visit Diagnosis: Schizophrenia     CCA Screening, Triage and Referral (STR)  Patient Reported Information How did you hear about us ? -- [facility] ED)  What Is the Reason for Your Visit/Call Today? Per EDP's note: Pt is a 33 y.o. male with a documented history of schizophrenia, cocaine induced psychotic disorder, and polysubstance abuse.  He presents tonight voluntarily with homicidal ideation and some thoughts of harming himself.  He says he does not think he is having hallucinations but other people of told him he has been having hallucinations.  He said he has not been able to take his Zyprexa  over the last 2 days because he left him with his baby momma.  He thinks that she may be involved with someone else and that has him upset.  He has not been able to give any more details.  He has no medical complaints or concerns.  How Long Has This Been Causing You Problems? 1 wk - 1 month  What Do You Feel Would Help You the Most Today? Treatment for Depression or other mood problem; Stress Management; Medication(s)   Have You Recently Had Any Thoughts About Hurting Yourself? Yes  Are You Planning to Commit Suicide/Harm Yourself At This time? No   Flowsheet Row ED from 02/12/2024 in [facility] Emergency Department at [facility] ED from 01/04/2024 in [facility] Emergency Department at [facility] ED from 10/24/2023 in [facility] Emergency Department at [facility]  C-SSRS RISK CATEGORY No Risk No Risk No Risk    Have you Recently Had Thoughts About Hurting Someone [place]? No  Are You Planning to Harm Someone at This Time? Yes  Explanation: Pt report HI  but did not want to elaborate   Have You Used Any Alcohol or Drugs in the Past 24 Hours? No  How Long Ago Did You Use Drugs or Alcohol? Last night  What Did You Use and How Much?unknown amount of cocaine   Do You Currently Have a Therapist/Psychiatrist? No  Name of  Therapist/Psychiatrist:    Have You Been Recently Discharged From Any Office Practice or Programs? No  Explanation of Discharge From Practice/Program: n/a    CCA Screening Triage Referral Assessment Type of Contact: Face-to-Face  Telemedicine Service Delivery:   Is this Initial or Reassessment?   Date Telepsych consult ordered in [facility]:    Time Telepsych consult ordered in [facility]:    Location of Assessment: [facility] ED  Provider Location: [facility] ED   Collateral Involvement: none   Does Patient Have a Automotive Engineer Guardian? No  Legal Guardian Contact Information: n/a  Copy of Legal Guardianship Form: -- (n/a)  Legal Guardian Notified of Arrival: -- (n/a)  Legal Guardian Notified of Pending Discharge: -- (n/a)  If Minor and Not Living with Parent(s), Who has Custody? n/a  Is CPS involved or ever been involved? Never  Is APS involved or ever been involved? Never   Patient Determined To Be At Risk for Harm To Self or Others Based on Review of Patient Reported Information or Presenting Complaint? Yes, for Self-Harm  Method: No Plan  Availability of Means: No access or NA  Intent: Vague intent or NA  Notification Required: No need or identified person  Additional Information for Danger to Others Potential: -- (n/a)  Additional Comments for Danger to Others Potential: Pt reports HI but did not want to talk about it  Are There Guns or Other Weapons in Your Home? No  Types of Guns/Weapons: Denies acess  Are These Weapons Safely Secured?                            -- (Denies access)  Who Could Verify You Are Able To Have These Secured: Denies access  Do You Have any Outstanding Charges, Pending Court Dates, Parole/Probation? Denies pending legal charges  Contacted To Inform of Risk of Harm To Self or Others: Other: Comment (n/a)    Does Patient Present under Involuntary Commitment? No    [place] of Residence: [place]   Patient Currently Receiving the  Following Services: Medication Management   Determination of Need: Urgent (48 hours)   Options For Referral: Inpatient Hospitalization     CCA Biopsychosocial Patient Reported Schizophrenia/Schizoaffective Diagnosis in Past: No   Strengths: Pt is able to ask for help.   Mental Health Symptoms Depression:  None   Duration of Depressive symptoms:    Mania:  None   Anxiety:   Worrying; Tension   Psychosis:  None   Duration of Psychotic symptoms:    Trauma:  Avoids reminders of event   Obsessions:  N/A   Compulsions:  Repeated behaviors/mental acts; Driven to perform behaviors/acts; Disrupts with routine/functioning; Intended to reduce stress or prevent another outcome; Good insight   Inattention:  N/A   Hyperactivity/Impulsivity:  N/A   Oppositional/Defiant Behaviors:  N/A   Emotional Irregularity:  Potentially harmful impulsivity; Mood lability   Other Mood/Personality Symptoms:  None noted    Mental Status Exam Appearance and self-care  Stature:  Average   Weight:  Average weight   Clothing:  Age-appropriate   Grooming:  Normal   Cosmetic use:  Age appropriate  Posture/gait:  Normal   Motor activity:  Not Remarkable   Sensorium  Attention:  Normal   Concentration:  Normal   Orientation:  X5   Recall/memory:  Normal   Affect and Mood  Affect:  Appropriate   Mood:  Depressed   Relating  Eye contact:  Normal   Facial expression:  Anxious   Attitude toward examiner:  Cooperative   Thought and Language  Speech flow: Clear and Coherent   Thought content:  Appropriate to Mood and Circumstances   Preoccupation:  None   Hallucinations:  Auditory   Organization:  Intact; Goal-directed; Coherent   [employer] of Knowledge:  Average   Intelligence:  Average   Abstraction:  Abstract   Judgement:  Impaired   Reality Testing:  Distorted   Insight:  Gaps   Decision Making:  Impulsive   Social Functioning   Social Maturity:  Impulsive   Social Judgement:  Chief Of Staff; Naive   Stress  Stressors:  Transitions; Housing   Coping Ability:  Exhausted   Skill Deficits:  Decision making; Interpersonal; Self-control   Supports:  Support needed     Religion: Religion/Spirituality Are You A Religious Person?: No How Might This Affect Treatment?: Not assessed  Leisure/Recreation: Leisure / Recreation Do You Have Hobbies?: No  Exercise/Diet: Exercise/Diet Do You Exercise?: No Have You Gained or Lost A Significant Amount of Weight in the Past Six Months?: No Do You Follow a Special Diet?: No Do You Have Any Trouble Sleeping?: No   CCA Employment/Education Employment/Work Situation: Employment / Work Situation Employment Situation: Employed Work Stressors: none reported Patient's Job has Been Impacted by Current Illness: No Has Patient ever Been in Equities Trader?: No  Education: Education Is Patient Currently Attending School?: No Last Grade Completed: 13 (Pt states he did one semester of college after HS) Did You Product Manager?: No Did You Have An Individualized Education Program (IIEP): No Did You Have Any Difficulty At [employer]?: No Patient's Education Has Been Impacted by Current Illness: No   CCA Family/Childhood History Family and Relationship History: Family history Marital status: Single Does patient have children?: Yes How many children?:  (UTA) How is patient's relationship with their children?: UTA  Childhood History:  Childhood History By whom was/is the patient raised?: Mother, Father Did patient suffer any verbal/emotional/physical/sexual abuse as a child?: No Did patient suffer from severe childhood neglect?: No Has patient ever been sexually abused/assaulted/raped as an adolescent or adult?: No Was the patient ever a victim of a crime or a disaster?: No Witnessed domestic violence?: No Has patient been affected by domestic violence as an adult?:  No Description of domestic violence: n/a       CCA Substance Use Alcohol/Drug Use: Alcohol / Drug Use Pain Medications: See MAR Prescriptions: See MAR Over the Counter: See MAR History of alcohol / drug use?: Yes Longest period of sobriety (when/how long): Unsure; pt reports using cocaine last nigt Negative Consequences of Use: Personal relationships, Work / School Withdrawal Symptoms: None                         ASAM's:  Six Dimensions of Multidimensional Assessment  Dimension 1:  Acute Intoxication and/or Withdrawal Potential:   Dimension 1:  Description of individual's past and current experiences of substance use and withdrawal: Pt has a chronic hx of polysubstance abuse  Dimension 2:  Biomedical Conditions and Complications:   Dimension 2:  Description of patient's biomedical conditions  and  complications: Pt denies  Dimension 3:  Emotional, Behavioral, or Cognitive Conditions and Complications:  Dimension 3:  Description of emotional, behavioral, or cognitive conditions and complications: Pt has a hx of schizophreniform and substance induced psychosis  Dimension 4:  Readiness to Change:  Dimension 4:  Description of Readiness to Change criteria: Pt verbalizes motivation to change;  Dimension 5:  Relapse, Continued use, or Continued Problem Potential:  Dimension 5:  Relapse, continued use, or continued problem potential critiera description: Pt recently relapsed  Dimension 6:  Recovery/Living Environment:     ASAM Severity Score: ASAM's Severity Rating Score: 11  ASAM Recommended Level of Treatment: ASAM Recommended Level of Treatment: Level III Residential Treatment   Substance use Disorder (SUD) Substance Use Disorder (SUD)  Checklist Symptoms of Substance Use: Continued use despite having a persistent/recurrent physical/psychological problem caused/exacerbated by use, Continued use despite persistent or recurrent social, interpersonal problems, caused or  exacerbated by use, Presence of craving or strong urge to use, Substance(s) often taken in larger amounts or over longer times than was intended  Recommendations for Services/Supports/Treatments: Recommendations for Services/Supports/Treatments Recommendations For Services/Supports/Treatments: Individual Therapy, Medication Management, Other (Comment), Inpatient Hospitalization (Continuous Assessment at [facility])  Disposition Recommendation per psychiatric provider:  DISPO PENDING    DSM5 Diagnoses: Patient Active Problem List   Diagnosis Date Noted   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

## 2024-02-12 NOTE — ED Notes (Signed)
 Vol Pt disposition Pending

## 2024-02-12 NOTE — ED Notes (Signed)
 ..  EMTALA: REQUIRED DOCUMENTATION COMPLETED AND REVIEWED BY WRITER PRIOR TO PT TRANSFER MD REASSESSMENT EMTALA RN SECTION TRANSFER E-SIGN VS WITHIN REQUIRED TIME
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
## 2024-02-12 NOTE — ED Notes (Signed)
 Meal tray provided. Tray checked for any potential hazards. Pt denies no additional needs at this time.

## 2024-02-12 NOTE — BH Assessment (Signed)
 Patient referral was faxed to:   [facility]  [phone]  [facility]  [phone]  [facility]  [phone]  [facility]  [phone]  [facility]  [phone]  [facility]  [phone]  [facility]  [phone]  [facility]  [phone]  [facility]  [phone]  [facility]  [phone]  [facility]  [phone]  [facility]  [phone]  [facility]  [phone]

## 2024-02-12 NOTE — ED Notes (Signed)
 Pts belongings removed: pink t-shirt, black jacket, blue/orange/red Nike sneakers, pair of black socks, black skinny jeans, pink lighter, iphone cell phone  and burgundy boxers.  Witnessed by Parker NT

## 2024-02-12 NOTE — ED Provider Notes (Signed)
 "  [facility] Provider Note    Event Date/Time   First MD Initiated Contact with Patient 02/12/24 0102     (approximate)   History   Mental Health Problem   HPI Bradley Casey is a 33 y.o. male with a documented history of schizophrenia, cocaine induced psychotic disorder, and polysubstance abuse.  He presents tonight voluntarily with homicidal ideation and some thoughts of harming himself.  He says he does not think he is having hallucinations but other people of told him he has been having hallucinations.  He said he has not been able to take his Zyprexa  over the last 2 days because he left him with his baby momma.  He thinks that she may be involved with someone else and that has him upset.  He has not been able to give any more details.  He has no medical complaints or concerns.     Physical Exam   Triage Vital Signs: ED Triage Vitals  Encounter Vitals Group     BP 02/12/24 0049 (!) 191/119     Girls Systolic BP Percentile --      Girls Diastolic BP Percentile --      Boys Systolic BP Percentile --      Boys Diastolic BP Percentile --      Pulse Rate 02/12/24 0049 (!) 101     Resp 02/12/24 0049 19     Temp 02/12/24 0049 97.7 F (36.5 C)     Temp Source 02/12/24 0049 Oral     SpO2 02/12/24 0049 96 %     Weight 02/12/24 0047 79.4 kg (175 lb)     Height 02/12/24 0047 1.829 m (6')     Head Circumference --      Peak Flow --      Pain Score 02/12/24 0047 0     Pain Loc --      Pain Education --      Exclude from Growth Chart --     Most recent vital signs: Vitals:   02/12/24 0049  BP: (!) 191/119  Pulse: (!) 101  Resp: 19  Temp: 97.7 F (36.5 C)  SpO2: 96%    General: Awake, no distress.  Calm and cooperative. CV:  Good peripheral perfusion.  Resp:  Normal effort. Speaking easily and comfortably, no accessory muscle usage nor intercostal retractions.   Abd:  No distention.  Other:  Patient makes eye contact during our conversation.   He is a bit unclear about the history but seems to be trying to answer questions to the best of his ability.  He admits to a little bit of SI and HI.   ED Results / Procedures / Treatments   Labs (all labs ordered are listed, but only abnormal results are displayed) Labs Reviewed  CBC - Abnormal; Notable for the following components:      Result Value   WBC 11.4 (*)    RBC 5.91 (*)    Hemoglobin 17.3 (*)    All other components within normal limits  COMPREHENSIVE METABOLIC PANEL WITH GFR  ETHANOL  URINE DRUG SCREEN       PROCEDURES:  Critical Care performed: No  Procedures    IMPRESSION / MDM / ASSESSMENT AND PLAN / ED COURSE  I reviewed the triage vital signs and the nursing notes.  Differential diagnosis includes, but is not limited to, medication nonadherence, schizophrenia, adjustment disorder, mood disorder, substance abuse.  Patient's presentation is most consistent with acute presentation with potential threat to life or bodily function.  Labs/studies ordered: As per protocol, I ordered the following labs as part of the patient's medical and psychiatric evaluation:  CBC, CMP, ethanol level, urine drug screen.  Interventions/Medications given:  Medications  OLANZapine  (ZYPREXA ) tablet 5 mg (5 mg Oral Given 02/12/24 0111)    (Note:  hospital course my include additional interventions and/or labs/studies not listed above.)   Reassuring evaluation from a medical perspective and the patient is medically cleared for psychiatric evaluation.  Patient is here voluntarily which we will hold for now.  The patient has been placed in psychiatric observation due to the need to provide a safe environment for the patient while obtaining psychiatric consultation and evaluation, as well as ongoing medical and medication management to treat the patient's condition.  The patient has not been placed under full IVC at this time.   Clinical Course as  of 02/12/24 0249  Mon Feb 12, 2024  0249 Labs notable for cocaine and cannabinoids, otherwise reassuring [CF]    Clinical Course User Index [CF] Gordan Huxley, MD     FINAL CLINICAL IMPRESSION(S) / ED DIAGNOSES   Final diagnoses:  Homicidal ideation  Suicidal ideation     Rx / DC Orders   ED Discharge Orders     None        Note:  This document was prepared using Dragon voice recognition software and may include unintentional dictation errors.   Gordan Huxley, MD 02/12/24 [phone]  "

## 2024-02-12 NOTE — ED Notes (Signed)
 Meal tray provided. Tray checked for potential hazards. Pt denies no other needs at this time.

## 2024-02-12 NOTE — BH Assessment (Addendum)
 PATIENT BED AVAILABLE NOW  Patient has been accepted to [facility].  Patient assigned to [facility] Accepting physician is Dr. Steffan Shingles.  Call report to [phone].  Representative was Intake [facility].   ER Staff is aware of it:  [facility] ER Secretary  Dr. Rexford, ER MD  [facility] Patient's Nurse     Address: [address] [place] [address]  CALL REPORT WHEN TRANSPORTATION ARRIVES

## 2024-02-12 NOTE — ED Notes (Signed)
 Patient given snack at the bedside. No acute needs at this time.

## 2024-02-12 NOTE — BH Assessment (Addendum)
" °  Patient follow-up     [facility]  [phone]  [facility]  [phone]  [facility]   [phone]  [facility]  [phone]  [facility]  [phone]  [facility]  [phone]  [facility]  [phone]  [facility]  [phone]  [facility] Currently under review, facility asking for updated vitals and a Covid screen before consideration [phone]  [facility]  Under review [phone]  [facility] Monk [facility]  [phone]  [facility]  [phone]  [facility]  [phone]       "

## 2024-02-12 NOTE — BH Assessment (Signed)
 Patient was deferred to [facility] for a telepsych assessment. The assigned care coordinator will provide updates regarding the scheduling of the assessment. [facility] coordinator can be reached at [phone] for further information on the timing of the telepsych evaluation.

## 2024-02-12 NOTE — ED Triage Notes (Signed)
 Pt c/o HI and issues with his living situation. Hx schizophrenia - has been unable to take is medications over the past 2 days as he left them at his previous residence. Denies hallucinations. +cocaine and +etoh.
